# Patient Record
Sex: Female | Born: 1935
Health system: Southern US, Community
[De-identification: ages and names within clinical notes are randomized; demographics above are authoritative.]

## PROBLEM LIST (undated history)

## (undated) DIAGNOSIS — Z9889 Other specified postprocedural states: Secondary | ICD-10-CM

## (undated) DIAGNOSIS — G2581 Restless legs syndrome: Secondary | ICD-10-CM

## (undated) DIAGNOSIS — I10 Essential (primary) hypertension: Secondary | ICD-10-CM

## (undated) DIAGNOSIS — E785 Hyperlipidemia, unspecified: Secondary | ICD-10-CM

## (undated) DIAGNOSIS — N83209 Unspecified ovarian cyst, unspecified side: Secondary | ICD-10-CM

## (undated) DIAGNOSIS — I5030 Unspecified diastolic (congestive) heart failure: Secondary | ICD-10-CM

## (undated) DIAGNOSIS — R0989 Other specified symptoms and signs involving the circulatory and respiratory systems: Secondary | ICD-10-CM

## (undated) DIAGNOSIS — H269 Unspecified cataract: Secondary | ICD-10-CM

## (undated) DIAGNOSIS — I4891 Unspecified atrial fibrillation: Secondary | ICD-10-CM

## (undated) DIAGNOSIS — I340 Nonrheumatic mitral (valve) insufficiency: Secondary | ICD-10-CM

## (undated) DIAGNOSIS — N200 Calculus of kidney: Secondary | ICD-10-CM

## (undated) DIAGNOSIS — K639 Disease of intestine, unspecified: Secondary | ICD-10-CM

## (undated) DIAGNOSIS — R9431 Abnormal electrocardiogram [ECG] [EKG]: Secondary | ICD-10-CM

## (undated) HISTORY — DX: Restless legs syndrome: G25.81

## (undated) HISTORY — PX: CATARACT EXTRACTION: SUR2

## (undated) HISTORY — DX: Hyperlipidemia, unspecified: E78.5

## (undated) HISTORY — DX: Disease of intestine, unspecified: K63.9

## (undated) HISTORY — PX: ABDOMINAL ADHESION SURGERY: SHX90

## (undated) HISTORY — DX: Unspecified cataract: H26.9

## (undated) HISTORY — DX: Essential (primary) hypertension: I10

## (undated) HISTORY — PX: EYE SURGERY: SHX253

## (undated) HISTORY — PX: COLON SURGERY: SHX602

## (undated) HISTORY — DX: Other specified postprocedural states: Z98.890

## (undated) HISTORY — PX: PACEMAKER INSERTION: SHX728

## (undated) HISTORY — DX: Calculus of kidney: N20.0

## (undated) HISTORY — DX: Unspecified ovarian cyst, unspecified side: N83.209

## (undated) HISTORY — DX: Unspecified diastolic (congestive) heart failure: I50.30

## (undated) HISTORY — DX: Unspecified atrial fibrillation: I48.91

## (undated) HISTORY — DX: Abnormal electrocardiogram (ECG) (EKG): R94.31

## (undated) HISTORY — DX: Other specified symptoms and signs involving the circulatory and respiratory systems: R09.89

## (undated) HISTORY — DX: Nonrheumatic mitral (valve) insufficiency: I34.0

## (undated) HISTORY — PX: CYSTECTOMY: SUR359

---

## 1998-06-04 ENCOUNTER — Inpatient Hospital Stay (HOSPITAL_COMMUNITY): Admission: AD | Admit: 1998-06-04 | Discharge: 1998-06-11 | Payer: Self-pay | Admitting: Cardiology

## 1998-07-10 ENCOUNTER — Ambulatory Visit (HOSPITAL_COMMUNITY): Admission: RE | Admit: 1998-07-10 | Discharge: 1998-07-10 | Payer: Self-pay | Admitting: Cardiology

## 1999-09-10 ENCOUNTER — Inpatient Hospital Stay (HOSPITAL_COMMUNITY): Admission: EM | Admit: 1999-09-10 | Discharge: 1999-09-15 | Payer: Self-pay | Admitting: Emergency Medicine

## 1999-09-10 ENCOUNTER — Encounter: Payer: Self-pay | Admitting: Emergency Medicine

## 1999-09-11 ENCOUNTER — Encounter: Payer: Self-pay | Admitting: Family Medicine

## 1999-09-20 ENCOUNTER — Encounter: Admission: RE | Admit: 1999-09-20 | Discharge: 1999-09-20 | Payer: Self-pay | Admitting: Family Medicine

## 2000-11-12 ENCOUNTER — Inpatient Hospital Stay (HOSPITAL_COMMUNITY): Admission: RE | Admit: 2000-11-12 | Discharge: 2000-11-14 | Payer: Self-pay | Admitting: Internal Medicine

## 2000-11-12 ENCOUNTER — Encounter: Payer: Self-pay | Admitting: Internal Medicine

## 2000-11-13 ENCOUNTER — Encounter: Payer: Self-pay | Admitting: Internal Medicine

## 2001-03-05 ENCOUNTER — Encounter: Payer: Self-pay | Admitting: Family Medicine

## 2001-03-05 ENCOUNTER — Ambulatory Visit (HOSPITAL_COMMUNITY): Admission: RE | Admit: 2001-03-05 | Discharge: 2001-03-05 | Payer: Self-pay | Admitting: Family Medicine

## 2002-02-01 ENCOUNTER — Encounter: Payer: Self-pay | Admitting: *Deleted

## 2002-02-01 ENCOUNTER — Emergency Department (HOSPITAL_COMMUNITY): Admission: EM | Admit: 2002-02-01 | Discharge: 2002-02-01 | Payer: Self-pay | Admitting: *Deleted

## 2002-05-24 ENCOUNTER — Ambulatory Visit (HOSPITAL_COMMUNITY): Admission: RE | Admit: 2002-05-24 | Discharge: 2002-05-24 | Payer: Self-pay | Admitting: Gastroenterology

## 2002-05-24 ENCOUNTER — Encounter (INDEPENDENT_AMBULATORY_CARE_PROVIDER_SITE_OTHER): Payer: Self-pay | Admitting: Specialist

## 2002-07-28 HISTORY — PX: JOINT REPLACEMENT: SHX530

## 2002-08-10 ENCOUNTER — Encounter: Payer: Self-pay | Admitting: Orthopaedic Surgery

## 2002-08-16 ENCOUNTER — Inpatient Hospital Stay (HOSPITAL_COMMUNITY): Admission: RE | Admit: 2002-08-16 | Discharge: 2002-08-19 | Payer: Self-pay | Admitting: Orthopaedic Surgery

## 2002-08-16 ENCOUNTER — Encounter: Payer: Self-pay | Admitting: Orthopaedic Surgery

## 2002-08-19 ENCOUNTER — Inpatient Hospital Stay (HOSPITAL_COMMUNITY)
Admission: RE | Admit: 2002-08-19 | Discharge: 2002-08-26 | Payer: Self-pay | Admitting: Physical Medicine & Rehabilitation

## 2004-06-12 ENCOUNTER — Ambulatory Visit: Payer: Self-pay | Admitting: Cardiology

## 2004-07-08 ENCOUNTER — Ambulatory Visit (HOSPITAL_COMMUNITY): Admission: RE | Admit: 2004-07-08 | Discharge: 2004-07-08 | Payer: Self-pay | Admitting: Gastroenterology

## 2004-08-24 ENCOUNTER — Ambulatory Visit: Payer: Self-pay | Admitting: Internal Medicine

## 2004-09-26 ENCOUNTER — Ambulatory Visit: Payer: Self-pay | Admitting: Cardiology

## 2004-10-04 ENCOUNTER — Ambulatory Visit: Payer: Self-pay

## 2004-10-22 ENCOUNTER — Ambulatory Visit: Payer: Self-pay | Admitting: Internal Medicine

## 2005-02-10 ENCOUNTER — Ambulatory Visit: Payer: Self-pay

## 2005-05-08 ENCOUNTER — Ambulatory Visit: Payer: Self-pay | Admitting: Internal Medicine

## 2005-08-11 ENCOUNTER — Ambulatory Visit: Payer: Self-pay | Admitting: Internal Medicine

## 2005-11-10 ENCOUNTER — Ambulatory Visit: Payer: Self-pay | Admitting: Internal Medicine

## 2006-01-30 ENCOUNTER — Ambulatory Visit: Payer: Self-pay | Admitting: Internal Medicine

## 2006-04-01 ENCOUNTER — Ambulatory Visit: Payer: Self-pay | Admitting: Cardiology

## 2006-05-01 ENCOUNTER — Ambulatory Visit: Payer: Self-pay | Admitting: Internal Medicine

## 2006-05-04 ENCOUNTER — Ambulatory Visit: Payer: Self-pay

## 2006-05-29 ENCOUNTER — Ambulatory Visit: Payer: Self-pay | Admitting: Internal Medicine

## 2006-06-26 ENCOUNTER — Ambulatory Visit: Payer: Self-pay | Admitting: Internal Medicine

## 2006-07-20 ENCOUNTER — Ambulatory Visit: Payer: Self-pay | Admitting: Internal Medicine

## 2006-08-21 ENCOUNTER — Ambulatory Visit: Payer: Self-pay | Admitting: Internal Medicine

## 2006-09-18 ENCOUNTER — Ambulatory Visit: Payer: Self-pay | Admitting: Internal Medicine

## 2006-10-16 ENCOUNTER — Ambulatory Visit: Payer: Self-pay | Admitting: Internal Medicine

## 2006-11-13 ENCOUNTER — Ambulatory Visit: Payer: Self-pay | Admitting: Internal Medicine

## 2006-12-11 ENCOUNTER — Ambulatory Visit: Payer: Self-pay | Admitting: Internal Medicine

## 2007-01-08 ENCOUNTER — Ambulatory Visit: Payer: Self-pay | Admitting: Internal Medicine

## 2007-02-10 ENCOUNTER — Ambulatory Visit: Payer: Self-pay | Admitting: Internal Medicine

## 2007-03-05 ENCOUNTER — Ambulatory Visit: Payer: Self-pay | Admitting: Internal Medicine

## 2007-04-02 ENCOUNTER — Ambulatory Visit: Payer: Self-pay | Admitting: Internal Medicine

## 2007-04-30 ENCOUNTER — Ambulatory Visit: Payer: Self-pay | Admitting: Internal Medicine

## 2007-05-28 ENCOUNTER — Ambulatory Visit: Payer: Self-pay | Admitting: Internal Medicine

## 2007-06-25 ENCOUNTER — Ambulatory Visit: Payer: Self-pay | Admitting: Internal Medicine

## 2007-07-23 ENCOUNTER — Ambulatory Visit: Payer: Self-pay | Admitting: Internal Medicine

## 2007-08-04 ENCOUNTER — Ambulatory Visit: Payer: Self-pay | Admitting: Cardiology

## 2007-11-19 ENCOUNTER — Ambulatory Visit: Payer: Self-pay | Admitting: Internal Medicine

## 2008-02-18 ENCOUNTER — Ambulatory Visit: Payer: Self-pay | Admitting: Internal Medicine

## 2008-04-14 ENCOUNTER — Ambulatory Visit: Payer: Self-pay | Admitting: Internal Medicine

## 2008-07-15 ENCOUNTER — Ambulatory Visit: Payer: Self-pay | Admitting: Internal Medicine

## 2008-07-28 HISTORY — PX: BREAST SURGERY: SHX581

## 2008-08-02 ENCOUNTER — Ambulatory Visit: Payer: Self-pay | Admitting: Cardiology

## 2008-08-17 DIAGNOSIS — I10 Essential (primary) hypertension: Secondary | ICD-10-CM | POA: Insufficient documentation

## 2008-08-17 DIAGNOSIS — I4891 Unspecified atrial fibrillation: Secondary | ICD-10-CM

## 2008-08-17 DIAGNOSIS — I442 Atrioventricular block, complete: Secondary | ICD-10-CM

## 2008-08-17 DIAGNOSIS — Z950 Presence of cardiac pacemaker: Secondary | ICD-10-CM

## 2008-10-16 ENCOUNTER — Ambulatory Visit: Payer: Self-pay | Admitting: Internal Medicine

## 2008-11-14 ENCOUNTER — Encounter: Payer: Self-pay | Admitting: Internal Medicine

## 2009-01-15 ENCOUNTER — Ambulatory Visit: Payer: Self-pay | Admitting: Internal Medicine

## 2009-03-14 ENCOUNTER — Telehealth: Payer: Self-pay | Admitting: Internal Medicine

## 2009-04-03 ENCOUNTER — Ambulatory Visit: Payer: Self-pay | Admitting: Internal Medicine

## 2009-04-25 ENCOUNTER — Telehealth: Payer: Self-pay | Admitting: Cardiology

## 2009-04-27 ENCOUNTER — Encounter: Payer: Self-pay | Admitting: Internal Medicine

## 2009-05-24 ENCOUNTER — Encounter (INDEPENDENT_AMBULATORY_CARE_PROVIDER_SITE_OTHER): Payer: Self-pay | Admitting: General Surgery

## 2009-05-24 ENCOUNTER — Ambulatory Visit (HOSPITAL_COMMUNITY): Admission: RE | Admit: 2009-05-24 | Discharge: 2009-05-24 | Payer: Self-pay | Admitting: General Surgery

## 2009-07-04 ENCOUNTER — Ambulatory Visit: Payer: Self-pay | Admitting: Internal Medicine

## 2009-08-01 ENCOUNTER — Ambulatory Visit: Payer: Self-pay | Admitting: Cardiology

## 2009-08-01 DIAGNOSIS — I080 Rheumatic disorders of both mitral and aortic valves: Secondary | ICD-10-CM | POA: Insufficient documentation

## 2009-10-03 ENCOUNTER — Ambulatory Visit: Payer: Self-pay | Admitting: Internal Medicine

## 2010-01-02 ENCOUNTER — Ambulatory Visit: Payer: Self-pay | Admitting: Internal Medicine

## 2010-04-03 ENCOUNTER — Ambulatory Visit: Payer: Self-pay | Admitting: Internal Medicine

## 2010-05-24 ENCOUNTER — Encounter (INDEPENDENT_AMBULATORY_CARE_PROVIDER_SITE_OTHER): Payer: Self-pay | Admitting: *Deleted

## 2010-07-09 ENCOUNTER — Ambulatory Visit: Payer: Self-pay | Admitting: Internal Medicine

## 2010-08-21 ENCOUNTER — Encounter: Payer: Self-pay | Admitting: Cardiology

## 2010-08-21 ENCOUNTER — Ambulatory Visit: Admission: RE | Admit: 2010-08-21 | Discharge: 2010-08-21 | Payer: Self-pay | Source: Home / Self Care

## 2010-08-21 DIAGNOSIS — R0989 Other specified symptoms and signs involving the circulatory and respiratory systems: Secondary | ICD-10-CM | POA: Insufficient documentation

## 2010-08-27 NOTE — Assessment & Plan Note (Signed)
Summary: [facility]   Visit Type:  Follow-up Primary Provider:  Dr. Vernon Prey  CC:  Atrial Fibrillation and MR.  History of Present Illness: The patient presents for followup of the above. Since I last saw her she has done well. She did have a breast lumpectomy for treatment of cancer. She has required no further therapy for this. She has been followed in the device clinic. She denies any chest pressure, neck or arm discomfort. She has felt no palpitations and has had no presyncope or syncope. She denies any shortness of breath and has had no PND or orthopnea. She complains of generalized fatigue. Of note her sister died this morning after a chronic illness.  Current Medications (verified): 1)  Warfarin Sodium 5 Mg Tabs (Warfarin Sodium) .... Take As Directed 2)  Furosemide 20 Mg Tabs (Furosemide) .... Once Daily 3)  Cozaar 25 Mg Tabs (Losartan Potassium) .... Take One Tablet Once Daily 4)  Vitamin D3 1000 Unit Tabs (Cholecalciferol) .... 2 By Mouth Daily  Allergies (verified): No Known Drug Allergies  Past History:  Past Medical History: Atrial Fibrillation Hypertension Heart failure-diastolic Carotid bruit Ablation-AV Node (S/P) Hyperlipidemia Colonic adhesions Ovarian cyst QT prolongation with sotalol Nephrolithiasis Moderate mitral regurgitation  Past Surgical History: Status post pacemaker-St. Jude Resection of ovarian cyst Lysis of abdominal adhesions Colonic obstruction secondary to diverticulitis with resection Cataract surgery bilaterally  Review of Systems       As stated in the HPI and negative for all other systems.   Vital Signs:  Patient profile:   77 year old female Height:      66 inches Weight:      143 pounds Pulse rate:   70 / minute Resp:     16 per minute BP sitting:   126 / 70  (right arm)  Vitals Entered By: Marrion Coy, CNA (August 01, 2009 10:26 AM)  Physical Exam  General:  Well developed, well nourished, in no acute  distress. Head:  normocephalic and atraumatic Eyes:  PERRLA/EOM intact; conjunctiva and lids normal. Mouth:  Teeth, gums and palate normal. Oral mucosa normal. Neck:  Neck supple, no JVD. No masses, thyromegaly or abnormal cervical nodes. Chest Wall:  no deformities or breast masses noted Lungs:  Clear bilaterally to auscultation and percussion. Abdomen:  Bowel sounds positive; abdomen soft and non-tender without masses, organomegaly, or hernias noted. No hepatosplenomegaly. Msk:  Back normal, normal gait. Muscle strength and tone normal. Extremities:  No clubbing or cyanosis. Neurologic:  Alert and oriented x 3. Skin:  Intact without lesions or rashes. Cervical Nodes:  no significant adenopathy Axillary Nodes:  no significant adenopathy Inguinal Nodes:  no significant adenopathy Psych:  Normal affect.   Detailed Cardiovascular Exam  Neck    Carotids: Carotids full and equal bilaterally without bruits.      Neck Veins: Normal, no JVD.    Heart    Inspection: no deformities or lifts noted.      Palpation: normal PMI with no thrills palpable.      Auscultation: S1 and S2 within normal limits, no S3, no clicks, no rubs, 3/6 murmur heard at the apex and radiating up the aortic outflow tract as well as slightly to the axilla, no diastolic murmurs  Vascular    Abdominal Aorta: no palpable masses, pulsations, or audible bruits.      Femoral Pulses: normal femoral pulses bilaterally.      Pedal Pulses: normal pedal pulses bilaterally.      Radial Pulses:  normal radial pulses bilaterally.      Peripheral Circulation: no clubbing, cyanosis, or edema noted with normal capillary refill.     EKG  Procedure date:  08/01/2009  Findings:      atrial fibrillation, ventricular pacing 100% capture with normal magnet function  PPM Specifications Following MD:  Sherryl Manges, MD     PPM Vendor:  [employer] Jude     PPM Model Number:  [id number]     PPM Serial Number:  [id number] PPM DOI:  11/12/2000     PPM  Implanting MD:  Sherryl Manges, MD  Lead 1    Location: [facility]     DOI: 11/12/2000     Model #: [id number]     Serial #: [id number]     Status: active   Indications:  AFIB WITH ABLATION   PPM Follow Up Pacer Dependent:  Yes      Episodes Coumadin:  Yes  Parameters Mode:  VVIR     Lower Rate Limit:  70     Upper Rate Limit:  120  Impression & Recommendations:  Problem # 1:  MITRAL REGURGITATION (ICD-396.3) The patient had moderate mitral regurgitation on an echo a few years ago. We had a discussion about this. Given her advanced age she would not consent to surgery to repair this most likely. She is not having any symptoms. Therefore, we will manage this expectantly with no further imaging at this time.  Problem # 2:  ATRIAL FIBRILLATION (ICD-427.31) She tolerates Coumadin and has a pacemaker for management of her rate. I will make no change to her regimen. She has no contraindications to the Coumadin. Orders: EKG w/ Interpretation (93000)  Problem # 3:  HYPERTENSION, BENIGN (ICD-401.1) Her blood pressure is controlled and she will continue the meds as listed.  Problem # 4:  PACEMAKER (ICD-V45.Marland Kitchen01) She he is up-to-date on followup.  Patient Instructions: 1)  Your physician recommends that you schedule a follow-up appointment in: 1 year with Dr Antoine Poche in [place] 2)  Your physician recommends that you continue on your current medications as directed. Please refer to the Current Medication list given to you today.

## 2010-08-27 NOTE — Cardiovascular Report (Signed)
Summary: TTM   TTM   Imported By: Roderic Ovens 08/17/2009 14:57:30  _____________________________________________________________________  External Attachment:    Type:   Image     Comment:   External Document

## 2010-08-27 NOTE — Cardiovascular Report (Signed)
Summary: TTM   TTM   Imported By: Roderic Ovens 02/04/2010 14:51:35  _____________________________________________________________________  External Attachment:    Type:   Image     Comment:   External Document

## 2010-08-27 NOTE — Cardiovascular Report (Signed)
Summary: TTM  TTM   Imported By: Roderic Ovens 10/17/2009 16:17:09  _____________________________________________________________________  External Attachment:    Type:   Image     Comment:   External Document

## 2010-08-27 NOTE — Miscellaneous (Signed)
Summary: dx correction   Clinical Lists Changes  Problems: Changed problem from PACEMAKER (ICD-V45..01) to PACEMAKER, PERMANENT (ICD-V45.01)  changed the incorrect dx code to correct dx code Donna Keene  May 24, 2010 12:51 PM 

## 2010-08-27 NOTE — Cardiovascular Report (Signed)
Summary: TTM   TTM   Imported By: Roderic Ovens 04/17/2010 11:51:53  _____________________________________________________________________  External Attachment:    Type:   Image     Comment:   External Document

## 2010-08-29 ENCOUNTER — Telehealth (INDEPENDENT_AMBULATORY_CARE_PROVIDER_SITE_OTHER): Payer: Self-pay | Admitting: *Deleted

## 2010-08-29 NOTE — Assessment & Plan Note (Signed)
Summary: pc2/sl   Visit Type:  PPM-St. Jude Primary Provider:  Dr. Vernon Prey  CC:  no complaints.  History of Present Illness: [name] is  seen in followup for atrial fibrillation with an uncontrolled ventricular response for which she underwent AV junction ablation and pacemaker implantation.   she continues to do very well cooking daily keeping busy with her housework with limited symptoms.  Problems Prior to Update: 1)  Mitral Regurgitation  (ICD-396.3) 2)  Hypertension, Benign  (ICD-401.1) 3)  Pacemaker, Permanent  (ICD-V45.01) 4)  Atrial Fibrillation  (ICD-427.31) 5)  Av Block, Complete  (ICD-426.0)  Current Medications (verified): 1)  Warfarin Sodium 5 Mg Tabs (Warfarin Sodium) .... Take As Directed 2)  Furosemide 20 Mg Tabs (Furosemide) .... Once Daily 3)  Cozaar 25 Mg Tabs (Losartan Potassium) .... Take One Tablet Once Daily 4)  Vitamin D3 1000 Unit Tabs (Cholecalciferol) .... 2 By Mouth Daily  Allergies (verified): No Known Drug Allergies  Past History:  Past Medical History: Last updated: 08/01/2009 Atrial Fibrillation Hypertension Heart failure-diastolic Carotid bruit Ablation-AV Node (S/P) Hyperlipidemia Colonic adhesions Ovarian cyst QT prolongation with sotalol Nephrolithiasis Moderate mitral regurgitation  Past Surgical History: Last updated: 08/01/2009 Status post pacemaker-St. Jude Resection of ovarian cyst Lysis of abdominal adhesions Colonic obstruction secondary to diverticulitis with resection Cataract surgery bilaterally  Family History: Last updated: 08/17/2008 not available  Social History: Last updated: 08/17/2008 Retired  Tobacco Use - No.   Risk Factors: Smoking Status: never (08/17/2008)  Vital Signs:  Patient profile:   77 year old female Height:      66 inches Weight:      142.50 pounds BMI:     23.08 Pulse rate:   69 / minute BP sitting:   137 / 74  (left arm) Cuff size:   regular  Vitals Entered By: Caralee Ates CMA (July 09, 2010 9:09 AM)  Physical Exam  General:  The patient was alert and oriented in no acute distress. HEENT Normal.  Neck veins were flat, carotids were somewhat delayed Lungs were clear.  Heart sounds were regular with 2/6 systoloic mumrr Abdomen was soft with active bowel sounds. There is no clubbing cyanosis or edema. Skin Warm and dry    PPM Specifications Following MD:  Sherryl Manges, MD     PPM Vendor:  St Jude     PPM Model Number:  [id number]     PPM Serial Number:  [id number] PPM DOI:  11/12/2000     PPM Implanting MD:  Sherryl Manges, MD  Lead 1    Location: [facility]     DOI: 11/12/2000     Model #: [id number]     Serial #: [id number]     Status: active   Indications:  AFIB WITH ABLATION   PPM Follow Up Remote Check?  No Battery Voltage:  2.73 V     Pacer Dependent:  Yes     Right Ventricle  Impedance: 437 ohms, Threshold: 0.625 V at 0.4 msec Auto Capture ER/Polarity: 12.29  Episodes Coumadin:  Yes  Parameters Mode:  VVIR     Lower Rate Limit:  70     Upper Rate Limit:  120 Next Cardiology Appt Due:  06/28/2011 Tech Comments:  No parameter changes.  Device function normal.  TTM's with [facility].  ROV 1 year with Dr. Graciela Husbands. Altha Harm, LPN  July 09, 2010 9:41 AM    Impression & Recommendations:  Problem # 1:  MITRAL REGURGITATION (ICD-396.3) we will paln to check  an echo in 6 months at [facility]  to r/o aoritc stenosis   she is without symptoms at this time  Problem # 2:  PACEMAKER, PERMANENT (ICD-V45.01) Device parameters and data were reviewed and no changes were made;  cell impedance >2  will check on estimated longeveity  Problem # 3:  AV BLOCK, COMPLETE (ICD-426.0) stable Her updated medication list for this problem includes:    Warfarin Sodium 5 Mg Tabs (Warfarin sodium) .Marland Kitchen... Take as directed  Problem # 4:  ATRIAL FIBRILLATION (ICD-427.31) on warfain Her updated medication list for this problem includes:    Warfarin Sodium 5 Mg Tabs (Warfarin sodium)  .Marland Kitchen... Take as directed  Appended Document: pc2/sl    Clinical Lists Changes  Observations: Added new observation of PI CARDIO: Your physician recommends that you continue on your current medications as directed. Please refer to the Current Medication list given to you today. Your physician wants you to follow-up in:  6 months You will receive a reminder letter in the mail two months in advance. If you don't receive a letter, please call our office to schedule the follow-up appointment. (07/09/2010 9:48)       Patient Instructions: 1)  Your physician recommends that you continue on your current medications as directed. Please refer to the Current Medication list given to you today. 2)  Your physician wants you to follow-up in:  6 months You will receive a reminder letter in the mail two months in advance. If you don't receive a letter, please call our office to schedule the follow-up appointment.

## 2010-08-29 NOTE — Cardiovascular Report (Signed)
Summary: Office Visit   Office Visit   Imported By: Roderic Ovens 07/10/2010 16:17:29  _____________________________________________________________________  External Attachment:    Type:   Image     Comment:   External Document

## 2010-08-29 NOTE — Assessment & Plan Note (Signed)
Summary: [facility]   Visit Type:  Follow-up Primary Provider:  Dr. Vernon Prey  CC:  Atrial fibrillation.  History of Present Illness: The patient presents for followup. Since I last saw her she has had no new cardiovascular complaints. She continues to do minimal household chores on a few stairs and sleeping. With this she denies any new symptoms that she has some chronic dyspnea and she has no PND or orthopnea. She doesn't notice any palpitations and has had no presyncope or syncope. She denies any chest pressure, neck or arm discomfort. She has had no weight gain or edema. She tolerates anticoagulation.  Current Medications (verified): 1)  Warfarin Sodium 5 Mg Tabs (Warfarin Sodium) .... Take As Directed 2)  Furosemide 20 Mg Tabs (Furosemide) .... Once Daily 3)  Cozaar 25 Mg Tabs (Losartan Potassium) .... Take One Tablet Once Daily 4)  Vitamin D3 1000 Unit Tabs (Cholecalciferol) .... 2 By Mouth Daily 5)  Vitamin C 500 Mg Tabs (Ascorbic Acid) .Marland Kitchen.. 1 By Mouth Daily 6)  B Complex  Tabs (B Complex Vitamins) .Marland Kitchen.. 1 By Mouth Daily  Allergies (verified): No Known Drug Allergies  Past History:  Past Medical History: Reviewed history from 08/01/2009 and no changes required. Atrial Fibrillation Hypertension Heart failure-diastolic Carotid bruit Ablation-AV Node (S/P) Hyperlipidemia Colonic adhesions Ovarian cyst QT prolongation with sotalol Nephrolithiasis Moderate mitral regurgitation  Past Surgical History: Reviewed history from 08/01/2009 and no changes required. Status post pacemaker-St. Jude Resection of ovarian cyst Lysis of abdominal adhesions Colonic obstruction secondary to diverticulitis with resection Cataract surgery bilaterally  Review of Systems       As stated in the HPI and negative for all other systems.   Vital Signs:  Patient profile:   77 year old female Height:      66 inches Weight:      148 pounds BMI:     23.97 Pulse rate:   69 /  minute Resp:     16 per minute BP sitting:   126 / 70  (right arm)  Vitals Entered By: Marrion Coy, CNA (August 21, 2010 9:49 AM)  Physical Exam  General:  Well developed, well nourished, in no acute distress. Head:  normocephalic and atraumatic Eyes:  PERRLA/EOM intact; conjunctiva and lids normal. Mouth:  Teeth, gums and palate normal. Oral mucosa normal. Neck:  Neck supple, no JVD. No masses, thyromegaly or abnormal cervical nodes. Chest Wall:  no deformities, well-healed pacemaker pocket Lungs:  Clear bilaterally to auscultation and percussion. Abdomen:  Bowel sounds positive; abdomen soft and non-tender without masses, organomegaly, or hernias noted. No hepatosplenomegaly. Msk:  Back normal, normal gait. Muscle strength and tone normal. Extremities:  No clubbing or cyanosis. Neurologic:  Alert and oriented x 3. Skin:  Intact without lesions or rashes. Cervical Nodes:  no significant adenopathy Axillary Nodes:  no significant adenopathy Inguinal Nodes:  no significant adenopathy Psych:  Normal affect.   Detailed Cardiovascular Exam  Neck    Carotids: neck bilateral carotid bruits versus transmitted systolic murmur    Neck Veins: Normal, no JVD.    Heart    Inspection: no deformities or lifts noted.      Palpation: normal PMI with no thrills palpable.      Auscultation: S1 and S2 within normal, no S3, no S4, axillary or holosystolic murmur radiating from the apex, also systolic murmur radiating up the aortic outflow tract and mid peaking, no diastolic murmurs.  Vascular    Abdominal Aorta: no palpable masses, pulsations, or  audible bruits.      Femoral Pulses: normal femoral pulses bilaterally.      Pedal Pulses: normal pedal pulses bilaterally.      Radial Pulses: normal radial pulses bilaterally.      Peripheral Circulation: no clubbing, cyanosis, or edema noted with normal capillary refill.     EKG  Procedure date:  08/21/2010  Findings:      Atrial  fibrillation with ventricular pacing  PPM Specifications Following MD:  Sherryl Manges, MD     PPM Vendor:  St Jude     PPM Model Number:  [id number]     PPM Serial Number:  [id number] PPM DOI:  11/12/2000     PPM Implanting MD:  Sherryl Manges, MD  Lead 1    Location: [facility]     DOI: 11/12/2000     Model #: [id number]     Serial #: [id number]     Status: active   Indications:  AFIB WITH ABLATION   PPM Follow Up Pacer Dependent:  Yes      Episodes Coumadin:  Yes  Parameters Mode:  VVIR     Lower Rate Limit:  70     Upper Rate Limit:  120  Impression & Recommendations:  Problem # 1:  MITRAL REGURGITATION (ICD-396.3) As apposed to the previous conversation, she now says that she would want valve surgery if she had severe MR and was otherwise a candidate.  Therefore,  I will schedule an echo.  I cannot find the last echo which was some years ago. Orders: Echocardiogram (Echo)  Problem # 2:  CAROTID BRUIT (ICD-785.9) I will check carotid dopplers. Orders: Carotid Duplex (Carotid Duplex)  Problem # 3:  ATRIAL FIBRILLATION (ICD-427.31) She tolerate Coumadi and he is paced with AV ablation. Orders: EKG w/ Interpretation (93000)  Problem # 4:  HYPERTENSION, BENIGN (ICD-401.1) Her blood pressure is controlled. Orders: Echocardiogram (Echo)  Patient Instructions: 1)  Your physician recommends that you schedule a follow-up appointment in: 6 months with Dr Antoine Poche in [place] 2)  Your physician recommends that you continue on your current medications as directed. Please refer to the Current Medication list given to you today. 3)  Your physician has requested that you have a carotid duplex. This test is an ultrasound of the carotid arteries in your neck. It looks at blood flow through these arteries that supply the brain with blood. Allow one hour for this exam. There are no restrictions or special instructions. 4)  Your physician has requested that you have an echocardiogram.  Echocardiography is a  painless test that uses sound waves to create images of your heart. It provides your doctor with information about the size and shape of your heart and how well your heart's chambers and valves are working.  This procedure takes approximately one hour. There are no restrictions for this procedure.

## 2010-08-30 ENCOUNTER — Ambulatory Visit (HOSPITAL_COMMUNITY): Payer: Medicare Other | Attending: Cardiology

## 2010-08-30 ENCOUNTER — Encounter: Payer: Self-pay | Admitting: Cardiology

## 2010-08-30 ENCOUNTER — Encounter (INDEPENDENT_AMBULATORY_CARE_PROVIDER_SITE_OTHER): Payer: Medicare Other

## 2010-08-30 DIAGNOSIS — I079 Rheumatic tricuspid valve disease, unspecified: Secondary | ICD-10-CM | POA: Insufficient documentation

## 2010-08-30 DIAGNOSIS — I509 Heart failure, unspecified: Secondary | ICD-10-CM | POA: Insufficient documentation

## 2010-08-30 DIAGNOSIS — I4891 Unspecified atrial fibrillation: Secondary | ICD-10-CM | POA: Insufficient documentation

## 2010-08-30 DIAGNOSIS — E785 Hyperlipidemia, unspecified: Secondary | ICD-10-CM | POA: Insufficient documentation

## 2010-08-30 DIAGNOSIS — I6529 Occlusion and stenosis of unspecified carotid artery: Secondary | ICD-10-CM

## 2010-08-30 DIAGNOSIS — I059 Rheumatic mitral valve disease, unspecified: Secondary | ICD-10-CM | POA: Insufficient documentation

## 2010-08-30 DIAGNOSIS — I10 Essential (primary) hypertension: Secondary | ICD-10-CM | POA: Insufficient documentation

## 2010-08-30 DIAGNOSIS — R0989 Other specified symptoms and signs involving the circulatory and respiratory systems: Secondary | ICD-10-CM

## 2010-08-30 DIAGNOSIS — I379 Nonrheumatic pulmonary valve disorder, unspecified: Secondary | ICD-10-CM | POA: Insufficient documentation

## 2010-09-04 NOTE — Progress Notes (Signed)
Summary: Echo appt  Phone Note Outgoing Call Call back at [facility] Phone [phone]   Call placed by: Stanton Kidney, EMT-P,  August 29, 2010 2:43 PM Action Taken: Phone Call Completed Summary of Call: S/W patient, aware to arrive 30 min prior to appt. Stanton Kidney, EMT-P  August 29, 2010 2:43 PM

## 2010-09-05 ENCOUNTER — Other Ambulatory Visit (HOSPITAL_COMMUNITY): Payer: Self-pay

## 2010-09-05 ENCOUNTER — Encounter (HOSPITAL_COMMUNITY): Payer: Self-pay

## 2010-10-09 ENCOUNTER — Encounter: Payer: Self-pay | Admitting: Internal Medicine

## 2010-10-09 DIAGNOSIS — I4891 Unspecified atrial fibrillation: Secondary | ICD-10-CM

## 2010-10-12 DIAGNOSIS — G2581 Restless legs syndrome: Secondary | ICD-10-CM | POA: Insufficient documentation

## 2010-10-14 ENCOUNTER — Other Ambulatory Visit: Payer: Self-pay | Admitting: Orthopaedic Surgery

## 2010-10-14 DIAGNOSIS — M545 Low back pain: Secondary | ICD-10-CM

## 2010-10-14 DIAGNOSIS — M25551 Pain in right hip: Secondary | ICD-10-CM

## 2010-10-18 ENCOUNTER — Ambulatory Visit
Admission: RE | Admit: 2010-10-18 | Discharge: 2010-10-18 | Disposition: A | Discharge status: Home / Self Care | Payer: Medicare Other | Source: Ambulatory Visit | Attending: Orthopaedic Surgery | Admitting: Orthopaedic Surgery

## 2010-10-18 DIAGNOSIS — M545 Low back pain, unspecified: Secondary | ICD-10-CM

## 2010-10-18 DIAGNOSIS — M25551 Pain in right hip: Secondary | ICD-10-CM

## 2010-10-28 ENCOUNTER — Ambulatory Visit: Payer: Medicare Other | Attending: Orthopaedic Surgery | Admitting: Physical Therapy

## 2010-10-28 DIAGNOSIS — M545 Low back pain, unspecified: Secondary | ICD-10-CM | POA: Insufficient documentation

## 2010-10-28 DIAGNOSIS — M2569 Stiffness of other specified joint, not elsewhere classified: Secondary | ICD-10-CM | POA: Insufficient documentation

## 2010-10-28 DIAGNOSIS — IMO0001 Reserved for inherently not codable concepts without codable children: Secondary | ICD-10-CM | POA: Insufficient documentation

## 2010-10-31 ENCOUNTER — Ambulatory Visit: Payer: Medicare Other | Admitting: Physical Therapy

## 2010-10-31 LAB — PROTIME-INR
INR: 1.24 (ref 0.00–1.49)
Prothrombin Time: 15.5 seconds — ABNORMAL HIGH (ref 11.6–15.2)

## 2010-10-31 LAB — DIFFERENTIAL
Basophils Absolute: 0 10*3/uL (ref 0.0–0.1)
Lymphocytes Relative: 43 % (ref 12–46)
Monocytes Absolute: 0.8 10*3/uL (ref 0.1–1.0)
Neutro Abs: 3.3 10*3/uL (ref 1.7–7.7)
Neutrophils Relative %: 45 % (ref 43–77)

## 2010-10-31 LAB — APTT: aPTT: 31 seconds (ref 24–37)

## 2010-10-31 LAB — BASIC METABOLIC PANEL
Calcium: 9.8 mg/dL (ref 8.4–10.5)
GFR calc Af Amer: >60 mL/min (ref >60)
GFR calc non Af Amer: >60 mL/min (ref >60)
Glucose, Bld: 102 mg/dL — ABNORMAL HIGH (ref 70–99)
Sodium: 136 mEq/L (ref 135–145)

## 2010-10-31 LAB — CBC
Hemoglobin: 11.4 g/dL — ABNORMAL LOW (ref 12.0–15.0)
RDW: 14.5 % (ref 11.5–15.5)

## 2010-11-04 ENCOUNTER — Ambulatory Visit: Payer: Medicare Other | Admitting: Physical Therapy

## 2010-11-07 ENCOUNTER — Ambulatory Visit: Payer: Medicare Other | Admitting: Physical Therapy

## 2010-11-11 ENCOUNTER — Ambulatory Visit: Payer: Medicare Other | Admitting: Physical Therapy

## 2010-11-14 ENCOUNTER — Ambulatory Visit: Payer: Medicare Other | Admitting: Physical Therapy

## 2010-11-18 ENCOUNTER — Ambulatory Visit: Payer: Medicare Other | Admitting: Physical Therapy

## 2010-11-21 ENCOUNTER — Ambulatory Visit: Payer: Medicare Other | Admitting: Physical Therapy

## 2010-11-25 ENCOUNTER — Ambulatory Visit: Payer: Medicare Other | Admitting: Physical Therapy

## 2010-11-28 ENCOUNTER — Ambulatory Visit: Payer: Medicare Other | Attending: Orthopaedic Surgery | Admitting: Physical Therapy

## 2010-11-28 DIAGNOSIS — IMO0001 Reserved for inherently not codable concepts without codable children: Secondary | ICD-10-CM | POA: Insufficient documentation

## 2010-11-28 DIAGNOSIS — M545 Low back pain, unspecified: Secondary | ICD-10-CM | POA: Insufficient documentation

## 2010-11-28 DIAGNOSIS — M2569 Stiffness of other specified joint, not elsewhere classified: Secondary | ICD-10-CM | POA: Insufficient documentation

## 2010-12-02 ENCOUNTER — Ambulatory Visit: Payer: Medicare Other | Admitting: Physical Therapy

## 2010-12-05 ENCOUNTER — Ambulatory Visit: Payer: Medicare Other | Admitting: Physical Therapy

## 2010-12-09 ENCOUNTER — Ambulatory Visit: Payer: Medicare Other | Admitting: Physical Therapy

## 2010-12-10 NOTE — Assessment & Plan Note (Signed)
[facility]                            CARDIOLOGY OFFICE NOTE   SHACOYA, BURKHAMMER                      MRN:          [id number]  DATE:08/02/2008                            DOB:          [birth year]    PRIMARY CARE PHYSICIAN:  Ernestina Penna, MD   REASON FOR PRESENTATION:  Evaluate the patient with atrial fibrillation.   HISTORY OF PRESENT ILLNESS:  The patient is an 77 years old.  She  returns for yearly followup.  She did see Dr. Graciela Husbands in September for  pacemaker followup.  She did have some reprogramming of her device to  improve exercise performance.  Apparently, she has felt relatively well.  She is actually not complaining of shortness of breath that she was  having.  She is able to do some grocery shopping though she gets  fatigued.  She does not do much more than this.  She thinks she has good  quality of life though for her age.  She denies any resting shortness of  breath and does not have any PND or orthopnea.  She is not having any  chest discomfort, neck, or arm discomfort.  She is not having any  palpitations, presyncope, or syncope.   PAST MEDICAL HISTORY:  1. Permanent atrial fibrillation with AV nodal ablation.  2. Permanent pacemaker placement.  3. Dyslipidemia.  4. Hypertension.  5. Resection of ovarian cyst.  6. Colon surgery x2 for resection of adhesions.  7. Nephrolithiasis.  8. Osteoporosis.  9. Right total hip replacement.   REVIEW OF SYSTEMS:  As stated in the HPI and otherwise negative for  other systems.   PHYSICAL EXAMINATION:  GENERAL:  The patient is pleasant and in no  distress.  VITAL SIGNS:  Blood pressure 124/74, heart rate 70 and regular, weight  154 pounds, and body mass index 24.  HEENT:  Eyes unremarkable.  Pupils are equal, round, and reactive to  light.  Fundi not visualized.  Oral mucosa unremarkable.  NECK:  No jugular venous distention at 45 degrees, carotid upstroke  brisk and symmetric, no  bruits, no thyromegaly.  LYMPHATICS:  No adenopathy.  LUNGS:  Clear to auscultation bilaterally.  CHEST:  Well-healed pacemaker pocket.  HEART:  PMI not displaced or sustained, S1 and S2 within normal limits.  No S3.  A 2/6 axillary holosystolic murmur radiating slightly at the  left sternal border, no diastolic murmurs.  ABDOMEN:  Flat, positive bowel sounds, normal in frequency and pitch.  No bruits, no rebound, no guarding.  No midline pulsatile mass.  No  organomegaly.  SKIN:  No rashes.  No nodules.  EXTREMITIES:  Pulses 2+.  No edema.   EKG, atrial fibrillation with ventricular pacing 100% capture.   1. Atrial fibrillation.  The patient is tolerating the Coumadin.  She      tolerates the rhythm.  She has normal pacemaker function. I think      she may have had some improvement in her symptoms with whatever      change was put in place by Dr. Graciela Husbands.  At this time, no  further      cardiovascular testing is suggested.  She will continue on the meds      as listed.  2. Murmur.  The patient has a heart murmur which may represent her      mild tricuspid regurgitation on echo.  No further evaluation is      warranted, today she is asymptomatic and given her advance stage.  3. Dyspnea.  This seems to be improved as above.  She will continue on      the meds as listed.  4. Followup.  She was see Dr. Graciela Husbands in the fall and I will see her      again in January of next year when she is 26.     Rollene Rotunda, MD, [facility]  Electronically Signed    JH/MedQ  DD: 08/02/2008  DT: 08/03/2008  Job #: [id number]   cc:   Ernestina Penna, M.D.

## 2010-12-10 NOTE — Assessment & Plan Note (Signed)
[facility]                         ELECTROPHYSIOLOGY OFFICE NOTE   NAME:HOTLERMarvette, [name]                      MRN:          [id number]  DATE:02/10/2007                            DOB:          January 21, 1917    [name] comes in today.  She is status post AV ablation in the  setting of atrial fibrillation.  She has had a cough for the last couple  of weeks.  She has had no fever.  She has not taken her diuretic,  however, in the last 72 hours, and it turns out as we get to the end of  the day.   She has had a little bit of peripheral edema.   MEDICATIONS:  1. Furosemide 20, which she has not been taking as noted.  2. Cozaar 25.  3. Coumadin.   EXAMINATION:  She was afebrile at 98.4, her blood pressure was 122/74,  pulse was 72.  LUNGS:  Had bibasilar crackles.  NECK VEINS:  Flat.  HEART SOUNDS:  Regular.  EXTREMITIES:  Edema 1+.   Interrogation of her St. Jude Identity pulse generator demonstrates that  she had no intrinsic ventricular rhythm, the impedance was 462, the  AutoCapture was on, battery voltage was 2.75 with a cell impedance of  1.2 kilo ohms.   Her device was not reprogrammed.   IMPRESSION:  1. Atrial fibrillation with an uncontrolled ventricular response.  2. Complete heart block status post atrioventricular ablation.  3. Status post pacer for number 2.  4. Shortness of breath, cough; question bronchitis, question heart      failure.  5. I advised the patient to go back on her diuretic.  I have also      given her a prescription for Augmentin.  Because of potential      interactions with Coumadin, I have asked that she decrease her      Coumadin dose to 2.5 mg for the week that she is taking the      Augmentin.   Will plan to see her again in a year.  She is to follow up with Dr.  Christell Constant in the interim if she has any further problems.     Duke Salvia, MD, [facility]  Electronically Signed    SCK/MedQ  DD: 02/10/2007   DT: 02/11/2007  Job #: [id number]

## 2010-12-10 NOTE — Assessment & Plan Note (Signed)
[facility]                         ELECTROPHYSIOLOGY OFFICE NOTE   NAME:HOTLERYasemin, Rabon                      MRN:          [id number]  DATE:04/14/2008                            DOB:          02-Nov-1916    HISTORY OF PRESENT ILLNESS:  Ms. Huyser is seen in followup for atrial  fibrillation with uncontrolled ventricular response.  She is status post  AV junctional ablation.  She has normal left ventricular function.  Her  complaints are moderate shortness of breath with exertion.   MEDICATIONS:  Include Coumadin, Cozaar, and Furosemide.   PHYSICAL EXAMINATION:  VITAL SIGNS:  Her blood pressure is 130/80, her  pulse is 76.  LUNGS:  Clear.  HEART:  Heart sounds are regular.  EXTREMITIES:  Without peripheral edema.   Interrogation of her St. Jude pulse generator demonstrates no intrinsic  ventricular rhythm, the impendence was 460, and threshold was 0.375 at  0.4.  Battery voltage was 2.74.   IMPRESSION:  1. Atrial fibrillation status post atrioventricular junction ablation.  2. Status post pacer for the above.  3. Diastolic heart failure.   Ms. Hoggard is stable.  We will plan to see her again in 6 months.  Her  device was reprogrammed to see if we could improve exercise performance.     Duke Salvia, MD, [facility]  Electronically Signed    SCK/MedQ  DD: 04/14/2008  DT: 04/15/2008  Job #: [id number]   cc:   Ernestina Penna, M.D.

## 2010-12-10 NOTE — Assessment & Plan Note (Signed)
[facility]                            CARDIOLOGY OFFICE NOTE   NAME:[name], [name]                      MRN:          [id number]  DATE:08/04/2007                            DOB:          [birth year]    PRIMARY:  Dr. Vernon Prey.   REASON FOR PRESENTATION:  Evaluate the patient with shortness of breath  and atrial fibrillation.   HISTORY OF PRESENT ILLNESS:  The patient is now 77 years old.  She has  done quite well since I last saw her.  She says she is feeling a little  bit better since she started a walking regimen.  She walks through her  large house doing laps to get exercise.  She says she is breathing  better.  She is not having any PND or orthopnea.  She is not noticing  any palpitations, pre-syncope, or syncope.  She has had no chest pain.   PAST MEDICAL HISTORY:  Permanent atrial fibrillation with AV node  ablation, permanent pacemaker placement.  Dyslipidemia.  Hypertension.  Resection of an ovarian cyst.  Colon surgery x2 for destruction of  adhesions.   ALLERGIES:  ACCUPRIL caused cough.  SOTALOL caused QT prolongation.  The  patient has been intolerant of low-dose beta blockers and Cardizem.   MEDICATIONS:  1. Coumadin per [facility].  2. Cozaar 25 mg daily.  3. Furosemide 20 mg daily.  4. Vitamin E.  5. Vitamin C.  6. Calcium.   REVIEW OF SYSTEMS:  As stated in the HPI and otherwise negative for  other systems.   PHYSICAL EXAMINATION:  The patient is in no distress.  Blood pressure 130/72, heart rate is 70 and regular.  HEENT:  Eyelids unremarkable.  Pupils are equal, round, and reactive to  light.  Fundi are not visualized.  NECK:  No jugular venous distension at 45 degrees, carotid upstroke  brisk and symmetric, left greater than right carotid bruit.  No  thyromegaly.  LYMPHATICS:  No adenopathy.  LUNGS:  Clear to auscultation bilaterally.  BACK:  No costovertebral angle tenderness.  CHEST:   Unremarkable.  HEART:  PMI not displaced or sustained, S1 and S2 within normal limits,  no S3, no S4, 2/6 axillary holosystolic murmur radiating out the aortic  outflow tract.  No diastolic murmurs.  ABDOMEN:  Flat, positive bowel sounds, normal in frequency and pitch, no  bruits, rebound, guarding.  No midline pulsatile masses, organomegaly.  SKIN:  No rashes, no nodules.  EXTREMITIES:  With 2+ pulses, no edema.   EKG shows atrial fibrillation with 100% ventricular paced beats.   ASSESSMENT AND PLAN:  1. Atrial fibrillation, the patient is doing well with respect to      this.  She has had atrioventricular ablation and a pacemaker that      is functioning.  No further cardiovascular testing is suggested.      She will follow up in 6 months with Dr. Graciela Husbands for pacemaker      followup and does transtelephonic monitoring.  She will remain on      her Coumadin.  2.  Carotid bruit.  This is probable systolic transmitted murmur.  She      had a negative Doppler in 2007.  No further testing is warranted.  3. Followup.  I will see the patient again in 1 year or sooner if      needed.     Rollene Rotunda, MD, [facility]  Electronically Signed    JH/MedQ  DD: 08/04/2007  DT: 08/04/2007  Job #: [id number]   cc:   Ernestina Penna, M.D.

## 2010-12-12 ENCOUNTER — Ambulatory Visit: Payer: Medicare Other | Admitting: *Deleted

## 2010-12-13 NOTE — Procedures (Signed)
[place]. [facility]  Patient:    [name], [name]                      MRN: [id number] Proc. Date: 11/12/00 Adm. Date:  [id number] Attending:  Nathen May                           Procedure Report  PREOPERATIVE DIAGNOSIS:  Atrial fibrillation with a poorly-controlled ventricular response.  POSTOPERATIVE DIAGNOSIS:  Atrial fibrillation with a poorly-controlled ventricular response.  PROCEDURE:  Atrioventricular junction ablation following ventricular pacemaker implantation.  DESCRIPTION OF PROCEDURE:  Following placement of the permanent pacemaker, the patient was then prepared for cardiac catheterization via the femoral site. After standard prep and drape, cardiac catheterization was performed with local anesthesia and conscious sedation.  Noninvasive blood pressure monitoring and oxygen saturation monitoring were performed continuously throughout the procedure.  At the end of the procedure, the catheters were removed, hemostasis was obtained, and the patient was transferred to the intensive care unit in stable condition.  CATHETERS:  A 7 French 4 mm deflectable-tip catheter was inserted via the right femoral vein using a Riley Kill (SR0) sheath to mapping sites at the AV junction.  RESULTS:  Surface electrocardiogram.  Rhythm:  Atrial fibrillation with an RR interval of approximately 850 msec.  Final rhythm:  There was no intrinsic ventricular rhythm with persistence of atrial fibrillation.  The HV interval was 48 msec.  A total of 2 minutes, 59 seconds of RF was applied in nine applications to sites where His potentials were identified.  Initial applications were performed where the AV ratio was as high as 2:3.  No heart block could be accomplished at these AV intervals.  Thereafter, the catheter was moved distally and at the site of final ablation, the AV ratio was approximately 1:5.  At this site, a complete heart block ensued.   Thereafter, it was noted there was no intrinsic rhythm.  Fluoroscopy time:  Five minutes of fluoro was used at 15 frames per second.  IMPRESSION: 1. Successful atrioventricular junction ablation following prior permanent    pacemaker implantation. 2. NO INTRINSIC RHYTHM.  The patient will be observed for 48 hours in hospital, attached to transcutaneous pads for at least 24 hours in the event of ventricular lead dislodgement.  The patients pacemaker was programmed at 90 beats per minute. DD:  11/12/00 TD:  11/13/00 Job: [id number]

## 2010-12-13 NOTE — Op Note (Signed)
   NAME:  [name], [name]                         ACCOUNT NO.:  [id number]   MEDICAL RECORD NO.:  [id number]                   PATIENT TYPE:  AMB   LOCATION:  ENDO                                 FACILITY:  [facility]   PHYSICIAN:  James L. Malon Kindle., M.D.          DATE OF BIRTH:  [birth year]   DATE OF PROCEDURE:  05/24/2002  DATE OF DISCHARGE:                                 OPERATIVE REPORT   PROCEDURE:  Colonoscopy and polypectomy.   MEDICATIONS:  Fentanyl 40 mcg, Versed 4 mg IV.   INDICATIONS:  Strong family history of colonic neoplasia.   ENDOSCOPE:  Olympus pediatric colonoscope.   DESCRIPTION OF PROCEDURE:  The procedure had been explained to the patient  and consent obtained.  With the patient in the left lateral decubitus  position, the scope was inserted and advanced under direct visualization.  The patient had diffuse melanosis coli.  We were able to advance around to  the cecum.  At the tip of the cecum was a 4 mm polyp that was cauterized.  The scope was withdrawn back to the ascending colon.  Another 4 mm polyp was  seen and was cauterized, both with the hot biopsy forceps.  These were  placed in separate jars.  The transverse and descending colon were free of  polyps.  In the sigmoid at approximately 30 cm from the anal verge, a 0.75  cm polyp was encountered, was removed with the snare and sucked through the  scope.  No other polyps were seen.  Again diffuse melanosis coli throughout  the entire colon.  The scope was withdrawn.  The patient tolerated the  procedure well.   ASSESSMENT:  Multiple colon polyps removed.    PLAN:  Routine postpolypectomy instructions.  Will resume Coumadin in five  days.  Will repeat procedure in two years.                                               James L. Malon Kindle., M.D.    Waldron Session  D:  05/24/2002  T:  05/24/2002  Job:  [id number]   cc:   Sheppard Plumber. Earlene Plater, M.D.  [address]  [place]  [place] [address]  Fax: [phone]   Ernestina Penna, M.D.  [address] [place]  [place] [address]  Fax: [phone]

## 2010-12-13 NOTE — Assessment & Plan Note (Signed)
[facility]                              CARDIOLOGY OFFICE NOTE   MAJESTIC, MOLONY                      [name]:          [id number]  DATE:04/01/2006                            DOB:          10-Mar-1917    REFERRING PHYSICIAN:  Ernestina Penna, M.D.   REASON FOR PRESENTATION:  Patient with shortness of breath.  Evaluate  patient with atrial fibrillation.   HISTORY OF PRESENT ILLNESS:  Patient is a pleasant 77 year old with  permanent atrial fibrillation, AV node ablation and permanent pacemaker  placement.  She presents for routine followup.  She says she has done well  from a cardiovascular standpoint.  She denies any new symptoms though she  has some chronic dyspnea with exertion.  She denies any chest pressure, neck  discomfort or arm discomfort.  She has had no palpitations, presyncope or  syncope.  She denies any PND or orthopnea.  She had her pacemaker checked in  July by Dr. Graciela Husbands.   PAST MEDICAL HISTORY:  1. Permanent atrial fibrillation with AV node ablation, permanent      pacemaker placement.  2. Hyperlipidemia.  3. Hypertension.  4. Resection of an ovarian cyst.  5. Colon surgery x2 for obstruction for adhesions.   ALLERGIES:  1. ACCUPRIL CAUSED COUGH.  2. SOTALOL CAUSED Q-T PROLONGATION.  3. THE PATIENT HAS BEEN INTOLERANT OF LOW-DOSE BETA-BLOCKERS AND CARDIZEM.   MEDICATIONS:  1. Coumadin per __________ [facility].  2. Cozaar 25 mg a day.  3. Furosemide 20 mg a day.   REVIEW OF SYSTEMS:  As stated in the HPI, otherwise negative for other  systems.   PHYSICAL EXAMINATION:  GENERAL:  Patient is in no distress.  VITAL SIGNS:  Blood pressure 134/78, heart rate 70 and regular.  HEENT:  Eyes unremarkable.  Pupils equal, round, react to light and fundi  not visualized.Marland Kitchen  NECK:  No jugular venous distention.  Left greater than right carotid bruit.  No thyromegaly.  LYMPHATICS:  No lymphadenopathy.  LUNGS:  Clear to  auscultation bilaterally.  BACK:  No costovertebral angle tenderness.  CHEST:  Unremarkable.  HEART:  PMI not displaced or sustained.  S1 and S2 within normal limits.  No  S3, no S4.  A 2/6 axillary holosystolic murmur radiating out the aortic  outflow tract.  No diastolic murmurs.  ABDOMEN:  Flat, positive bowel sounds.  Normal frequency and pitch.  No  bruits, rebound, guarding or midline pulsatile mass.  No organomegaly.  SKIN:  No rash.  UPPER EXTREMITIES:  2+ pulses.  No edema.   EKG:  Atrial fibrillation with ventricular pacing 100% capture.   ASSESSMENT/PLAN:  1. Atrial fibrillation.  The patient is maintaining Coumadin.  She has AV      node ablation and 100% capture of the pacemaker; rate of 70.  It is      functioning normally.  She will continue with her current regimen.  2. Carotid bruit.  This may be a transient murmur as she had some aortic      calcification but I do not really appreciate the murmur  traveling up      that direction.  I am going to get carotid Dopplers to make sure she      does not have stenosis as currently now the diagnosis is carotid bruit.  3. Mitral regurgitation.  The patient has very mild mitral regurgitation      on echo.  We will follow this clinically.  4. Followup.  I will see her in January 2009.  This will stagger the      visits so that she will see Dr. Graciela Husbands in July of every year and I will      see her in January of every year.                               [name] Rotunda, MD, [facility]    JH/MedQ  DD:  04/01/2006  DT:  04/01/2006  Job #:  [id number]   cc:   Ernestina Penna, M.D.

## 2010-12-13 NOTE — Discharge Summary (Signed)
[place]. [facility]  Patient:    [name], [name]                      MRN: [id number] Adm. Date:  [id number] Disc. Date: [id number] Attending:  Nathen May Dictator:   Shayne Alken, C.R.N.P. CC:         Madolyn Frieze. Jens Som, M.D. [facility]  Nathen May, M.D., [facility]  Rollene Rotunda, M.D. [facility]  Delsa Grana, R.N.   Discharge Summary  DISCHARGE DIAGNOSES: 1. Atrial fibrillation. 2. Bradycardia.  HISTORY OF PRESENT ILLNESS:  This is an 77 year old who was seen in EP consultation for atrial fibrillation that had been refractory to antiarrhythmic drug therapy.  She continues to feel weak and lightheaded and had a Holter monitor placed demonstrating poorly controlled rapid rate, but also any absence of drug therapy.  Some degree of relative bradycardia with the heart rates in the 40s and 50s.  She was intolerant to sodolol with QT prolongation which probably precludes the use of Tikosyn.  She also failed to tolerate cordorone.  She does not have ischemic heart disease at least since 1998.  It was felt that class I CAD is not likely going to be effective in the wake of a Firm trial.  It was discussed with the patient the potential benefits and downsides of rate or rhythm control.  The patient was admitted for an AV nodal ablation and placement of a permanent pacemaker.  HOSPITAL COURSE:  The patient had an Integrity SR St. Jude pacemaker placed on April 18, without difficulty and an AV ablation performed successfully after pacemaker was placed.  The patient tolerated the procedure well and had no postoperative complaints.  She was eventually discharged to home on her previous medication.  DISCHARGE MEDICATIONS: 1. Cozaar 25 mg daily. 2. Coumadin 5 mg daily except Sundays and Saturdays 2.5 mg daily.  ACTIVITY:  She was not to do any heavy lifting or strenuous activity with her left arm for four to six weeks.  Low fat, low cholesterol, no added  salt diet. She was to keep her wound clean and dry, no shower for one week.  Not to raise her arm above her head for one week and gradually raise as depicted on discharge summary.  She was to follow up with the physician assistant at [facility] for wound check on May 1, at 9 a.m. and Dr. Graciela Husbands in two to three months.  The office will call her with an appointment.  She is also to have a PT INR checked at Dr. Buel Ream office on Tuesday, April 23.DD:  12/30/00 TD:  12/30/00 Job: [id number]

## 2010-12-13 NOTE — Discharge Summary (Signed)
NAMEJAHNIYA, [name]                         ACCOUNT NO.:  [id number]   MEDICAL RECORD NO.:  [id number]                   PATIENT TYPE:  INP   LOCATION:  5013                                 FACILITY:  [facility]   PHYSICIAN:  Claude Manges. Cleophas Dunker, M.D.            DATE OF BIRTH:  April 15, 1917   DATE OF ADMISSION:  08/16/2002  DATE OF DISCHARGE:  08/19/2002                                 DISCHARGE SUMMARY   ADMISSION DIAGNOSES:  1. End-stage osteoarthritis, right hip.  2. Hypertension.  3. Atrial fibrillation, on chronic Coumadin.  4. Cardiac pacer.  5. Asthma.  6. Chronic constipation.  7. History of diverticulosis.  8. History of rheumatoid arthritis.   DISCHARGE DIAGNOSES:  1. Right total hip arthroplasty.  2. Postoperative blood loss anemia.  3. Hypertension.  4. Chronic atrial fibrillation with Coumadin therapy.  5. Cardiac pacemaker.  6. History of asthma.  7. Chronic constipation.  8. History of diverticulosis.  9. History of rheumatoid arthritis in hands.   HISTORY OF PRESENT ILLNESS:  The patient is an 77 year old white female who  presents with a three- to four-year history of worsening right hip pain.  The patient describes the pain as an intermittent dull aching over the  greater trochanter region with radiation into the lateral thigh.  It worsens  with any walking.  It improves with rest.  The patient is currently using a  cane to assist ambulation over the last year.  The patient does describe a  catching sensation.   ALLERGIES:  No known drug allergies.   CURRENT MEDICATIONS:  1. Cozaar 25 mg p.o. q.a.m.  2. Coumadin 5 mg on Monday, Wednesdays, Fridays, Sundays; 2.5 mg on     Thursday, Saturdays and Tuesdays.  3. MiraLax 17 g daily.  4. Nature's Tea daily.   SURGICAL PROCEDURE:  On August 16, 2002, the patient was taken to the  operating room by Dr. Claude Manges. [place], assisted by Dr. Lenard Galloway.  [facility] and [facility]. Duffy, P.A.C.  Under general  anesthesia, the patient  underwent a right total hip replacement with a Depuy AML small-stature 15-mm  femoral component, a 32-mm femoral head with a +5 neck length, a 54-mm outer  diameter Pinnacle acetabular 100 series cup with a Marathon acetabular liner  of +4 10 degree liner.  The patient tolerated the procedure well, there were  no complications and the patient was transferred to the recovery room and  then to the orthopedic floor in good condition.   CONSULTATIONS:  The following routine consults were requested:  Physical  therapy, occupational therapy, and rehab.   HOSPITAL COURSE:  On August 16, 2002, the patient was admitted to [facility] to the care of Dr. Norlene Campbell.  The patient was taken to  the OR where a right total hip arthroplasty was performed without any  complications.  The patient was transferred to the recovery room  and then to  the orthopedic floor in good condition, where the patient was started on  Coumadin for routine DVT prophylaxis and due to chronic atrial fibrillation.   The patient then incurred a total of three days' postoperative care on the  orthopedic floor, in which the patient did develop some postoperative blood  loss anemia where her hemoglobin dropped to 8.3.  She was typed and crossed  and transfused two units of packed red blood cells with an improvement of  her hemoglobin to 10.9.  The patient had no side-effects with the  transfusion.   The patient worked well with physical therapy on a daily basis.  Her vital  signs remained stable with only some low-grade temperatures which were self-  resolving.  The patient's wound remained benign for any signs of infection.  Her legs remained neuromotor and vascularly intact.  It was felt that the  patient would benefit significantly from a stay on the rehab unit prior to  being discharged to home.  A rehab consult was requested and the patient was  accepted in transfer to the unit on  postop day #3 in good condition.   LABORATORY DATA:  CBC on January 23rd:  WBC 10.0, hemoglobin 10.9,  hematocrit 32.3, platelets 193,000.  Coagulation studies on January 23rd:  PT was 20.3 with an INR of 1.9.  Routine chemistries on January 21st:  Sodium of 134, potassium of 4.5, glucose 211, BUN 10, creatinine 1.0.  Routine urinalysis on admission was normal.  The patient received a total of  two units of packed red blood cells during her hospitalization.   MEDICATIONS UPON DISCHARGE FROM ORTHOPEDIC FLOOR:  1. Cozaar 25 mg p.o. daily.  2. MiraLax 17 g p.o. daily.  3. Colace 100 mg p.o. b.i.d.  4. Senokot one tablet p.o. b.i.d.  5. Laxative or enema or choice p.r.n.  6. Vicodin one or two tablets every four to six hours p.r.n.  7. Tylenol 650 mg p.o. q.4h. p.r.n.  8. Robaxin 500 mg p.o. q.6h. p.r.n.  9. Restoril 15 mg p.o. q.h.s. p.r.n.  10.      Coumadin 5 mg p.o. daily.   DISCHARGE INSTRUCTIONS ONTO REHABILITATION FLOOR:  1. Medications:  The patient is to resume medications as dispensed on     orthopedic floor.  2. Activity:  The patient may be weightbearing 50% on her right lower     extremity with the use of a walker and close supervision.  3. Diet:  No restrictions.  4. Wound care:  The patient should keep wound clean and dry, have it checked     daily for any signs of infection.  Staples may be removed on postop day     #14.    FOLLOWUP:  The patient should have a followup appointment with Dr. Cleophas Dunker  in his office two weeks from discharge from rehab floor.   CONDITION ON DISCHARGE:  The patient's condition upon discharge to rehab  unit is good.     Jamelle Rushing, P.A.                      Claude Manges. Cleophas Dunker, M.D.    RWK/MEDQ  D:  09/21/2002  T:  09/21/2002  Job:  [id number]

## 2010-12-13 NOTE — Op Note (Signed)
[name], [name]               ACCOUNT NO.:  [id number]   MEDICAL RECORD NO.:  [id number]          PATIENT TYPE:  AMB   LOCATION:  ENDO                         FACILITY:  [facility]   PHYSICIAN:  James L. Malon Kindle., M.D.DATE OF BIRTH:  02-02-1917   DATE OF PROCEDURE:  DATE OF DISCHARGE:                                 OPERATIVE REPORT   Audio too short to transcribe (less than 5 seconds)      JLE/MEDQ  D:  07/08/2004  T:  07/08/2004  Job:  [id number]

## 2010-12-13 NOTE — Discharge Summary (Signed)
[name], [name]                         ACCOUNT NO.:  [id number]   MEDICAL RECORD NO.:  [id number]                   PATIENT TYPE:  IPS   LOCATION:  4144                                 FACILITY:  [facility]   PHYSICIAN:  Ranelle Oyster, M.D.             DATE OF BIRTH:  March 11, 1917   DATE OF ADMISSION:  08/19/2002  DATE OF DISCHARGE:  08/26/2002                                 DISCHARGE SUMMARY   DISCHARGE DIAGNOSES:  1. Right total hip replacement, 08/16/02, secondary to osteoarthritis,     anemia.  2. Paroxysmal atrial fibrillation with pacemaker and chronic Coumadin.  3. Hypertension.  4. Chronic constipation.   HISTORY OF PRESENT ILLNESS:  An 77 year old female with history of chronic  atrial fibrillation and pacemaker on chronic Coumadin, who was admitted 1/20  with end-stage osteoarthritis of the right hip and no relief with  conservative care.  Underwent a right total hip replacement 1/20 per Dr.  Cleophas Dunker, advised 50% weight bearing.  Chronic Coumadin was resumed.  Foley  catheter tube removed 08/17/02.  Anemia 8.3 and transfused two units of  packed red blood cells on 1/22.  Bouts of constipation resolved with  laxative assistance.  Total suspend mobility, moderate assist for  angulation.  Latest INR of 1.9. Hemoglobin 10.9.  Admitted for comprehensive  Rehab program.   PAST MEDICAL HISTORY:  See discharge diagnoses.   PAST SURGICAL HISTORY:  1. Colon surgery times 2.  2. Bilateral cataract surgery.   ALLERGIES:  AMIODARONE.   PRIMARY CARE PHYSICIAN:  Rudi Heap, MD, of [place], [place].   SOCIAL HISTORY:  Denies alcohol or tobacco.  Lives alone in [place].  Independent prior to admission.  Used a cane.  One level home.  Two steps to  entry.  Daughter in area, works five hours a day but plans to take time off  to provide assistance.   MEDICATIONS PRIOR TO ADMISSION:  1. Cozaar 25 mg daily.  2. Coumadin 5 mg Monday, Wednesday, Friday, Sunday; 2.5 mg  Tuesday,     Thursday, Saturday.  3. MiraLax daily.   HOSPITAL COURSE:  Patient did well on Rehabilitation Services with therapies  initiated on a twice a day basis.  The following issues were followed during  patient's rehab course:  1. Pertaining to Ms. Hennessee's right total hip replacement of 08/16/02:     Surgical site healing nicely.  No signs of infection.  She was ambulating     with a walker.  Partial weight bearing with hip precautions.  She will     follow up with Dr. Cleophas Dunker.  2. She remained on chronic Coumadin for her history of atrial fibrillation     with cardiac rate controlled.  Latest INR of 2.9.  She would follow up     with Cardiology Services.  A Home Health Nurse was to be provided for     necessary blood draws.  3. Postoperative anemia remains stable.  Latest hemoglobin of 12.     Hematocrit 35.5.  4. Blood pressure is controlled on Cozaar.  No orthostatic changes noted.  5. She denied any bladder disturbances.  6. She had a history of constipation which was regulated with laxative     assistance.  7. Overall, for her functional mobility, she was independent with activities     of daily living.  Supervision to modified independence for her     ambulation.  Home Health Physical/Occupational therapy and a nurse have     been arranged.   LATEST LABS:  As noted above.  INR 2.9.  Hemoglobin 12.  Hematocrit 35.5.  Sodium 136.  Potassium 4.0.  BUN at 13. Creatinine 0.8.   DISCHARGE MEDICATIONS:  1. Coumadin with latest dose of 3 mg daily  2. Cozaar 5 mg daily.  3. Colace 100 mg twice daily.  4. MiraLax 17 gm at bedtime.  5. Vicodin as needed for pain.   ACTIVITY:  Partial weight bearing with hip precautions.   DIET:  Regular.   WOUND CARE:  Cleanse incision daily with soap and water.   SPECIAL INSTRUCTIONS:  Home Health Nurse for check of INR on Monday, 08/29/02.  Results to [employer].  She should follow up with Dr. Cleophas Dunker of  Orthopedic Services.   Call for appointment.      Mariam Dollar, P.A.                     Ranelle Oyster, M.D.    DA/MEDQ  D:  08/25/2002  T:  08/25/2002  Job:  [id number]   cc:   Ranelle Oyster, M.D.  [address] [place]  [place] [address]  Fax: [phone]   Ernestina Penna, M.D.  [address] [place]  [place] [address]  Fax: [phone]   Rollene Rotunda, M.D. [facility]  [address]  [place]  [place] [address]  Fax: [phone]

## 2010-12-13 NOTE — Discharge Summary (Signed)
[place]. [facility]  Patient:    [name], [name]                      MRN: [id number] Adm. Date:  [id number] Disc. Date: [id number] Attending:  McDiarmid, Leighton Roach. Dictator:   Lyndee Leo. [facility]                           Discharge Summary  HISTORY OF PRESENT ILLNESS:  Patient was admitted on September 10, 1999 for chest pain and rule out MI.  Patient was also seen by Dr. Antoine Poche for atrial fibrillation and has been on amiodarone for that over the past few weeks and actually was scheduled to see Dr. Antoine Poche for planned cardioversion two days after her admission date.  Patients amiodarone was held on admission, _________ long half life and TSH was checked.  Her INR also was elevated.  Her Coumadin was held. Cardiology saw her and went ahead and planned for cardioversion for her, held her Digoxin, amiodarone.  Her chest pain and pressure was much improved after her amiodarone was held.  Her cardiac enzymes were negative.  TSH was 5.221, B-MET as within normal limits.  Patient was successfully cardioverted back to normal sinus rhythm, was started on Nystatin for cholesterol.  Cholesterol was 273, triglycerides 144, HDL 47, LDL 197, SGOT 83, SGPT 18.  Patient was started on Nystatin.  Will monitor her LFTs on an outpatient basis.  Also physical therapy was consulted to work with patient for strengthening while in house.  Patient was placed back on her Coumadin and will continue that for a few weeks after her cardioversion for prophylaxis.  Patient will follow up with her primary M.D. for continued monitoring of her history of atrial fibrillation and to make sure she  does not reconvert.  DISCHARGE DIAGNOSIS:  Atrial fibrillation with successful cardioversion.  DISCHARGE MEDICATIONS: 1. Coumadin 3 mg Tuesday and Saturday and 2.5 mg Monday, Wednesday, Thursday,    Friday, Sunday. 2. Patient was given a prescription for nitroglycerin sublingual. 3. Cozaar 50  mg half a tablet p.o. q.d. 4. Patient will also be started on Nystatin on outpatient basis.  DISCHARGE PLAN:  FOLLOW-UP:  Patient will follow up with primary M.D., Dr. Christell Constant, at [facility], as well as Dr. Antoine Poche from cardiology. DD:  10/16/99 TD:  10/16/99 Job: [id number]

## 2010-12-13 NOTE — H&P (Signed)
NAME:  [name], [name]                         ACCOUNT NO.:  [id number]   MEDICAL RECORD NO.:  [id number]                   PATIENT TYPE:  INP   LOCATION:  NA                                   FACILITY:  [facility]   PHYSICIAN:  Claude Manges. Cleophas Dunker, M.D.            DATE OF BIRTH:  11/03/1916   DATE OF ADMISSION:  DATE OF DISCHARGE:                                HISTORY & PHYSICAL   CHIEF COMPLAINT:  Progressively worsening right worsening right hip pain for  the last three to four years.   HISTORY OF PRESENT ILLNESS:  This 77 year old white female patient presented  to Dr. Cleophas Dunker with a three to four history of gradual onset but  progressively worsening right hip pain. She has had no known injury or prior  surgery to her hip. The pain at this time is described as an intermittent,  dull aching sensation located over the greater trochanter and buttock area  with some radiation distally into her lateral thigh. The patient does  increase with walking on it or occasionally if she lies on that right side.  Pain does decrease if she sits and rests for a while. She has complained of  some problems with the hip catching at times so that she even has to walk  around a little bit before she can actually sit down. There is no  paresthesias, back pain, locking, grinding, giving away, or night pain  associated with the hip. She is not currently taking any medications for  pain. She has been using the cane for the last year because of the hip pain.   ALLERGIES:  No known drug allergies. She does report she is allergic to  feathers and chocolate.   CURRENT MEDICATIONS:  1. Cozaar 25 mg one tablet p.o. daily.  2. Coumadin 5 mg p.o. every Monday, Wednesday, Friday, and Sunday and 2.5 mg     p.o. every Tuesday, Thursday, and Saturday. Her last dose will be     08/12/02.  3. MiraLax 17 g in 8 ounces of water p.o. daily.  4. Nature's Tea taken p.o. daily for constipation.   PAST MEDICAL HISTORY:   She was diagnosed with hypertension about four years  ago. She does have a history of atrial fibrillation for five years and a  pacemaker placed in 4/02. She is on chronic Coumadin therapy for her atrial  fibrillation. She said she had asthma probably about 30 years ago but has  been in remission since that time. She does have problems with asthma with  any respiratory infections. She has chronic constipation due to several  bowel surgeries and possibly a back injury in the past. She does have a  history of diverticulosis and some colon resection because of that. She was  diagnosed with rheumatoid arthritis in her hands at age 50 but received gold  shots and says she has been remission since that time. Her medical  doctor is  Dr. Rudi Heap, and her cardiologist is Dr. Antoine Poche.   She denies any history of diabetes mellitus, thyroid disease, hiatal hernia,  reflux, peptic ulcer disease, or any other chronic medical condition other  than noted previously.   PAST SURGICAL HISTORY:  1. Removal ovarian cyst and appendectomy by Dr. Bobby Rumpf in [place] in     1957.  2. Excision of colon polyp, noncancerous, by Dr. Kendrick Ranch in 1992.  3. Colon surgery for removal of adhesions by Dr. Myrle Sheng in 1996.  4. Cardiac pacemaker by Dr. Graciela Husbands in 2002.  5. Excision bilateral cataracts about five years ago.   Denies any complications from the above mentioned procedures.   SOCIAL HISTORY:  She denies any history of cigarette smoking, alcohol use,  or drug use. She is divorced and has one daughter. She lives by herself in a  one story house with two steps into the main entrance. She retired at age 73  from the [employer].   Her medical doctor is Dr. Rudi Heap in [place], and his phone number is  [phone].   FAMILY HISTORY:  Her mother died at the age of 85 with a fractured pelvis, a  DVT, a stroke, diabetes, and osteoporosis. Her father died at the age of 49  with heart failure and  diabetes. She has two brothers alive, one age 52 and  one age 55, with the older one with a pacemaker. She has one sister alive at  age 93 with colon cancer. Her younger brother did have history of a heart  attack. She had two brothers who passed away, both of lung cancer, one at  age 27 and one at age 22. Her daughter is age 76 with reflux and sciatica  but otherwise alive and healthy.   REVIEW OF SYMPTOMS:  She does have a remote history of nasal polyps being  removed about 50 years ago. She has some nocturia and urinary leakage every  day, and she says she goes about twice a night. She was diagnosed with  rheumatoid arthritis about 30 years ago. She has had very little progression  of the disease since her treatment with gold shots. She does wear glasses.  She does not have a living will, and her power of attorney is her daughter,  Ardeen Fillers. All other systems are negative and noncontributory.   PHYSICAL EXAMINATION:  GENERAL:  Well-developed, well-nourished, thin, white  female in no acute distress. Walks with a slight limp and the use of a cane.  Mood and affect are appropriate. Talks easily with the examiner. Accompanied  by her daughter. Height 5 feet 6 inches, weight 155 pounds. BMI is 24.  VITAL SIGNS:  Temperature 97.3 degrees Fahrenheit, pulse 80, respirations  20, and blood pressure 142/84.  HEENT:  Normocephalic, atraumatic without frontal or maxillary sinus  tenderness to palpation. Conjunctivae is pink. Sclerae anicteric. PERRLA.  EOMs intact. No visible external ear deformities. Hearing grossly intact.  Tympanic membranes pearly gray bilaterally with good light reflex. Nose and  nasal septal midline. Nasal mucosa pink and moist without exudates or polyps  noted. Buccal mucosa pink and moist. Good dentition. Oropharynx without  erythema or exudates. Tongue and uvula midline. Tongue without fasciculations and uvula rises equally with phonation.  NECK:  No visible masses or  lesions noted. Trachea midline. No palpable  lymphadenopathy. No thyromegaly. Carotids +2 bilaterally without bruits.  Full range of motion and nontender to palpation along the cervical spine.  CARDIOVASCULAR:  Heart regular, rate, and rhythm. S1 and S2 present without  rubs, clicks, or murmurs noted.  RESPIRATORY:  Respirations even and unlabored. Breath sounds clear to  auscultation bilaterally without rales or wheezes noted.  ABDOMEN:  Rounded abdominal contour. Bowel sounds present x4 quadrants. Soft  and nontender to palpation without hepatosplenomegaly or CVA tenderness.  Femoral pulses +2 bilaterally. Nontender to palpation along the entire leg  and the vertebral column.  BREASTS/GENITOURINARY/RECTAL/PELVIC:  These exams deferred at this time.  MUSCULOSKELETAL:  No obvious deformities bilateral upper extremities. There  is full range of motion of these extremities without pain. Radial pulses +2  bilaterally. She has full range of motion of her hips, ankles, and toes  bilaterally. DP and PT pulses are +2. Negative Homans and no calf pain with  palpation bilaterally. No lower extremity edema. She is able to flex her  left hip to about 110 degrees with full extension and easy internal and  external rotation that is full. No pain with range of motion of the hip. No  pain with palpation about the left groin or greater trochanter. No real  tenderness to palpation over the right groin or greater trochanter. She can  flex the right hip to about 100 degrees but does cause some pain. She can  fully extend but has to kind of wait for a minute before it will relax out  into full extension. She has almost no internal or external rotation of the  right hip, and this causes the most pain when this attempted. Restricted  abduction because of the pain at this time on the right.  NEUROLOGICAL:  Alert and oriented x3. Cranial nerves II-XII are grossly  intact. Strength 5/5 bilateral upper and lower  extremities. Rapid  alternating movements intact. Deep tendon reflexes 2+ bilateral upper and  lower extremities. Sensation intact to light touch.   RADIOLOGIC FINDINGS:  X-rays taken of her pelvis in 9/03 showed considerable  osteoarthritis in the right hip with a fair amount of osteopenia. There is  no joint space left and a fair amount of sclerosis of the femoral head.   IMPRESSION:  1. End-stage osteoarthritis, right hip.  2. Hypertension.  3. Chronic atrial fibrillation on chronic Coumadin.  4. Cardiac pacemaker.  5. History of asthma.  6. Chronic constipation.  7. History of diverticulosis.  8. History of rheumatoid arthritis in her hands.   PLAN:  Ms. Madrazo will be admitted to [facility] hospital on 08/16/02 where  she undergo a right total hip arthroplasty by Dr. Claude Manges. [place]. She  undergo all the routine preoperative laboratory tests and studies prior to this procedure. If she has any medical issues, we will consult one of the  hospitalist or Dr. Antoine Poche to follow her while she is at the hospital.     Legrand Pitts. Duffy, P.A.                      Claude Manges. Cleophas Dunker, M.D.    KED/MEDQ  D:  08/02/2002  T:  08/02/2002  Job:  [id number]

## 2010-12-13 NOTE — Procedures (Signed)
[place]. [facility]  Patient:    [name], [name]                      MRN: [id number] Proc. Date: 11/12/00 Adm. Date:  [id number] Attending:  Nathen May CC:         Rollene Rotunda, M.D. [facility]  Electrophysiology Laboratory  [place], Attention [facility], [place]   Procedure Report  PREOPERATIVE DIAGNOSIS:  Atrial fibrillation with an uncontrolled ventricular response.  POSTOPERATIVE DIAGNOSIS:  Atrial fibrillation with an uncontrolled ventricular response.  PROCEDURES:  Pacemaker implantation, contrast venography with anticipated atrioventricular junction ablation.  DESCRIPTION OF PROCEDURE:  Following the obtaining of informed consent, the patient was brought to the electrophysiology laboratory and placed on the fluoroscopic table in the supine position.  After routine prep and drape of the left upper chest, intravenous contrast was injected via the left antecubital vein to identify the course and the patency of the extrathoracic left subclavian vein.  This having been accomplished, lidocaine was infiltrated into the prepectoral subclavicular region.  An incision was made and carried down to the layer of the prepectoral fascia using electrocautery. A pocket was formed using electrocautery.  Hemostasis was obtained.  Thereafter, attention was turned to gaining access to the extrathoracic left subclavian vein, which was accomplished without difficulty without the aspiration of air or puncture of the artery.  A guidewire was placed and retained, and a 0 silk suture was placed in a figure-of-eight fashion and allowed to hang loosely.  Thereafter, a 7 French tear-away introducer sheath was placed, through which was passed a Medtronic 5076, 58 cm length lead, serial number [id number]. Under fluoroscopic guidance it was manipulated to the floor of the right ventricle just proximal to the apex, where the bipolar R-wave was  19.5 millivolts, with a pacing impedance of 0.9 volts at 0.5 milliseconds.  The current of threshold was 1.2 MA.  The pacing impedance was 795 Ohms, and there was no diaphragmatic pacing at 10 volts.  With these acceptable parameters recorded, the lad was secured to the prepectoral fascia and attached to a Pacesetter Integrity SR pulse generator, model 5142, serial number [id number].  Intermittent ventricular pacing was identified.  The pocket was copiously irrigated with antibiotic-containing saline solution.  The leads of the pulse generator were then secured to the pocket, secured to the prepectoral fascia, and the wound was closed in three layers in the normal fashion.  The wound was washed, dried, and a benzoin and Steri-Strip dressing was then applied.  Needle counts, sponge counts, and instrument counts were correct at the end of the procedure according to the staff. DD:  11/12/00 TD:  11/13/00 Job: [id number]

## 2010-12-13 NOTE — Op Note (Signed)
[name], [name]               ACCOUNT NO.:  [id number]   MEDICAL RECORD NO.:  [id number]          PATIENT TYPE:  AMB   LOCATION:  ENDO                         FACILITY:  [facility]   PHYSICIAN:  James L. Malon Kindle., M.D.DATE OF BIRTH:  [birth year]   DATE OF PROCEDURE:  07/08/2004  DATE OF DISCHARGE:                                 OPERATIVE REPORT   PROCEDURE:  Colonoscopy.   MEDICATIONS:  Fentanyl 37.5 mcg, Versed 4 mg IV.   INDICATIONS:  Patient with a previous history of adenomatous colon polyps  and a family history of colonic neoplasia.   DESCRIPTION OF PROCEDURE:  The procedure had been explained to the patient,  and consent obtained.  In the left lateral decubitus position, the Olympus  scope was inserted and advanced.  The pediatric adjustable scope was used.  The patient had a somewhat tortuous colon.  We reached the cecum, ileocecal  valve, and appendiceal orifice seen.  The scope was withdrawn to the cecum.  The ascending colon, transverse colon, splenic flexure, descending and  sigmoid colon seen well.  The patient had diffuse melanosis coli but no  colon polyps were seen throughout the entire colon.  There is no significant  diverticular disease.  The scope was withdrawn.  The patient tolerated the  procedure well and was monitored throughout.   ASSESSMENT:  History of colon polyps with negative colonoscopy at this time.  V12.72.   PLAN:  Will recommend yearly Hemoccults and will not recommend a five-year  repeat, since at that time, she will be 77 years old but will repeat only  for specific indications, such as heme-positive stool, anemia, etc.      JLE/MEDQ  D:  07/08/2004  T:  07/08/2004  Job:  [id number]   cc:   Ernestina Penna, M.D.  [address] [place]  [place] [address]  Fax: [phone]

## 2010-12-13 NOTE — Op Note (Signed)
NAME:  [name], [name]                         ACCOUNT NO.:  [id number]   MEDICAL RECORD NO.:  [id number]                   PATIENT TYPE:  INP   LOCATION:  2550                                 FACILITY:  [facility]   PHYSICIAN:  Claude Manges. Cleophas Dunker, M.D.            DATE OF BIRTH:  11/24/1916   DATE OF PROCEDURE:  08/16/2002  DATE OF DISCHARGE:                                 OPERATIVE REPORT   PREOPERATIVE DIAGNOSIS:  End-stage osteoarthritis, right hip.   POSTOPERATIVE DIAGNOSIS:  End-stage osteoarthritis, right hip.   PROCEDURE:  Right total hip replacement.   SURGEON:  Claude Manges. Cleophas Dunker, M.D.   ASSISTANTS:  Lenard Galloway. Chaney Malling, M.D., and Legrand Pitts. Duffy, P.A.   ANESTHESIA:  General orotracheal.   COMPLICATIONS:  None.   COMPONENTS:  DePuy AML small-stature 15 mm femoral component, i.e., MMA.  A  32 mm femoral head with a +5 neck.  A 54 mm outer diameter Pinnacle  acetabular 100 Series cup with the Marathon acetabular liner, +4 10 degree  liner.   DESCRIPTION OF PROCEDURE:  With the patient comfortable on the operating  table and under general orotracheal anesthesia, the patient was placed in  the lateral decubitus position after inserting the Foley catheter.  The  patient was secured to the operating table with the Innomed hip system.  The  right hip was then prepped with Betadine scrub and then Duraprep from the  iliac crest to below the knee.  Sterile draping was performed.   A routine southern incision was utilized and by sharp dissection carried  down to subcutaneous tissue.  Small bleeders were Bovie coagulated.  Adipose  was incised with the Bovie, self-retaining retractors were inserted.  The  iliotibial band was then incised along the length of the skin incision.   With the  hip internally rotated, the short external rotators were  identified, tendinous structures were tagged with 0 Tycron suture.  These  were then incised with the Bovie from the posterior  attachment to the  greater trochanter.  The capsule was identified and incised along the  femoral neck and head.  There was a clear yellow joint effusion and abundant  beefy-red synovial tissue.  A synovectomy was performed.   The head was then dislocated posteriorly.  Using the AML guide, the calcar  was incised.  The femoral head and neck were then removed from the hip.  The  femoral head was devoid of articular cartilage and misshapen.   Retractors were then inserted.  A starter hole was then made in the  piriformis fossa.  Reaming was performed to 14.5 mm to accept a 15 mm  prosthesis, which had been templated preoperatively.  Sequential rasping was  then performed to 15 mm medial modified aspect.  A calcar reamer was  inserted to obtain the appropriate angle in the calcar.   Retractor was then placed about the acetabulum and further synovectomy was  performed.  The labrum was incised.  Reaming was performed to 53 mm to  accept a 54 mm prosthesis.  We had an excellent ream with nice bleeding.  We  trialled a 52 and a 54 outer diameter acetabular component and felt that we  had excellent rim fit.  With the 54 it would not completely seat.   The Pinnacle 100 Series acetabular component was then impacted flush in a  neutral position.  It was seated completely and was perfectly tight.   The apex hole eliminator was then inserted, followed by the trial  polyethylene component +4 with a 10 degree posterior lip.  The 15 medial  modified aspect rasp was then inserted, and we trialled a +5 femoral neck  and the 32 mm hip ball.  The entire construct was reduced with excellent  range of motion, and there was absolutely no instability and we felt leg  lengths were perfectly equal, as they were preoperatively.   The trial components were removed.  The Marathon +4 10 degree lip liner was  then impacted.  The wound was irrigated throughout the procedure with  antibiotic and saline solution.   The femoral component was then impacted  flush on the calcar in about 15 degrees of anteversion, and the final  femoral head was then impacted.  The entire construct was reduced, again  with excellent range of motion, no instability, and maintenance of normal  leg lengths.   The wound was again irrigated with saline solution and antibiotic solution.  The capsule was closed anatomically with a 0 Tycron.  The short external  rotators were reapproximated anatomically with the same material.  The  iliotibial band was closed with a running 0 Vicryl, the subcu closed in  several layers with 0 and 2-0 Vicryl and skin clips.  A sterile bulky  dressing was applied.  The patient was placed in the supine position and a  knee immobilizer applied.  She was then awoken and returned to the  postanesthesia in satisfactory condition.                                               Claude Manges. Cleophas Dunker, M.D.    PWW/MEDQ  D:  08/16/2002  T:  08/16/2002  Job:  [id number]

## 2010-12-16 ENCOUNTER — Ambulatory Visit: Payer: Medicare Other | Admitting: Physical Therapy

## 2010-12-19 ENCOUNTER — Ambulatory Visit: Payer: Medicare Other | Admitting: *Deleted

## 2010-12-26 ENCOUNTER — Ambulatory Visit: Payer: Medicare Other | Admitting: *Deleted

## 2010-12-30 ENCOUNTER — Ambulatory Visit: Payer: Medicare Other | Attending: Orthopaedic Surgery | Admitting: Physical Therapy

## 2010-12-30 DIAGNOSIS — M545 Low back pain, unspecified: Secondary | ICD-10-CM | POA: Insufficient documentation

## 2010-12-30 DIAGNOSIS — M2569 Stiffness of other specified joint, not elsewhere classified: Secondary | ICD-10-CM | POA: Insufficient documentation

## 2010-12-30 DIAGNOSIS — IMO0001 Reserved for inherently not codable concepts without codable children: Secondary | ICD-10-CM | POA: Insufficient documentation

## 2011-01-02 ENCOUNTER — Ambulatory Visit: Payer: Medicare Other | Admitting: *Deleted

## 2011-01-06 ENCOUNTER — Ambulatory Visit: Payer: Medicare Other

## 2011-01-08 ENCOUNTER — Encounter: Payer: Self-pay | Admitting: Internal Medicine

## 2011-01-08 DIAGNOSIS — I495 Sick sinus syndrome: Secondary | ICD-10-CM

## 2011-01-09 ENCOUNTER — Ambulatory Visit: Payer: Medicare Other | Admitting: Physical Therapy

## 2011-01-13 ENCOUNTER — Ambulatory Visit: Payer: Medicare Other | Admitting: Physical Therapy

## 2011-01-16 ENCOUNTER — Ambulatory Visit: Payer: Medicare Other | Admitting: *Deleted

## 2011-01-20 ENCOUNTER — Ambulatory Visit: Payer: Medicare Other | Admitting: Physical Therapy

## 2011-03-04 ENCOUNTER — Encounter: Payer: Self-pay | Admitting: Cardiology

## 2011-03-25 ENCOUNTER — Encounter: Payer: Self-pay | Admitting: *Deleted

## 2011-04-07 ENCOUNTER — Encounter: Payer: Self-pay | Admitting: Internal Medicine

## 2011-04-09 ENCOUNTER — Encounter: Payer: Self-pay | Admitting: Internal Medicine

## 2011-04-09 DIAGNOSIS — I495 Sick sinus syndrome: Secondary | ICD-10-CM

## 2011-05-05 ENCOUNTER — Telehealth: Payer: Self-pay | Admitting: Internal Medicine

## 2011-05-05 NOTE — Telephone Encounter (Signed)
Pt daughter calling regarding pt device checks. Pt received letter in mail stating that device had not been checked in the recommended period of time. Pt said she has been checking device through company every 3 months and wanted to make sure we were receiving transmissions. Please return pt daughter call to discuss further.

## 2011-05-05 NOTE — Telephone Encounter (Signed)
Spoke with daughter and waiting to schedule pt in January once schedule comes out.

## 2011-07-07 ENCOUNTER — Other Ambulatory Visit: Payer: Self-pay | Admitting: Family Medicine

## 2011-07-07 DIAGNOSIS — R194 Change in bowel habit: Secondary | ICD-10-CM

## 2011-07-11 ENCOUNTER — Ambulatory Visit (INDEPENDENT_AMBULATORY_CARE_PROVIDER_SITE_OTHER): Payer: Medicare Other | Admitting: Internal Medicine

## 2011-07-11 ENCOUNTER — Ambulatory Visit
Admission: RE | Admit: 2011-07-11 | Discharge: 2011-07-11 | Disposition: A | Discharge status: Home / Self Care | Payer: Medicare Other | Source: Ambulatory Visit | Attending: Family Medicine | Admitting: Family Medicine

## 2011-07-11 ENCOUNTER — Encounter: Payer: Self-pay | Admitting: Internal Medicine

## 2011-07-11 DIAGNOSIS — I442 Atrioventricular block, complete: Secondary | ICD-10-CM

## 2011-07-11 DIAGNOSIS — Z950 Presence of cardiac pacemaker: Secondary | ICD-10-CM

## 2011-07-11 DIAGNOSIS — R194 Change in bowel habit: Secondary | ICD-10-CM

## 2011-07-11 DIAGNOSIS — I4891 Unspecified atrial fibrillation: Secondary | ICD-10-CM

## 2011-07-11 LAB — PACEMAKER DEVICE OBSERVATION
BATTERY VOLTAGE: 2.69 V
BMOD-0002RV: 9
BRDY-0004RV: 120 {beats}/min

## 2011-07-11 MED ORDER — IOHEXOL 300 MG/ML  SOLN
100.0000 mL | Freq: Once | INTRAMUSCULAR | Status: AC | PRN
  Administered 2011-07-11: 100 mL via INTRAVENOUS

## 2011-07-11 NOTE — Patient Instructions (Signed)
Your physician recommends that you continue on your current medications as directed. Please refer to the Current Medication list given to you today.  Your physician wants you to follow-up in: 1 year. You will receive a reminder letter in the mail two months in advance. If you don't receive a letter, please call our office to schedule the follow-up appointment.  

## 2011-07-11 NOTE — Progress Notes (Signed)
  HPI  [name] is a 77 y.o. female Seen in followup for atrial fibrillation with an uncontrolled ventricular response for which she underwent AV junction ablation and pacemaker implantation.   The patient denies chest pain, shortness of breath, nocturnal dyspnea, orthopnea or peripheral edema.  There have been no palpitations, lightheadedness or syncope.   she is struggling with constipation    Past Medical History  Diagnosis Date  . Atrial fibrillation   . Hypertension   . Heart failure, diastolic   . Carotid bruit   . S/P AV nodal ablation   . Hyperlipidemia   . Ovarian cyst   . Colon disorder     Adhesions  . QT prolongation     With solatol  . Nephrolithiasis   . Mitral regurgitation     Moderate    Past Surgical History  Procedure Date  . Pacemaker insertion     St. Jude  . Cystectomy     Ovarian  . Abdominal adhesion surgery   . Cataract extraction     Bilaterally  . Colon surgery     Colonic obstruction secondary to diverticulitis with resection    Current Outpatient Prescriptions  Medication Sig Dispense Refill  . b complex vitamins tablet Take 1 tablet by mouth daily.        . Cholecalciferol (VITAMIN D3) 1000 UNITS tablet Take 2,000 Units by mouth daily.        . furosemide (LASIX) 20 MG tablet Take 20 mg by mouth daily.        Marland Kitchen losartan (COZAAR) 50 MG tablet Take 25 mg by mouth daily.       . vitamin C (ASCORBIC ACID) 500 MG tablet Take 500 mg by mouth daily.        Marland Kitchen warfarin (COUMADIN) 5 MG tablet Take 5 mg by mouth daily.          Allergies  Allergen Reactions  . Actonel     unknown  . Atorvastatin     unknown  . Boniva (Ibandronate Sodium)     unknown  . Chocolate     unknown  . Lovastatin     unknown  . Pravastatin Sodium     unknown  . Toprol Xl (Metoprolol Succinate)     unknown  . Visken (Pindolol)     unknown  . Welchol (Colesevelam Hcl)     unknown    Review of Systems negative except from HPI and PMH  Physical  Exam Well developed and well nourished in no acute distress HENT normal E scleral and icterus clear Neck Supple JVP flat; carotids brisk and full Clear to ausculation Regular rate and rhythm, 3/6  murmurs radiating to apex  Soft with active bowel sounds No clubbing cyanosis none Edema Alert and oriented, grossly normal motor and sensory function Skin Warm and Dry  ECG Atrial fibrillation with complete heart block and ventricular pacing  Assessment and  Plan

## 2011-07-11 NOTE — Assessment & Plan Note (Signed)
The patient's device was interrogated.  The information was reviewed. No changes were made in the programming.    

## 2011-07-11 NOTE — Assessment & Plan Note (Signed)
Rate controlled of the AV junction ablation. On warfarin

## 2011-07-11 NOTE — Assessment & Plan Note (Signed)
Stable post device implantation 

## 2011-08-01 DIAGNOSIS — R198 Other specified symptoms and signs involving the digestive system and abdomen: Secondary | ICD-10-CM | POA: Diagnosis not present

## 2011-08-06 ENCOUNTER — Ambulatory Visit (INDEPENDENT_AMBULATORY_CARE_PROVIDER_SITE_OTHER): Payer: Medicare Other | Admitting: Cardiology

## 2011-08-06 ENCOUNTER — Encounter: Payer: Self-pay | Admitting: Cardiology

## 2011-08-06 DIAGNOSIS — I080 Rheumatic disorders of both mitral and aortic valves: Secondary | ICD-10-CM | POA: Diagnosis not present

## 2011-08-06 DIAGNOSIS — R0989 Other specified symptoms and signs involving the circulatory and respiratory systems: Secondary | ICD-10-CM

## 2011-08-06 DIAGNOSIS — I4891 Unspecified atrial fibrillation: Secondary | ICD-10-CM | POA: Diagnosis not present

## 2011-08-06 DIAGNOSIS — I10 Essential (primary) hypertension: Secondary | ICD-10-CM

## 2011-08-06 NOTE — Assessment & Plan Note (Signed)
She tolerates anticoagulation. No change in therapy is indicated.

## 2011-08-06 NOTE — Progress Notes (Signed)
HPI The patient presents for one-year followup. Since I last saw her she has had no new cardiovascular complaints. She denies any chest pressure, neck or arm discomfort. She's had no new shortness of breath, PND or orthopnea. She notices no palpitations, presyncope or syncope.  Allergies  Allergen Reactions  . Actonel     unknown  . Atorvastatin     unknown  . Boniva (Ibandronate Sodium)     unknown  . Chocolate     unknown  . Lovastatin     unknown  . Pravastatin Sodium     unknown  . Toprol Xl (Metoprolol Succinate)     unknown  . Visken (Pindolol)     unknown  . Welchol (Colesevelam Hcl)     unknown    Current Outpatient Prescriptions  Medication Sig Dispense Refill  . b complex vitamins tablet Take 1 tablet by mouth daily.        . Cholecalciferol (VITAMIN D3) 1000 UNITS tablet Take 2,000 Units by mouth daily.        . furosemide (LASIX) 20 MG tablet Take 20 mg by mouth daily.        Marland Kitchen losartan (COZAAR) 50 MG tablet Take 1/2 tab daily      . vitamin C (ASCORBIC ACID) 500 MG tablet Take 500 mg by mouth daily.        . vitamin E 400 UNIT capsule Take 400 Units by mouth daily.      Marland Kitchen warfarin (COUMADIN) 5 MG tablet Take 5 mg by mouth daily.          Past Medical History  Diagnosis Date  . Atrial fibrillation   . Hypertension   . Heart failure, diastolic   . Carotid bruit   . S/P AV nodal ablation   . Hyperlipidemia   . Ovarian cyst   . Colon disorder     Adhesions  . QT prolongation     With solatol  . Nephrolithiasis   . Mitral regurgitation     Moderate    Past Surgical History  Procedure Date  . Pacemaker insertion     St. Jude  . Cystectomy     Ovarian  . Abdominal adhesion surgery   . Cataract extraction     Bilaterally  . Colon surgery     Colonic obstruction secondary to diverticulitis with resection    ROS:  Constipated.  Otherwise as stated in the HPI and negative for all other systems.  PHYSICAL EXAM BP 139/80  Pulse 72  Ht 5\' 6"   (1.676 m)  Wt 144 lb (65.318 kg)  BMI 23.24 kg/m2 GENERAL:  Well appearing HEENT:  Pupils equal round and reactive, fundi not visualized, oral mucosa unremarkable NECK:  No jugular venous distention, waveform within normal limits, carotid upstroke brisk and symmetric, no bruits, no thyromegaly LYMPHATICS:  No cervical, inguinal adenopathy LUNGS:  Clear to auscultation bilaterally BACK:  No CVA tenderness CHEST:  Well healed pacer pocket. HEART:  PMI not displaced or sustained,S1 and S2 within normal limits, no S3, clicks, no rubs, apical holosystolic murmur to the axilla ABD:  Flat, positive bowel sounds normal in frequency in pitch, no bruits, no rebound, no guarding, no midline pulsatile mass, no hepatomegaly, no splenomegaly EXT:  2 plus pulses throughout, no edema, no cyanosis no clubbing SKIN:  No rashes no nodules NEURO:  Cranial nerves II through XII grossly intact, motor grossly intact throughout PSYCH:  Cognitively intact, oriented to person place and time  ASSESSMENT AND PLAN

## 2011-08-06 NOTE — Assessment & Plan Note (Signed)
We discussed this again at this appointment. She would not want surgical treatment of this or referral for any extraordinary percutaneous procedures. She's not having any symptoms. No further imaging is indicated.

## 2011-08-06 NOTE — Patient Instructions (Signed)
Continue current medications as listed.  Follow up in 1 year with Dr Hochrein.  You will receive a letter in the mail 2 months before you are due.  Please call us when you receive this letter to schedule your follow up appointment.  

## 2011-08-06 NOTE — Assessment & Plan Note (Signed)
The blood pressure is at target. No change in medications is indicated. We will continue with therapeutic lifestyle changes (TLC).  

## 2011-08-06 NOTE — Assessment & Plan Note (Signed)
I reviewed carotid Dopplers that she had done last February and they were normal. No change in therapy is indicated.

## 2011-08-14 ENCOUNTER — Encounter: Payer: Self-pay | Admitting: Cardiology

## 2011-08-14 DIAGNOSIS — I4891 Unspecified atrial fibrillation: Secondary | ICD-10-CM | POA: Diagnosis not present

## 2011-08-14 DIAGNOSIS — E785 Hyperlipidemia, unspecified: Secondary | ICD-10-CM | POA: Diagnosis not present

## 2011-08-14 DIAGNOSIS — D0590 Unspecified type of carcinoma in situ of unspecified breast: Secondary | ICD-10-CM | POA: Diagnosis not present

## 2011-08-14 DIAGNOSIS — E559 Vitamin D deficiency, unspecified: Secondary | ICD-10-CM | POA: Diagnosis not present

## 2011-08-14 DIAGNOSIS — I10 Essential (primary) hypertension: Secondary | ICD-10-CM | POA: Diagnosis not present

## 2011-08-21 DIAGNOSIS — I4891 Unspecified atrial fibrillation: Secondary | ICD-10-CM | POA: Diagnosis not present

## 2011-08-27 DIAGNOSIS — R933 Abnormal findings on diagnostic imaging of other parts of digestive tract: Secondary | ICD-10-CM | POA: Diagnosis not present

## 2011-08-27 DIAGNOSIS — K6389 Other specified diseases of intestine: Secondary | ICD-10-CM | POA: Diagnosis not present

## 2011-08-27 DIAGNOSIS — R198 Other specified symptoms and signs involving the digestive system and abdomen: Secondary | ICD-10-CM | POA: Diagnosis not present

## 2011-08-28 DIAGNOSIS — H524 Presbyopia: Secondary | ICD-10-CM | POA: Diagnosis not present

## 2011-08-28 DIAGNOSIS — H5210 Myopia, unspecified eye: Secondary | ICD-10-CM | POA: Diagnosis not present

## 2011-08-28 DIAGNOSIS — H52229 Regular astigmatism, unspecified eye: Secondary | ICD-10-CM | POA: Diagnosis not present

## 2011-09-01 DIAGNOSIS — I4891 Unspecified atrial fibrillation: Secondary | ICD-10-CM | POA: Diagnosis not present

## 2011-09-16 DIAGNOSIS — R509 Fever, unspecified: Secondary | ICD-10-CM | POA: Diagnosis not present

## 2011-09-16 DIAGNOSIS — J029 Acute pharyngitis, unspecified: Secondary | ICD-10-CM | POA: Diagnosis not present

## 2011-09-23 DIAGNOSIS — L821 Other seborrheic keratosis: Secondary | ICD-10-CM | POA: Diagnosis not present

## 2011-09-23 DIAGNOSIS — L570 Actinic keratosis: Secondary | ICD-10-CM | POA: Diagnosis not present

## 2011-09-23 DIAGNOSIS — D1801 Hemangioma of skin and subcutaneous tissue: Secondary | ICD-10-CM | POA: Diagnosis not present

## 2011-09-29 DIAGNOSIS — I4891 Unspecified atrial fibrillation: Secondary | ICD-10-CM | POA: Diagnosis not present

## 2011-10-10 ENCOUNTER — Encounter: Payer: Self-pay | Admitting: Internal Medicine

## 2011-10-10 DIAGNOSIS — I495 Sick sinus syndrome: Secondary | ICD-10-CM

## 2011-10-16 DIAGNOSIS — I4891 Unspecified atrial fibrillation: Secondary | ICD-10-CM | POA: Diagnosis not present

## 2011-11-06 DIAGNOSIS — I4891 Unspecified atrial fibrillation: Secondary | ICD-10-CM | POA: Diagnosis not present

## 2011-11-07 DIAGNOSIS — Z1212 Encounter for screening for malignant neoplasm of rectum: Secondary | ICD-10-CM | POA: Diagnosis not present

## 2011-11-13 DIAGNOSIS — I4891 Unspecified atrial fibrillation: Secondary | ICD-10-CM | POA: Diagnosis not present

## 2011-11-24 DIAGNOSIS — I10 Essential (primary) hypertension: Secondary | ICD-10-CM | POA: Diagnosis not present

## 2011-11-24 DIAGNOSIS — D649 Anemia, unspecified: Secondary | ICD-10-CM | POA: Diagnosis not present

## 2011-11-24 DIAGNOSIS — I4891 Unspecified atrial fibrillation: Secondary | ICD-10-CM | POA: Diagnosis not present

## 2011-12-01 DIAGNOSIS — Z1212 Encounter for screening for malignant neoplasm of rectum: Secondary | ICD-10-CM | POA: Diagnosis not present

## 2011-12-12 ENCOUNTER — Other Ambulatory Visit: Payer: Self-pay | Admitting: Gastroenterology

## 2011-12-12 DIAGNOSIS — K921 Melena: Secondary | ICD-10-CM

## 2011-12-12 DIAGNOSIS — Z86010 Personal history of colonic polyps: Secondary | ICD-10-CM | POA: Diagnosis not present

## 2011-12-12 DIAGNOSIS — Z1211 Encounter for screening for malignant neoplasm of colon: Secondary | ICD-10-CM | POA: Diagnosis not present

## 2011-12-25 DIAGNOSIS — D649 Anemia, unspecified: Secondary | ICD-10-CM | POA: Diagnosis not present

## 2011-12-25 DIAGNOSIS — I4891 Unspecified atrial fibrillation: Secondary | ICD-10-CM | POA: Diagnosis not present

## 2011-12-30 DIAGNOSIS — N952 Postmenopausal atrophic vaginitis: Secondary | ICD-10-CM | POA: Diagnosis not present

## 2011-12-30 DIAGNOSIS — N3941 Urge incontinence: Secondary | ICD-10-CM | POA: Diagnosis not present

## 2012-01-01 ENCOUNTER — Ambulatory Visit
Admission: RE | Admit: 2012-01-01 | Discharge: 2012-01-01 | Disposition: A | Discharge status: Home / Self Care | Payer: Medicare Other | Source: Ambulatory Visit | Attending: Gastroenterology | Admitting: Gastroenterology

## 2012-01-01 DIAGNOSIS — K921 Melena: Secondary | ICD-10-CM | POA: Diagnosis not present

## 2012-01-01 DIAGNOSIS — K5730 Diverticulosis of large intestine without perforation or abscess without bleeding: Secondary | ICD-10-CM | POA: Diagnosis not present

## 2012-01-09 DIAGNOSIS — I495 Sick sinus syndrome: Secondary | ICD-10-CM

## 2012-01-26 DIAGNOSIS — R7989 Other specified abnormal findings of blood chemistry: Secondary | ICD-10-CM | POA: Diagnosis not present

## 2012-01-26 DIAGNOSIS — I4891 Unspecified atrial fibrillation: Secondary | ICD-10-CM | POA: Diagnosis not present

## 2012-02-16 DIAGNOSIS — N63 Unspecified lump in unspecified breast: Secondary | ICD-10-CM | POA: Diagnosis not present

## 2012-02-16 DIAGNOSIS — Z09 Encounter for follow-up examination after completed treatment for conditions other than malignant neoplasm: Secondary | ICD-10-CM | POA: Diagnosis not present

## 2012-03-01 DIAGNOSIS — D649 Anemia, unspecified: Secondary | ICD-10-CM | POA: Diagnosis not present

## 2012-03-01 DIAGNOSIS — I4891 Unspecified atrial fibrillation: Secondary | ICD-10-CM | POA: Diagnosis not present

## 2012-03-30 DIAGNOSIS — Z85828 Personal history of other malignant neoplasm of skin: Secondary | ICD-10-CM | POA: Diagnosis not present

## 2012-03-30 DIAGNOSIS — L821 Other seborrheic keratosis: Secondary | ICD-10-CM | POA: Diagnosis not present

## 2012-03-31 ENCOUNTER — Encounter: Payer: Self-pay | Admitting: Cardiology

## 2012-03-31 DIAGNOSIS — D649 Anemia, unspecified: Secondary | ICD-10-CM | POA: Diagnosis not present

## 2012-03-31 DIAGNOSIS — L84 Corns and callosities: Secondary | ICD-10-CM | POA: Diagnosis not present

## 2012-03-31 DIAGNOSIS — I4891 Unspecified atrial fibrillation: Secondary | ICD-10-CM | POA: Diagnosis not present

## 2012-03-31 DIAGNOSIS — I10 Essential (primary) hypertension: Secondary | ICD-10-CM | POA: Diagnosis not present

## 2012-03-31 DIAGNOSIS — E785 Hyperlipidemia, unspecified: Secondary | ICD-10-CM | POA: Diagnosis not present

## 2012-03-31 DIAGNOSIS — E559 Vitamin D deficiency, unspecified: Secondary | ICD-10-CM | POA: Diagnosis not present

## 2012-04-09 ENCOUNTER — Encounter: Payer: Self-pay | Admitting: Internal Medicine

## 2012-04-09 DIAGNOSIS — I495 Sick sinus syndrome: Secondary | ICD-10-CM | POA: Diagnosis not present

## 2012-04-16 ENCOUNTER — Telehealth: Payer: Self-pay

## 2012-04-16 NOTE — Telephone Encounter (Signed)
Patient aware of lab results.

## 2012-04-16 NOTE — Telephone Encounter (Signed)
Message copied by Yolonda Kida on Fri Apr 16, 2012 12:47 PM ------      Message from: Rollene Rotunda      Created: Sun Apr 11, 2012  9:32 PM       Labs OK.  Call Ms. Lorensen with the results and send results to [facility]

## 2012-04-19 DIAGNOSIS — M79609 Pain in unspecified limb: Secondary | ICD-10-CM | POA: Diagnosis not present

## 2012-04-19 DIAGNOSIS — L84 Corns and callosities: Secondary | ICD-10-CM | POA: Diagnosis not present

## 2012-05-06 DIAGNOSIS — Z23 Encounter for immunization: Secondary | ICD-10-CM | POA: Diagnosis not present

## 2012-05-06 DIAGNOSIS — I4891 Unspecified atrial fibrillation: Secondary | ICD-10-CM | POA: Diagnosis not present

## 2012-05-07 DIAGNOSIS — I495 Sick sinus syndrome: Secondary | ICD-10-CM | POA: Diagnosis not present

## 2012-05-31 DIAGNOSIS — I4891 Unspecified atrial fibrillation: Secondary | ICD-10-CM | POA: Diagnosis not present

## 2012-06-04 DIAGNOSIS — I495 Sick sinus syndrome: Secondary | ICD-10-CM

## 2012-06-14 DIAGNOSIS — I4891 Unspecified atrial fibrillation: Secondary | ICD-10-CM | POA: Diagnosis not present

## 2012-07-07 DIAGNOSIS — I10 Essential (primary) hypertension: Secondary | ICD-10-CM | POA: Diagnosis not present

## 2012-07-07 DIAGNOSIS — E785 Hyperlipidemia, unspecified: Secondary | ICD-10-CM | POA: Diagnosis not present

## 2012-07-07 DIAGNOSIS — R5383 Other fatigue: Secondary | ICD-10-CM | POA: Diagnosis not present

## 2012-07-07 DIAGNOSIS — I4891 Unspecified atrial fibrillation: Secondary | ICD-10-CM | POA: Diagnosis not present

## 2012-07-07 DIAGNOSIS — D649 Anemia, unspecified: Secondary | ICD-10-CM | POA: Diagnosis not present

## 2012-07-07 DIAGNOSIS — R5381 Other malaise: Secondary | ICD-10-CM | POA: Diagnosis not present

## 2012-07-07 DIAGNOSIS — E559 Vitamin D deficiency, unspecified: Secondary | ICD-10-CM | POA: Diagnosis not present

## 2012-07-09 DIAGNOSIS — I495 Sick sinus syndrome: Secondary | ICD-10-CM | POA: Diagnosis not present

## 2012-07-15 ENCOUNTER — Encounter: Payer: Self-pay | Admitting: *Deleted

## 2012-07-15 ENCOUNTER — Other Ambulatory Visit: Payer: Self-pay

## 2012-07-15 ENCOUNTER — Ambulatory Visit (INDEPENDENT_AMBULATORY_CARE_PROVIDER_SITE_OTHER): Payer: Medicare Other | Admitting: Cardiology

## 2012-07-15 ENCOUNTER — Ambulatory Visit (INDEPENDENT_AMBULATORY_CARE_PROVIDER_SITE_OTHER): Payer: Medicare Other | Admitting: *Deleted

## 2012-07-15 ENCOUNTER — Encounter: Payer: Self-pay | Admitting: Cardiology

## 2012-07-15 VITALS — BP 132/78 | HR 70

## 2012-07-15 DIAGNOSIS — Z45018 Encounter for adjustment and management of other part of cardiac pacemaker: Secondary | ICD-10-CM | POA: Diagnosis not present

## 2012-07-15 DIAGNOSIS — I442 Atrioventricular block, complete: Secondary | ICD-10-CM

## 2012-07-15 DIAGNOSIS — I4891 Unspecified atrial fibrillation: Secondary | ICD-10-CM | POA: Diagnosis not present

## 2012-07-15 DIAGNOSIS — Z950 Presence of cardiac pacemaker: Secondary | ICD-10-CM | POA: Diagnosis not present

## 2012-07-15 DIAGNOSIS — Z45010 Encounter for checking and testing of cardiac pacemaker pulse generator [battery]: Secondary | ICD-10-CM

## 2012-07-15 LAB — PACEMAKER DEVICE OBSERVATION
BMOD-0002RV: 9
DEVICE MODEL PM: 562100

## 2012-07-15 LAB — BASIC METABOLIC PANEL
BUN: 21 mg/dL (ref 6–23)
CO2: 33 mEq/L — ABNORMAL HIGH (ref 19–32)
Calcium: 9.6 mg/dL (ref 8.4–10.5)
Creatinine, Ser: 0.9 mg/dL (ref 0.4–1.2)
GFR: 60.55 mL/min (ref >60.00)
Glucose, Bld: 96 mg/dL (ref 70–99)

## 2012-07-15 LAB — CBC WITH DIFFERENTIAL/PLATELET
Basophils Absolute: 0 10*3/uL (ref 0.0–0.1)
Eosinophils Absolute: 0.1 10*3/uL (ref 0.0–0.7)
Lymphocytes Relative: 33.6 % (ref 12.0–46.0)
MCHC: 33.6 g/dL (ref 30.0–36.0)
Monocytes Relative: 13 % — ABNORMAL HIGH (ref 3.0–12.0)
Platelets: 218 10*3/uL (ref 150.0–400.0)
RDW: 14 % (ref 11.5–14.6)

## 2012-07-15 LAB — PROTIME-INR: Prothrombin Time: 23.6 s — ABNORMAL HIGH (ref 10.2–12.4)

## 2012-07-15 NOTE — Patient Instructions (Signed)
Generator change on 07/22/12

## 2012-07-15 NOTE — Progress Notes (Signed)
ELECTROPHYSIOLOGY OFFICE NOTE  Patient ID: [name] MRN: [id number], DOB/AGE: Oct 06, 1916   Date of Visit: 07/15/2012  Primary Physician: Rudi Heap, MD Primary Cardiologist: Graciela Husbands, MD Reason for Visit: EP/device follow-up  History of Present Illness  [name] is a pleasant 78 year old woman with HTN, dyslipidemia, diastolic HF, MR and symptomatic AFib s/p PPM implantation and AV node ablation. She is pacemaker dependent. She was seen in the device clinic today for PPM battery check and found to be at [facility]. She is accompanied by her daughter. She has no complaints. She denies CP, SOB, palpitations, dizziness, near syncope or syncope. She denies LE swelling, orthopnea or PND.   Past Medical History Past Medical History  Diagnosis Date  . Atrial fibrillation   . Hypertension   . Heart failure, diastolic   . Carotid bruit   . S/P AV nodal ablation   . Hyperlipidemia   . Ovarian cyst   . Colon disorder     Adhesions  . QT prolongation     With solatol  . Nephrolithiasis   . Mitral regurgitation     Moderate    Past Surgical History Past Surgical History  Procedure Date  . Pacemaker insertion     St. Jude  . Cystectomy     Ovarian  . Abdominal adhesion surgery   . Cataract extraction     Bilaterally  . Colon surgery     Colonic obstruction secondary to diverticulitis with resection     Allergies/Intolerances Allergies  Allergen Reactions  . Atorvastatin     unknown  . Boniva (Ibandronate Sodium)     unknown  . Chocolate     unknown  . Lovastatin     unknown  . Pravastatin Sodium     unknown  . Risedronate Sodium     unknown  . Toprol Xl (Metoprolol Succinate)     unknown  . Visken (Pindolol)     unknown  . Welchol (Colesevelam Hcl)     unknown    Current Home Medications Current Outpatient Prescriptions  Medication Sig Dispense Refill  . b complex vitamins tablet Take 1 tablet by mouth daily.        . Cholecalciferol (VITAMIN D3) 1000  UNITS tablet Take 2,000 Units by mouth daily.        . furosemide (LASIX) 20 MG tablet Take 20 mg by mouth daily.        Marland Kitchen losartan (COZAAR) 50 MG tablet Take 1/2 tab daily      . vitamin C (ASCORBIC ACID) 500 MG tablet Take 500 mg by mouth daily.        . vitamin E 400 UNIT capsule Take 400 Units by mouth daily.      Marland Kitchen warfarin (COUMADIN) 5 MG tablet Take 5 mg by mouth daily.          Social History Social History  . Marital Status: Divorced   Occupational History  . Retired    Social History Main Topics  . Smoking status: Never Smoker   . Smokeless tobacco: Never Used  . Alcohol Use: Not on file  . Drug Use: Not on file   Review of Systems General: No chills, fever, night sweats or weight changes Cardiovascular: No chest pain, dyspnea on exertion, edema, orthopnea, palpitations, paroxysmal nocturnal dyspnea Dermatological: No rash, lesions or masses Respiratory: No cough, dyspnea Urologic: No hematuria, dysuria Abdominal: No nausea, vomiting, diarrhea, bright red blood per rectum, melena, or hematemesis Neurologic: No visual changes,  weakness, changes in mental status All other systems reviewed and are otherwise negative except as noted above.  Physical Exam Blood pressure 132/78, pulse 70.  General: Well developed, well appearing 78 year old female in no acute distress. HEENT: Normocephalic, atraumatic. EOMs intact. Sclera nonicteric. Oropharynx clear.  Neck: Supple without bruits. No JVD. Lungs: Respirations regular and unlabored, CTA bilaterally. No wheezes, rales or rhonchi. Heart: Regular. S1, S2 present. III/VI systolic murmur. No diastolic murmur, rub, S3 or S4. Abdomen: Soft, non-distended.  Extremities: No clubbing, cyanosis or edema. DP/PT/Radials 2+ and equal bilaterally. Psych: Normal affect. Neuro: Alert and oriented X 3. Moves all extremities spontaneously.   Diagnostics Device interrogation performed in the device clinic today - battery voltage 2.52,  magnet rate 86 bpm; full interrogation of leads was done performed due to low battery voltage  Assessment and Plan 1. PPM battery at [facility] 2. Complete heart block s/p AV node ablation 3. Atrial fibrillation 4. Coagulopathy secondary to chronic Coumadin therapy 5. Mitral regurgitation [name]'s PPM battery is at [facility]. She is pacemaker dependent. She will need PPM generator change. In addition, her RV lead will need to be fully interrogated to document it is functioning normally. If her RV lead measurement/function is satisfactory, she will just need PPM generator change. If not, she will need PPM generator change and lead revision. This was all explained to [name] and her daughter in detail today. Risks, benefits and alternatives to PPM generator change were discussed in detail today. These risks include, but are not limited to, bleeding, infection and lead dislodgement. [name] and her daughter expressed verbal understanding and agree to proceed. This was scheduled with Dr. Graciela Husbands next week, 07/22/2012. She will hold her Coumadin 2 days prior to her procedure. She will have CBC, BMET, PT/INR today with repeat INR prior to her procedure next week.  The above plan of care was discussed and formulated with Dr. Johney Frame today. Signed, Jerol Rufener, Nehemiah Settle, PA-C 07/15/2012, 1:10 PM

## 2012-07-15 NOTE — Progress Notes (Signed)
PPM battery check only. 

## 2012-07-19 ENCOUNTER — Encounter (HOSPITAL_COMMUNITY): Payer: Self-pay | Admitting: Respiratory Therapy

## 2012-07-22 ENCOUNTER — Ambulatory Visit (HOSPITAL_COMMUNITY): Payer: Medicare Other

## 2012-07-22 ENCOUNTER — Encounter (HOSPITAL_COMMUNITY)
Admission: RE | Disposition: A | Discharge status: Home / Self Care | Payer: Self-pay | Source: Ambulatory Visit | Attending: Internal Medicine

## 2012-07-22 ENCOUNTER — Ambulatory Visit (HOSPITAL_COMMUNITY)
Admission: RE | Admit: 2012-07-22 | Discharge: 2012-07-22 | Disposition: A | Discharge status: Home / Self Care | Payer: Medicare Other | Source: Ambulatory Visit | Attending: Internal Medicine | Admitting: Internal Medicine

## 2012-07-22 DIAGNOSIS — Z45018 Encounter for adjustment and management of other part of cardiac pacemaker: Secondary | ICD-10-CM | POA: Insufficient documentation

## 2012-07-22 DIAGNOSIS — R0989 Other specified symptoms and signs involving the circulatory and respiratory systems: Secondary | ICD-10-CM | POA: Insufficient documentation

## 2012-07-22 DIAGNOSIS — E785 Hyperlipidemia, unspecified: Secondary | ICD-10-CM | POA: Diagnosis not present

## 2012-07-22 DIAGNOSIS — I5030 Unspecified diastolic (congestive) heart failure: Secondary | ICD-10-CM | POA: Diagnosis not present

## 2012-07-22 DIAGNOSIS — T82190A Other mechanical complication of cardiac electrode, initial encounter: Secondary | ICD-10-CM | POA: Diagnosis not present

## 2012-07-22 DIAGNOSIS — I10 Essential (primary) hypertension: Secondary | ICD-10-CM | POA: Diagnosis not present

## 2012-07-22 DIAGNOSIS — I4891 Unspecified atrial fibrillation: Secondary | ICD-10-CM | POA: Insufficient documentation

## 2012-07-22 DIAGNOSIS — I442 Atrioventricular block, complete: Secondary | ICD-10-CM

## 2012-07-22 DIAGNOSIS — I059 Rheumatic mitral valve disease, unspecified: Secondary | ICD-10-CM | POA: Diagnosis not present

## 2012-07-22 HISTORY — PX: PERMANENT PACEMAKER GENERATOR CHANGE: SHX6022

## 2012-07-22 LAB — PROTIME-INR: Prothrombin Time: 25 seconds — ABNORMAL HIGH (ref 11.6–15.2)

## 2012-07-22 LAB — SURGICAL PCR SCREEN: Staphylococcus aureus: NEGATIVE

## 2012-07-22 SURGERY — PERMANENT PACEMAKER GENERATOR CHANGE
Anesthesia: LOCAL

## 2012-07-22 MED ORDER — LIDOCAINE HCL (PF) 1 % IJ SOLN
INTRAMUSCULAR | Status: AC
  Filled 2012-07-22: qty 60

## 2012-07-22 MED ORDER — MUPIROCIN 2 % EX OINT
TOPICAL_OINTMENT | Freq: Two times a day (BID) | CUTANEOUS | Status: DC
  Administered 2012-07-22: 1 via NASAL
  Filled 2012-07-22: qty 22

## 2012-07-22 MED ORDER — ONDANSETRON HCL 4 MG/2ML IJ SOLN: 4.0000 mg | Freq: Four times a day (QID) | INTRAMUSCULAR | Status: DC | PRN

## 2012-07-22 MED ORDER — SODIUM CHLORIDE 0.9 % IR SOLN
80.0000 mg | Status: DC
  Filled 2012-07-22 (×2): qty 2

## 2012-07-22 MED ORDER — SODIUM CHLORIDE 0.9 % IV SOLN
INTRAVENOUS | Status: DC
Start: 2012-07-22 — End: 2012-07-22

## 2012-07-22 MED ORDER — ACETAMINOPHEN 325 MG PO TABS
325.0000 mg | ORAL_TABLET | ORAL | Status: DC | PRN
  Filled 2012-07-22: qty 2

## 2012-07-22 MED ORDER — SODIUM CHLORIDE 0.45 % IV SOLN
INTRAVENOUS | Status: DC
  Administered 2012-07-22: 11:00:00 via INTRAVENOUS

## 2012-07-22 MED ORDER — CEFAZOLIN SODIUM-DEXTROSE 2-3 GM-% IV SOLR
2.0000 g | INTRAVENOUS | Status: DC
  Filled 2012-07-22: qty 50

## 2012-07-22 MED ORDER — CHLORHEXIDINE GLUCONATE 4 % EX LIQD
60.0000 mL | Freq: Once | CUTANEOUS | Status: AC
  Administered 2012-07-22: 4 via TOPICAL

## 2012-07-22 MED ORDER — MIDAZOLAM HCL 5 MG/5ML IJ SOLN
INTRAMUSCULAR | Status: AC
  Filled 2012-07-22: qty 5

## 2012-07-22 NOTE — H&P (View-Only) (Signed)
 ELECTROPHYSIOLOGY OFFICE NOTE  Patient ID: [name] MRN: [id number], DOB/AGE: 12/19/1916   Date of Visit: 07/15/2012  Primary Physician: Donald Moore, MD Primary Cardiologist: Klein, MD Reason for Visit: EP/device follow-up  History of Present Illness  [name] is a pleasant 78 year old woman with HTN, dyslipidemia, diastolic HF, MR and symptomatic AFib s/p PPM implantation and AV node ablation. She is pacemaker dependent. She was seen in the device clinic today for PPM battery check and found to be at [facility]. She is accompanied by her daughter. She has no complaints. She denies CP, SOB, palpitations, dizziness, near syncope or syncope. She denies LE swelling, orthopnea or PND.   Past Medical History Past Medical History  Diagnosis Date  . Atrial fibrillation   . Hypertension   . Heart failure, diastolic   . Carotid bruit   . S/P AV nodal ablation   . Hyperlipidemia   . Ovarian cyst   . Colon disorder     Adhesions  . QT prolongation     With solatol  . Nephrolithiasis   . Mitral regurgitation     Moderate    Past Surgical History Past Surgical History  Procedure Date  . Pacemaker insertion     St. Jude  . Cystectomy     Ovarian  . Abdominal adhesion surgery   . Cataract extraction     Bilaterally  . Colon surgery     Colonic obstruction secondary to diverticulitis with resection     Allergies/Intolerances Allergies  Allergen Reactions  . Atorvastatin     unknown  . Boniva (Ibandronate Sodium)     unknown  . Chocolate     unknown  . Lovastatin     unknown  . Pravastatin Sodium     unknown  . Risedronate Sodium     unknown  . Toprol Xl (Metoprolol Succinate)     unknown  . Visken (Pindolol)     unknown  . Welchol (Colesevelam Hcl)     unknown    Current Home Medications Current Outpatient Prescriptions  Medication Sig Dispense Refill  . b complex vitamins tablet Take 1 tablet by mouth daily.        . Cholecalciferol (VITAMIN D3) 1000  UNITS tablet Take 2,000 Units by mouth daily.        . furosemide (LASIX) 20 MG tablet Take 20 mg by mouth daily.        . losartan (COZAAR) 50 MG tablet Take 1/2 tab daily      . vitamin C (ASCORBIC ACID) 500 MG tablet Take 500 mg by mouth daily.        . vitamin E 400 UNIT capsule Take 400 Units by mouth daily.      . warfarin (COUMADIN) 5 MG tablet Take 5 mg by mouth daily.          Social History Social History  . Marital Status: Divorced   Occupational History  . Retired    Social History Main Topics  . Smoking status: Never Smoker   . Smokeless tobacco: Never Used  . Alcohol Use: Not on file  . Drug Use: Not on file   Review of Systems General: No chills, fever, night sweats or weight changes Cardiovascular: No chest pain, dyspnea on exertion, edema, orthopnea, palpitations, paroxysmal nocturnal dyspnea Dermatological: No rash, lesions or masses Respiratory: No cough, dyspnea Urologic: No hematuria, dysuria Abdominal: No nausea, vomiting, diarrhea, bright red blood per rectum, melena, or hematemesis Neurologic: No visual changes,   weakness, changes in mental status All other systems reviewed and are otherwise negative except as noted above.  Physical Exam Blood pressure 132/78, pulse 70.  General: Well developed, well appearing 78 year old female in no acute distress. HEENT: Normocephalic, atraumatic. EOMs intact. Sclera nonicteric. Oropharynx clear.  Neck: Supple without bruits. No JVD. Lungs: Respirations regular and unlabored, CTA bilaterally. No wheezes, rales or rhonchi. Heart: Regular. S1, S2 present. III/VI systolic murmur. No diastolic murmur, rub, S3 or S4. Abdomen: Soft, non-distended.  Extremities: No clubbing, cyanosis or edema. DP/PT/Radials 2+ and equal bilaterally. Psych: Normal affect. Neuro: Alert and oriented X 3. Moves all extremities spontaneously.   Diagnostics Device interrogation performed in the device clinic today - battery voltage 2.52,  magnet rate 86 bpm; full interrogation of leads was done performed due to low battery voltage  Assessment and Plan 1. PPM battery at [facility] 2. Complete heart block s/p AV node ablation 3. Atrial fibrillation 4. Coagulopathy secondary to chronic Coumadin therapy 5. Mitral regurgitation [name]'s PPM battery is at [facility]. She is pacemaker dependent. She will need PPM generator change. In addition, her RV lead will need to be fully interrogated to document it is functioning normally. If her RV lead measurement/function is satisfactory, she will just need PPM generator change. If not, she will need PPM generator change and lead revision. This was all explained to [name] and her daughter in detail today. Risks, benefits and alternatives to PPM generator change were discussed in detail today. These risks include, but are not limited to, bleeding, infection and lead dislodgement. [name] and her daughter expressed verbal understanding and agree to proceed. This was scheduled with Dr. Klein next week, 07/22/2012. She will hold her Coumadin 2 days prior to her procedure. She will have CBC, BMET, PT/INR today with repeat INR prior to her procedure next week.  The above plan of care was discussed and formulated with Dr. Allred today. Signed, Kayley Zeiders, PA-C 07/15/2012, 1:10 PM   

## 2012-07-22 NOTE — CV Procedure (Signed)
Preoperative diagnosis complete heart block; sp pacer now at [facility] Postoperative diagnosis same//violation of lead insulation  Procedure: Generator replacement  /lead repair  Following informed consent the patient was brought to the electrophysiology laboratory in place of the fluoroscopic table in the supine position after routine prep and drape lidocaine was infiltrated in the region of the previous incision and carried down to later the device pocket using sharp dissection and electrocautery. The pocket was opened the device was freed up and was explanted.  Interrogation of the previously implanted ventricular lead [employer]   demonstrated an R wave of N/A millivolts., and impedance of 471ohms, and a pacing threshold of 0.7 volts at 0.4 msec.     The lead was  Inspected and discoloration of the lead was noted  Medical adhesive was deployed along the length of the exposed lead The lead was then attached to a [employer] ACC[employer] pulse generator, serial number [id number].    The pocket was irrigated with antibiotic containing saline solution hemostasis was assured and the lead  and the device were placed in the pocket. The wound is then closed in 3 layers in normal fashion.  The patient tolerated the procedure without apparent complication.  Sherryl Manges

## 2012-07-22 NOTE — Interval H&P Note (Signed)
History and Physical Interval Note:  07/22/2012 12:10 PM  [name]  has presented today for surgery, with the diagnosis of End of life  The various methods of treatment have been discussed with the patient and family. After consideration of risks, benefits and other options for treatment, the patient has consented to  Procedure(s) (LRB) with comments: PERMANENT PACEMAKER GENERATOR CHANGE (N/A) as a surgical intervention .  The patient's history has been reviewed, patient examined, no change in status, stable for surgery.  I have reviewed the patient's chart and labs.  Questions were answered to the patient's satisfaction.     Sherryl Manges

## 2012-07-23 ENCOUNTER — Ambulatory Visit (INDEPENDENT_AMBULATORY_CARE_PROVIDER_SITE_OTHER): Payer: Medicare Other | Admitting: *Deleted

## 2012-07-23 DIAGNOSIS — I495 Sick sinus syndrome: Secondary | ICD-10-CM

## 2012-07-23 NOTE — Progress Notes (Signed)
The patient was seen today for a wound check of her PPM change out done 07/22/12 with bleeding throught the steri-strips.  Dressing removed for 3cm spot of blood, no active bleeding or hematoma  noted.  Per Dr. Johney Frame, pressure dressing applied and to stay on until 07/26/12.  Patient is to call the office if any further bleeding noted or if hematoma develops.  Patient is in agreement.

## 2012-08-04 ENCOUNTER — Ambulatory Visit: Payer: Medicare Other

## 2012-08-05 ENCOUNTER — Encounter: Payer: Self-pay | Admitting: Internal Medicine

## 2012-08-05 ENCOUNTER — Ambulatory Visit (INDEPENDENT_AMBULATORY_CARE_PROVIDER_SITE_OTHER): Payer: Medicare Other | Admitting: *Deleted

## 2012-08-05 DIAGNOSIS — I442 Atrioventricular block, complete: Secondary | ICD-10-CM

## 2012-08-05 LAB — PACEMAKER DEVICE OBSERVATION
BATTERY VOLTAGE: 3.008 V
RV LEAD IMPEDENCE PM: 475 Ohm
VENTRICULAR PACING PM: 100

## 2012-08-05 NOTE — Progress Notes (Signed)
Wound check-PPM 

## 2012-08-06 ENCOUNTER — Encounter: Payer: Medicare Other | Admitting: Internal Medicine

## 2012-08-16 DIAGNOSIS — I4891 Unspecified atrial fibrillation: Secondary | ICD-10-CM | POA: Diagnosis not present

## 2012-08-23 ENCOUNTER — Encounter: Payer: Medicare Other | Admitting: Internal Medicine

## 2012-09-01 ENCOUNTER — Encounter: Payer: Self-pay | Admitting: Cardiology

## 2012-09-01 ENCOUNTER — Ambulatory Visit (INDEPENDENT_AMBULATORY_CARE_PROVIDER_SITE_OTHER): Payer: Medicare Other | Admitting: Cardiology

## 2012-09-01 ENCOUNTER — Encounter: Payer: Self-pay | Admitting: Internal Medicine

## 2012-09-01 VITALS — BP 150/90 | HR 70 | Ht 66.0 in | Wt 144.0 lb

## 2012-09-01 DIAGNOSIS — I080 Rheumatic disorders of both mitral and aortic valves: Secondary | ICD-10-CM | POA: Diagnosis not present

## 2012-09-01 DIAGNOSIS — R0989 Other specified symptoms and signs involving the circulatory and respiratory systems: Secondary | ICD-10-CM

## 2012-09-01 DIAGNOSIS — I442 Atrioventricular block, complete: Secondary | ICD-10-CM

## 2012-09-01 DIAGNOSIS — I4891 Unspecified atrial fibrillation: Secondary | ICD-10-CM

## 2012-09-01 DIAGNOSIS — I10 Essential (primary) hypertension: Secondary | ICD-10-CM | POA: Diagnosis not present

## 2012-09-01 NOTE — Progress Notes (Signed)
HPI The patient presents for one-year followup. Since I last saw her she has had no new cardiovascular complaints. She denies any chest pressure, neck or arm discomfort. She's had no new shortness of breath, PND or orthopnea. She notices no palpitations, presyncope or syncope.  She still does her cooking and light housekeeping. She gets tired with this but she says this is a gradual progression. She did have her pacemaker generator replaced recently. She did well with this.  Allergies  Allergen Reactions  . Atorvastatin     unknown  . Boniva (Ibandronate Sodium)     unknown  . Chocolate     unknown  . Lovastatin     unknown  . Pravastatin Sodium     unknown  . Risedronate Sodium     unknown  . Toprol Xl (Metoprolol Succinate)     unknown  . Visken (Pindolol)     unknown  . Welchol (Colesevelam Hcl)     unknown    Current Outpatient Prescriptions  Medication Sig Dispense Refill  . b complex vitamins tablet Take 1 tablet by mouth daily.       . Cholecalciferol (VITAMIN D3) 1000 UNITS tablet Take 2,000 Units by mouth daily.        . furosemide (LASIX) 20 MG tablet Take 20 mg by mouth daily.        Marland Kitchen losartan (COZAAR) 50 MG tablet Take 25 mg by mouth daily.       . vitamin C (ASCORBIC ACID) 500 MG tablet Take 500 mg by mouth daily.        . vitamin E 400 UNIT capsule Take 400 Units by mouth daily.      Marland Kitchen warfarin (COUMADIN) 5 MG tablet Take 2.5-5 mg by mouth daily. Take 2.5mg  on mon, wed, fri. Take 5mg  the rest of the week        Past Medical History  Diagnosis Date  . Atrial fibrillation   . Hypertension   . Heart failure, diastolic   . Carotid bruit   . S/P AV nodal ablation   . Hyperlipidemia   . Ovarian cyst   . Colon disorder     Adhesions  . QT prolongation     With solatol  . Nephrolithiasis   . Mitral regurgitation     Moderate    Past Surgical History  Procedure Date  . Pacemaker insertion     St. Jude  . Cystectomy     Ovarian  . Abdominal  adhesion surgery   . Cataract extraction     Bilaterally  . Colon surgery     Colonic obstruction secondary to diverticulitis with resection    ROS:  As stated in the HPI and negative for all other systems.  PHYSICAL EXAM There were no vitals taken for this visit. GENERAL:  Well appearing HEENT:  Pupils equal round and reactive, fundi not visualized, oral mucosa unremarkable NECK:  No jugular venous distention, waveform within normal limits, carotid upstroke brisk and symmetric, no bruits, transmitted systolic murmur on the right, no thyromegaly LYMPHATICS:  No cervical, inguinal adenopathy LUNGS:  Clear to auscultation bilaterally BACK:  No CVA tenderness CHEST:  Well healed pacer pocket. HEART:  PMI not displaced or sustained,S1 and S2 within normal limits, no S3, clicks, no rubs, apical holosystolic murmur to the axilla, apical systolic murmur mid peaking and radiating out the aortic outflow tract ABD:  Flat, positive bowel sounds normal in frequency in pitch, no bruits, no rebound, no guarding,  no midline pulsatile mass, no hepatomegaly, no splenomegaly EXT:  2 plus pulses throughout, no edema, no cyanosis no clubbing SKIN:  No rashes no nodules NEURO:  Cranial nerves II through XII grossly intact, motor grossly intact throughout PSYCH:  Cognitively intact, oriented to person place and time  ASSESSMENT AND PLAN  MITRAL REGURGITATION -  We discussed this again at this appointment. She indicates that she would want to have this repaired at least percutaneously in the future if ever needed. I doubt that he has progressed that much but given this I will do an echocardiogram this year.  AORTIC STENOSIS - This will be evaluated with the echocardiogram as above. I don't think she's having any overt symptoms. She would not be an open surgical candidate in the future but wouldn't be a TAVR candidate.  HYPERTENSION, BENIGN -  The blood pressure is at target. No change in medications is  indicated. We will continue with therapeutic lifestyle changes (TLC).   ATRIAL FIBRILLATION -  She tolerates anticoagulation. No change in therapy is indicated.  She does well with the warfarin and therefore I will not consider medication changes.

## 2012-09-01 NOTE — Patient Instructions (Addendum)
The current medical regimen is effective;  continue present plan and medications.  Your physician has requested that you have an echocardiogram. Echocardiography is a painless test that uses sound waves to create images of your heart. It provides your doctor with information about the size and shape of your heart and how well your heart's chambers and valves are working. This procedure takes approximately one hour. There are no restrictions for this procedure.  Please call the office to schedule once you decide which office you would like this test to be scheduled.  Follow up in 1 year with Dr Antoine Poche.  You will receive a letter in the mail 2 months before you are due.  Please call us when you receive this letter to schedule your follow up appointment.

## 2012-09-06 DIAGNOSIS — Z853 Personal history of malignant neoplasm of breast: Secondary | ICD-10-CM | POA: Diagnosis not present

## 2012-09-17 ENCOUNTER — Ambulatory Visit (INDEPENDENT_AMBULATORY_CARE_PROVIDER_SITE_OTHER): Payer: Medicare Other | Admitting: *Deleted

## 2012-09-17 ENCOUNTER — Ambulatory Visit (HOSPITAL_COMMUNITY): Payer: Medicare Other | Attending: Internal Medicine

## 2012-09-17 DIAGNOSIS — I4891 Unspecified atrial fibrillation: Secondary | ICD-10-CM | POA: Diagnosis not present

## 2012-09-17 DIAGNOSIS — I379 Nonrheumatic pulmonary valve disorder, unspecified: Secondary | ICD-10-CM | POA: Diagnosis not present

## 2012-09-17 DIAGNOSIS — I059 Rheumatic mitral valve disease, unspecified: Secondary | ICD-10-CM | POA: Insufficient documentation

## 2012-09-17 DIAGNOSIS — I509 Heart failure, unspecified: Secondary | ICD-10-CM | POA: Diagnosis not present

## 2012-09-17 DIAGNOSIS — I079 Rheumatic tricuspid valve disease, unspecified: Secondary | ICD-10-CM | POA: Diagnosis not present

## 2012-09-17 DIAGNOSIS — I10 Essential (primary) hypertension: Secondary | ICD-10-CM | POA: Insufficient documentation

## 2012-09-17 DIAGNOSIS — I442 Atrioventricular block, complete: Secondary | ICD-10-CM | POA: Diagnosis not present

## 2012-09-17 DIAGNOSIS — I359 Nonrheumatic aortic valve disorder, unspecified: Secondary | ICD-10-CM | POA: Diagnosis not present

## 2012-09-17 DIAGNOSIS — I080 Rheumatic disorders of both mitral and aortic valves: Secondary | ICD-10-CM

## 2012-09-17 NOTE — Progress Notes (Signed)
Echocardiogram performed.  

## 2012-09-20 DIAGNOSIS — I4891 Unspecified atrial fibrillation: Secondary | ICD-10-CM | POA: Diagnosis not present

## 2012-09-23 ENCOUNTER — Other Ambulatory Visit: Payer: Self-pay

## 2012-09-23 LAB — PACEMAKER DEVICE OBSERVATION
BMOD-0002RV: 10
BRDY-0004RV: 120 {beats}/min
BRDY-0005RV: 60 {beats}/min
DEVICE MODEL PM: 7331332
RV LEAD THRESHOLD: 0.625 V

## 2012-09-23 NOTE — Progress Notes (Signed)
Pacemaker interrogation only----pt having increased SOB.

## 2012-10-11 ENCOUNTER — Encounter: Payer: Self-pay | Admitting: Internal Medicine

## 2012-10-25 ENCOUNTER — Ambulatory Visit (INDEPENDENT_AMBULATORY_CARE_PROVIDER_SITE_OTHER): Payer: Medicare Other | Admitting: Pharmacist

## 2012-10-25 DIAGNOSIS — I4891 Unspecified atrial fibrillation: Secondary | ICD-10-CM | POA: Insufficient documentation

## 2012-11-22 ENCOUNTER — Other Ambulatory Visit: Payer: Self-pay | Admitting: Family Medicine

## 2012-11-29 ENCOUNTER — Ambulatory Visit (INDEPENDENT_AMBULATORY_CARE_PROVIDER_SITE_OTHER): Payer: Medicare Other | Admitting: Pharmacist

## 2012-11-29 DIAGNOSIS — I4891 Unspecified atrial fibrillation: Secondary | ICD-10-CM

## 2012-11-29 LAB — POCT INR: INR: 2.4

## 2012-11-29 NOTE — Patient Instructions (Addendum)
Anticoagulation Dose Instructions as of 11/29/2012     [name] Tue Wed Thu Fri Sat   New Dose 5 mg 2.5 mg 5 mg 2.5 mg 5 mg 2.5 mg 5 mg    Description       Continue same warfarin dose.       INR was 2.4 today

## 2012-12-04 ENCOUNTER — Other Ambulatory Visit: Payer: Self-pay | Admitting: Family Medicine

## 2012-12-13 ENCOUNTER — Encounter: Payer: Self-pay | Admitting: Family Medicine

## 2012-12-13 ENCOUNTER — Ambulatory Visit (INDEPENDENT_AMBULATORY_CARE_PROVIDER_SITE_OTHER): Payer: Medicare Other | Admitting: Family Medicine

## 2012-12-13 VITALS — BP 128/75 | HR 69 | Temp 96.9°F | Ht 64.0 in | Wt 145.3 lb

## 2012-12-13 DIAGNOSIS — R5383 Other fatigue: Secondary | ICD-10-CM | POA: Diagnosis not present

## 2012-12-13 DIAGNOSIS — I10 Essential (primary) hypertension: Secondary | ICD-10-CM

## 2012-12-13 DIAGNOSIS — E559 Vitamin D deficiency, unspecified: Secondary | ICD-10-CM | POA: Diagnosis not present

## 2012-12-13 DIAGNOSIS — R5381 Other malaise: Secondary | ICD-10-CM | POA: Diagnosis not present

## 2012-12-13 DIAGNOSIS — E785 Hyperlipidemia, unspecified: Secondary | ICD-10-CM | POA: Diagnosis not present

## 2012-12-13 DIAGNOSIS — R7989 Other specified abnormal findings of blood chemistry: Secondary | ICD-10-CM | POA: Diagnosis not present

## 2012-12-13 LAB — BASIC METABOLIC PANEL WITH GFR
CO2: 29 mEq/L (ref 19–32)
Calcium: 10.2 mg/dL (ref 8.4–10.5)
Glucose, Bld: 102 mg/dL — ABNORMAL HIGH (ref 70–99)
Sodium: 138 mEq/L (ref 135–145)

## 2012-12-13 LAB — THYROID PANEL WITH TSH
Free Thyroxine Index: 1.9 (ref 1.0–3.9)
T4, Total: 5.8 ug/dL (ref 5.0–12.5)
TSH: 3.737 u[IU]/mL (ref 0.350–4.500)

## 2012-12-13 LAB — POCT CBC
HCT, POC: 38.4 % (ref 37.7–47.9)
Hemoglobin: 13.1 g/dL (ref 12.2–16.2)
MCH, POC: 30.2 pg (ref 27–31.2)
MCHC: 34.2 g/dL (ref 31.8–35.4)
MCV: 88.3 fL (ref 80–97)

## 2012-12-13 LAB — HEPATIC FUNCTION PANEL
AST: 26 U/L (ref 0–37)
Alkaline Phosphatase: 57 U/L (ref 39–117)
Bilirubin, Direct: 0.2 mg/dL (ref 0.0–0.3)
Total Bilirubin: 1.1 mg/dL (ref 0.3–1.2)

## 2012-12-13 NOTE — Patient Instructions (Signed)
Always be careful and don't fall Take medications as directed Stay as  active as possible It is okay to take lecithin with Coumadin

## 2012-12-13 NOTE — Progress Notes (Signed)
  Subjective:    Patient ID: [name], female    DOB: 08/24/1916, 79 y.o.   MRN: [id number]  HPI Florabelle returns today for followup of chronic medical problems that are stable. She is followed periodically by Dr. Antoine Poche next  door. Her protimes are followed by this office.   Review of Systems  Constitutional: Positive for fatigue.  HENT: Postnasal drip: slight.   Eyes: Negative.   Respiratory: Negative.   Cardiovascular: Negative.   Endocrine: Negative.   Genitourinary: Negative.   Musculoskeletal: Negative.   Skin: Negative.   Allergic/Immunologic: Negative.   Neurological: Negative.   Psychiatric/Behavioral: Positive for sleep disturbance (due to restless leg syndrome).       Objective:   Physical Exam  Vitals reviewed. Constitutional: She is oriented to person, place, and time. She appears well-developed and well-nourished. No distress.  HENT:  Head: Normocephalic and atraumatic.  Right Ear: External ear normal.  Left Ear: External ear normal.  Nose: Nose normal.  Mouth/Throat: Oropharynx is clear and moist.  Eyes: Conjunctivae are normal. Right eye exhibits no discharge. No scleral icterus.  Neck: Normal range of motion. Neck supple. No thyromegaly present.  Cardiovascular: Normal rate and regular rhythm.  Exam reveals no gallop and no friction rub.   Murmur heard. Grade 2/6 systolic ejection murmur  Pulmonary/Chest: Effort normal and breath sounds normal. No respiratory distress. She has no wheezes. She has no rales. She exhibits no tenderness.  Abdominal: Soft. Bowel sounds are normal. She exhibits no distension and no mass. There is no tenderness. There is no rebound and no guarding.  She has a ventral hernia  Musculoskeletal: Normal range of motion. She exhibits no edema and no tenderness.  Lymphadenopathy:    She has no cervical adenopathy.  Neurological: She is alert and oriented to person, place, and time. She has normal reflexes.  Skin: Skin is warm  and dry. No rash noted. She is not diaphoretic. No erythema. No pallor.  Psychiatric: She has a normal mood and affect. Her behavior is normal. Thought content normal.          Assessment & Plan:  1. Elevated TSH - Thyroid Panel With TSH  2. Hypertension - BASIC METABOLIC PANEL WITH GFR; Standing - BASIC METABOLIC PANEL WITH GFR  3. Hyperlipemia - NMR Lipoprofile with Lipids; Standing - Hepatic function panel; Standing - NMR Lipoprofile with Lipids - Hepatic function panel  4. Vitamin D deficiency - Vitamin D 25 hydroxy; Standing - Vitamin D 25 hydroxy  5. Fatigue - POCT CBC; Standing - POCT CBC   Patient Instructions  Always be careful and don't fall Take medications as directed Stay as  active as possible It is okay to take lecithin with Coumadin

## 2012-12-17 LAB — NMR LIPOPROFILE WITH LIPIDS
Cholesterol, Total: 282 mg/dL — ABNORMAL HIGH (ref <200)
HDL Particle Number: 25.7 umol/L — ABNORMAL LOW (ref >=30.5)
Large HDL-P: 6.1 umol/L (ref >=4.8)
Large VLDL-P: <0.8 nmol/L (ref <=2.7)
Triglycerides: 110 mg/dL (ref <150)

## 2013-01-03 ENCOUNTER — Ambulatory Visit (INDEPENDENT_AMBULATORY_CARE_PROVIDER_SITE_OTHER): Payer: Medicare Other | Admitting: Pharmacist

## 2013-01-03 DIAGNOSIS — I4891 Unspecified atrial fibrillation: Secondary | ICD-10-CM | POA: Diagnosis not present

## 2013-01-03 NOTE — Patient Instructions (Signed)
Anticoagulation Dose Instructions as of 01/03/2013     [name] Tue Wed Thu Fri Sat   New Dose 5 mg 2.5 mg 5 mg 2.5 mg 5 mg 2.5 mg 5 mg    Description       Take extra 1/2 tablet today, then resume same warfarin dose of 1/2 tablet Mondays, Wednesdays and Fridays and 1 tablet all other days.       INR was 1.9 today

## 2013-01-07 ENCOUNTER — Other Ambulatory Visit: Payer: Self-pay | Admitting: Family Medicine

## 2013-01-14 ENCOUNTER — Ambulatory Visit (INDEPENDENT_AMBULATORY_CARE_PROVIDER_SITE_OTHER): Payer: Medicare Other | Admitting: General Practice

## 2013-01-14 VITALS — BP 145/80 | HR 69 | Temp 97.2°F | Ht 64.0 in | Wt 145.0 lb

## 2013-01-14 DIAGNOSIS — N390 Urinary tract infection, site not specified: Secondary | ICD-10-CM

## 2013-01-14 DIAGNOSIS — Z7901 Long term (current) use of anticoagulants: Secondary | ICD-10-CM | POA: Diagnosis not present

## 2013-01-14 DIAGNOSIS — R300 Dysuria: Secondary | ICD-10-CM

## 2013-01-14 DIAGNOSIS — R319 Hematuria, unspecified: Secondary | ICD-10-CM

## 2013-01-14 LAB — POCT UA - MICROSCOPIC ONLY
Epithelial cells, urine per micros: NEGATIVE
Mucus, UA: NEGATIVE
Yeast, UA: NEGATIVE

## 2013-01-14 LAB — POCT URINALYSIS DIPSTICK
Bilirubin, UA: NEGATIVE
Ketones, UA: NEGATIVE
Nitrite, UA: NEGATIVE
Spec Grav, UA: 1.02
pH, UA: 6.5

## 2013-01-14 MED ORDER — SULFAMETHOXAZOLE-TRIMETHOPRIM 800-160 MG PO TABS: 1.0000 | ORAL_TABLET | Freq: Two times a day (BID) | ORAL | Status: DC

## 2013-01-14 MED ORDER — PHENAZOPYRIDINE HCL 100 MG PO TABS: 100.0000 mg | ORAL_TABLET | Freq: Three times a day (TID) | ORAL | Status: DC | PRN

## 2013-01-14 MED ORDER — NITROFURANTOIN MONOHYD MACRO 100 MG PO CAPS: 100.0000 mg | ORAL_CAPSULE | Freq: Two times a day (BID) | ORAL | Status: DC

## 2013-01-14 NOTE — Progress Notes (Signed)
Subjective:    Patient ID: [name], female    DOB: 1916-09-04, 79 y.o.   MRN: [id number]  Urinary Tract Infection  This is a new problem. The current episode started yesterday. The problem occurs every urination. The problem has been gradually worsening. The quality of the pain is described as burning. The pain is at a severity of 6/10. There has been no fever. She is not sexually active. There is no history of pyelonephritis. Associated symptoms include chills, frequency, hematuria and urgency. Pertinent negatives include no flank pain, nausea or vomiting. She has tried nothing for the symptoms. There is no history of catheterization, kidney stones or recurrent UTIs.      Review of Systems  Constitutional: Positive for chills. Negative for fever.  Respiratory: Negative for cough and chest tightness.   Cardiovascular: Negative for chest pain and palpitations.  Gastrointestinal: Negative for nausea, vomiting, abdominal pain and blood in stool.  Genitourinary: Positive for dysuria, urgency, frequency and hematuria. Negative for flank pain and difficulty urinating.  Musculoskeletal: Negative for myalgias and back pain.  Neurological: Positive for weakness. Negative for dizziness, numbness and headaches.       Objective:   Physical Exam  Constitutional: She is oriented to person, place, and time. She appears well-developed and well-nourished.  HENT:  Head: Normocephalic and atraumatic.  Cardiovascular: Normal rate and regular rhythm.   Pulmonary/Chest: Effort normal and breath sounds normal. No respiratory distress. She exhibits no tenderness.  Abdominal: Soft. Bowel sounds are normal. She exhibits no distension. There is no tenderness.  Neurological: She is alert and oriented to person, place, and time.  Skin: Skin is warm and dry.  Psychiatric: She has a normal mood and affect.   Results for orders placed in visit on 01/14/13  POCT UA - MICROSCOPIC ONLY      Result Value  Range   WBC, Ur, HPF, POC 10-15     RBC, urine, microscopic tntc     Bacteria, U Microscopic neg     Mucus, UA neg     Epithelial cells, urine per micros neg     Crystals, Ur, HPF, POC neg     Casts, Ur, LPF, POC neg     Yeast, UA neg    POCT URINALYSIS DIPSTICK      Result Value Range   Color, UA red     Clarity, UA cloudy     Glucose, UA neg     Bilirubin, UA neg     Ketones, UA neg     Spec Grav, UA 1.020     Blood, UA large     pH, UA 6.5     Protein, UA large     Urobilinogen, UA negative     Nitrite, UA neg     Leukocytes, UA moderate (2+)           Assessment & Plan:  1. Dysuria - POCT UA - Microscopic Only - POCT urinalysis dipstick - phenazopyridine (PYRIDIUM) 100 MG tablet; Take 1 tablet (100 mg total) by mouth 3 (three) times daily as needed for pain.  Dispense: 10 tablet; Refill: 0  2. UTI (urinary tract infection) - sulfamethoxazole-trimethoprim (BACTRIM DS,SEPTRA DS) 800-160 MG per tablet; Take 1 tablet by mouth 2 (two) times daily.  Dispense: 10 tablet; Refill: 0  3. Long term (current) use of anticoagulants - POCT INR; Future  4. Hematuria - POCT INR -Increase fluid intake Frequent voiding Proper perineal hygiene RTO prn -RTO on Wednesday of next week  for follow up INR -Patient verbalized understanding -Coralie Keens, FNP-C

## 2013-01-14 NOTE — Patient Instructions (Addendum)
Urinary Tract Infection  Urinary tract infections (UTIs) can develop anywhere along your urinary tract. Your urinary tract is your body's drainage system for removing wastes and extra water. Your urinary tract includes two kidneys, two ureters, a bladder, and a urethra. Your kidneys are a pair of bean-shaped organs. Each kidney is about the size of your fist. They are located below your ribs, one on each side of your spine.  CAUSES  Infections are caused by microbes, which are microscopic organisms, including fungi, viruses, and bacteria. These organisms are so small that they can only be seen through a microscope. Bacteria are the microbes that most commonly cause UTIs.  SYMPTOMS   Symptoms of UTIs may vary by age and gender of the patient and by the location of the infection. Symptoms in young women typically include a frequent and intense urge to urinate and a painful, burning feeling in the bladder or urethra during urination. Older women and men are more likely to be tired, shaky, and weak and have muscle aches and abdominal pain. A fever may mean the infection is in your kidneys. Other symptoms of a kidney infection include pain in your back or sides below the ribs, nausea, and vomiting.  DIAGNOSIS  To diagnose a UTI, your caregiver will ask you about your symptoms. Your caregiver also will ask to provide a urine sample. The urine sample will be tested for bacteria and white blood cells. White blood cells are made by your body to help fight infection.  TREATMENT   Typically, UTIs can be treated with medication. Because most UTIs are caused by a bacterial infection, they usually can be treated with the use of antibiotics. The choice of antibiotic and length of treatment depend on your symptoms and the type of bacteria causing your infection.  HOME CARE INSTRUCTIONS   If you were prescribed antibiotics, take them exactly as your caregiver instructs you. Finish the medication even if you feel better after you  have only taken some of the medication.   Drink enough water and fluids to keep your urine clear or pale yellow.   Avoid caffeine, tea, and carbonated beverages. They tend to irritate your bladder.   Empty your bladder often. Avoid holding urine for long periods of time.   Empty your bladder before and after sexual intercourse.   After a bowel movement, women should cleanse from front to back. Use each tissue only once.  SEEK MEDICAL CARE IF:    You have back pain.   You develop a fever.   Your symptoms do not begin to resolve within 3 days.  SEEK IMMEDIATE MEDICAL CARE IF:    You have severe back pain or lower abdominal pain.   You develop chills.   You have nausea or vomiting.   You have continued burning or discomfort with urination.  MAKE SURE YOU:    Understand these instructions.   Will watch your condition.   Will get help right away if you are not doing well or get worse.  Document Released: 04/23/2005 Document Revised: 01/13/2012 Document Reviewed: 08/22/2011  [employer] Patient Information 2014 [employer]

## 2013-01-17 ENCOUNTER — Telehealth: Payer: Self-pay | Admitting: General Practice

## 2013-01-17 NOTE — Telephone Encounter (Signed)
appt made

## 2013-01-19 ENCOUNTER — Ambulatory Visit (INDEPENDENT_AMBULATORY_CARE_PROVIDER_SITE_OTHER): Payer: Medicare Other | Admitting: Pharmacist

## 2013-01-19 DIAGNOSIS — I4891 Unspecified atrial fibrillation: Secondary | ICD-10-CM

## 2013-01-19 NOTE — Progress Notes (Signed)
See anticoagulation clinic notes. 

## 2013-01-19 NOTE — Patient Instructions (Signed)
Anticoagulation Dose Instructions as of 01/19/2013     [name] Tue Wed Thu Fri Sat   New Dose 5 mg 2.5 mg 5 mg 2.5 mg 5 mg 2.5 mg 5 mg    Description       Continue current warfarin dose of 1/2 tablet Mondays, Wednesdays and Fridays and 1 tablet all other days.      INR was 2.8 today.

## 2013-02-03 ENCOUNTER — Ambulatory Visit (INDEPENDENT_AMBULATORY_CARE_PROVIDER_SITE_OTHER): Payer: Medicare Other | Admitting: Pharmacist

## 2013-02-03 DIAGNOSIS — I4891 Unspecified atrial fibrillation: Secondary | ICD-10-CM | POA: Diagnosis not present

## 2013-02-03 NOTE — Patient Instructions (Signed)
Anticoagulation Dose Instructions as of 02/03/2013     [name] Tue Wed Thu Fri Sat   New Dose 5 mg 2.5 mg 5 mg 2.5 mg 5 mg 2.5 mg 5 mg    Description       Continue current warfarin dose of 1/2 tablet Mondays, Wednesdays and Fridays and 1 tablet all other days.       INR was 2.0 today

## 2013-02-21 ENCOUNTER — Other Ambulatory Visit: Payer: Self-pay

## 2013-02-21 MED ORDER — WARFARIN SODIUM 5 MG PO TABS: ORAL_TABLET | ORAL | Status: DC

## 2013-02-21 MED ORDER — FUROSEMIDE 20 MG PO TABS: 20.0000 mg | ORAL_TABLET | Freq: Every day | ORAL | Status: DC

## 2013-03-07 ENCOUNTER — Ambulatory Visit (INDEPENDENT_AMBULATORY_CARE_PROVIDER_SITE_OTHER): Payer: Medicare Other | Admitting: Pharmacist

## 2013-03-07 DIAGNOSIS — I4891 Unspecified atrial fibrillation: Secondary | ICD-10-CM

## 2013-03-07 DIAGNOSIS — D649 Anemia, unspecified: Secondary | ICD-10-CM | POA: Diagnosis not present

## 2013-03-07 MED ORDER — INTEGRA 62.5-62.5-40-3 MG PO CAPS: ORAL_CAPSULE | ORAL | Status: DC

## 2013-03-07 MED ORDER — WARFARIN SODIUM 5 MG PO TABS: ORAL_TABLET | ORAL | Status: DC

## 2013-03-07 NOTE — Patient Instructions (Addendum)
Anticoagulation Dose Instructions as of 03/07/2013     Glynis Smiles Tue Wed Thu Fri Sat   New Dose 5 mg 2.5 mg 5 mg 2.5 mg 5 mg 2.5 mg 5 mg    Description       Take extra 1/2 tablet today, then continue current warfarin dose of 1/2 tablet Mondays, Wednesdays and Fridays and 1 tablet all other days.       INR wsa 1.9 today

## 2013-03-24 DIAGNOSIS — Z961 Presence of intraocular lens: Secondary | ICD-10-CM | POA: Diagnosis not present

## 2013-03-25 ENCOUNTER — Other Ambulatory Visit: Payer: Self-pay | Admitting: Family Medicine

## 2013-03-30 ENCOUNTER — Telehealth: Payer: Self-pay | Admitting: Internal Medicine

## 2013-03-30 NOTE — Telephone Encounter (Signed)
New problem    Patient went to dentist today root canal .     They prescribe her presidone . Daughter wants to know should she take this or not.

## 2013-03-31 DIAGNOSIS — M9981 Other biomechanical lesions of cervical region: Secondary | ICD-10-CM | POA: Diagnosis not present

## 2013-03-31 DIAGNOSIS — M542 Cervicalgia: Secondary | ICD-10-CM | POA: Diagnosis not present

## 2013-03-31 NOTE — Telephone Encounter (Signed)
LMTCB

## 2013-04-01 DIAGNOSIS — M9981 Other biomechanical lesions of cervical region: Secondary | ICD-10-CM | POA: Diagnosis not present

## 2013-04-01 DIAGNOSIS — M542 Cervicalgia: Secondary | ICD-10-CM | POA: Diagnosis not present

## 2013-04-04 DIAGNOSIS — M9981 Other biomechanical lesions of cervical region: Secondary | ICD-10-CM | POA: Diagnosis not present

## 2013-04-04 DIAGNOSIS — M542 Cervicalgia: Secondary | ICD-10-CM | POA: Diagnosis not present

## 2013-04-06 ENCOUNTER — Ambulatory Visit (INDEPENDENT_AMBULATORY_CARE_PROVIDER_SITE_OTHER): Payer: Self-pay | Admitting: Pharmacist

## 2013-04-06 ENCOUNTER — Encounter: Payer: Self-pay | Admitting: Family Medicine

## 2013-04-06 ENCOUNTER — Ambulatory Visit (INDEPENDENT_AMBULATORY_CARE_PROVIDER_SITE_OTHER): Payer: Medicare Other | Admitting: Family Medicine

## 2013-04-06 VITALS — BP 127/72 | HR 72 | Temp 98.1°F | Ht 64.0 in | Wt 143.0 lb

## 2013-04-06 DIAGNOSIS — R5381 Other malaise: Secondary | ICD-10-CM | POA: Diagnosis not present

## 2013-04-06 DIAGNOSIS — R531 Weakness: Secondary | ICD-10-CM

## 2013-04-06 DIAGNOSIS — R6884 Jaw pain: Secondary | ICD-10-CM

## 2013-04-06 DIAGNOSIS — R300 Dysuria: Secondary | ICD-10-CM | POA: Diagnosis not present

## 2013-04-06 DIAGNOSIS — I4891 Unspecified atrial fibrillation: Secondary | ICD-10-CM | POA: Diagnosis not present

## 2013-04-06 DIAGNOSIS — J029 Acute pharyngitis, unspecified: Secondary | ICD-10-CM

## 2013-04-06 LAB — POCT UA - MICROSCOPIC ONLY
Crystals, Ur, HPF, POC: NEGATIVE
Yeast, UA: NEGATIVE

## 2013-04-06 LAB — POCT URINALYSIS DIPSTICK
Bilirubin, UA: NEGATIVE
Blood, UA: NEGATIVE
Glucose, UA: NEGATIVE
Ketones, UA: NEGATIVE
Leukocytes, UA: NEGATIVE
Nitrite, UA: NEGATIVE
Protein, UA: NEGATIVE
Spec Grav, UA: 1.015
Urobilinogen, UA: NEGATIVE
pH, UA: 5

## 2013-04-06 LAB — POCT CBC
Granulocyte percent: 62.2 %G (ref 37–80)
HCT, POC: 39.3 % (ref 37.7–47.9)
MCH, POC: 28.8 pg (ref 27–31.2)
MCV: 88.5 fL (ref 80–97)
RBC: 4.4 M/uL (ref 4.04–5.48)
RDW, POC: 13.3 %
WBC: 10.9 10*3/uL — AB (ref 4.6–10.2)

## 2013-04-06 NOTE — Progress Notes (Signed)
Subjective:    Patient ID: Roanna Epley, female    DOB: Jul 24, 1917, 79 y.o.   MRN: [id number]  HPI Patient comes to clinic today with her daughter because of weakness. She recently had a repeat now and developed a stiff jaw. She's been taking a tapering dose of prednisone and has developed a sore throat. She feels very weak . As of note the dentist who did a root canal is following her very closely according to the daughter.   Review of Systems  Constitutional: Positive for appetite change ([place], cant eat) and fatigue.  HENT: Positive for sore throat and dental problem (recent dental surgury).   Eyes: Negative.   Respiratory: Negative.   Cardiovascular: Negative.   Gastrointestinal: Negative.   Endocrine: Negative.   Genitourinary: Positive for frequency.  Musculoskeletal: Negative.   Skin: Negative.   Allergic/Immunologic: Negative.   Neurological: Positive for weakness.  Hematological: Negative.   Psychiatric/Behavioral: Negative.        Objective:   Physical Exam  Constitutional: She is oriented to person, place, and time. No distress.  HENT:  Head: Normocephalic and atraumatic.  Right Ear: External ear normal.  Left Ear: External ear normal.  Nose: Nose normal.  Mouth/Throat: Oropharynx is clear and moist.  Strong gag reflex and unable to open up her mouth 22 pain in her left mandible  Eyes: Conjunctivae are normal. No scleral icterus.  Neck: Normal range of motion. Neck supple.  Tender left angle of the jaw  Cardiovascular: Normal rate, regular rhythm and normal heart sounds.  Exam reveals no friction rub.   No murmur heard. No atrial fib today  Pulmonary/Chest: Effort normal and breath sounds normal. No respiratory distress. She has no wheezes. She has no rales.  Abdominal: Soft. She exhibits no distension. There is no tenderness. There is no guarding.  Tympanic  Musculoskeletal: Normal range of motion. She exhibits no edema.  Lymphadenopathy:    She has no  cervical adenopathy.  Neurological: She is alert and oriented to person, place, and time.  Skin: Skin is warm and dry. No rash noted.  Psychiatric: She has a normal mood and affect. Her behavior is normal. Judgment and thought content normal.  All typical for this patient   There was no visualized thrush in the mouth. Direct visualization was difficult. Results for orders placed in visit on 04/06/13  POCT INR      Result Value Range   INR 2.9    POCT URINALYSIS DIPSTICK      Result Value Range   Color, UA yellow     Clarity, UA clear     Glucose, UA neg     Bilirubin, UA neg     Ketones, UA neg     Spec Grav, UA 1.015     Blood, UA neg     pH, UA 5.0     Protein, UA neg     Urobilinogen, UA negative     Nitrite, UA neg     Leukocytes, UA Negative    POCT UA - MICROSCOPIC ONLY      Result Value Range   WBC, Ur, HPF, POC occ     RBC, urine, microscopic 1-3     Bacteria, U Microscopic mod     Mucus, UA trace     Epithelial cells, urine per micros few     Crystals, Ur, HPF, POC neg     Casts, Ur, LPF, POC occ     Yeast, UA neg  POCT CBC      Result Value Range   WBC 10.9 (*) 4.6 - 10.2 K/uL   Lymph, poc 3.5 (*) 0.6 - 3.4   POC LYMPH PERCENT 32.2  10 - 50 %L   POC Granulocyte 6.8  2 - 6.9   Granulocyte percent 62.2  37 - 80 %G   RBC 4.4  4.04 - 5.48 M/uL   Hemoglobin 12.8  12.2 - 16.2 g/dL   HCT, POC 81.1  91.4 - 47.9 %   MCV 88.5  80 - 97 fL   MCH, POC 28.8  27 - 31.2 pg   MCHC 32.6  31.8 - 35.4 g/dL   RDW, POC 78.2     Platelet Count, POC 263.0  142 - 424 K/uL   MPV 6.5  0 - 99.8 fL  POCT RAPID STREP A (OFFICE)      Result Value Range   Rapid Strep A Screen Negative  Negative    CBC was within normal limit Rapid strep test was neg Urinalysis no significant pyuria BMP and LFTs are pending Patient and her daughter were both informed of lab findings     Assessment & Plan:  1. Afib - POCT INR  2. Dysuria - POCT urinalysis dipstick - POCT UA -  Microscopic Only  3. Weakness - BMP8+EGFR - Hepatic function panel - POCT CBC - POCT rapid strep A  4. Jaw pain  5. Sore throat - POCT CBC - POCT rapid strep A -Mycostatin oral suspension swish 1 teaspoon 4 times daily after meals and at bedtime and swallow  Patient Instructions  Continue current treatment her jaw and neck Use warm compresses is doing Take medication as directed   Nyra Capes MD

## 2013-04-06 NOTE — Patient Instructions (Signed)
Continue current treatment her jaw and neck Use warm compresses is doing Take medication as directed

## 2013-04-06 NOTE — Patient Instructions (Signed)
Anticoagulation Dose Instructions as of 04/06/2013     [name] Tue Wed Thu Fri Sat   New Dose 5 mg 2.5 mg 5 mg 2.5 mg 5 mg 2.5 mg 5 mg    Description       Hold for 1 day, then continue current warfarin dose of 1/2 tablet Mondays, Wednesdays and Fridays and 1 tablet all other days.      INR was 2.9 today

## 2013-04-07 DIAGNOSIS — M542 Cervicalgia: Secondary | ICD-10-CM | POA: Diagnosis not present

## 2013-04-07 DIAGNOSIS — M9981 Other biomechanical lesions of cervical region: Secondary | ICD-10-CM | POA: Diagnosis not present

## 2013-04-07 LAB — HEPATIC FUNCTION PANEL
ALT: 32 [IU]/L (ref 0–32)
AST: 25 [IU]/L (ref 0–40)
Albumin: 4.6 g/dL (ref 3.2–4.6)
Alkaline Phosphatase: 60 [IU]/L (ref 39–117)
Bilirubin, Direct: 0.26 mg/dL (ref 0.00–0.40)
Total Bilirubin: 0.9 mg/dL (ref 0.0–1.2)
Total Protein: 7 g/dL (ref 6.0–8.5)

## 2013-04-07 LAB — BMP8+EGFR
BUN: 32 mg/dL (ref 10–36)
Calcium: 9.6 mg/dL (ref 8.6–10.2)
Creatinine, Ser: 0.9 mg/dL (ref 0.57–1.00)
GFR calc non Af Amer: 55 mL/min/{1.73_m2} — ABNORMAL LOW (ref >59)
Glucose: 108 mg/dL — ABNORMAL HIGH (ref 65–99)
Sodium: 132 mmol/L — ABNORMAL LOW (ref 134–144)

## 2013-04-21 DIAGNOSIS — M542 Cervicalgia: Secondary | ICD-10-CM | POA: Diagnosis not present

## 2013-04-21 DIAGNOSIS — M9981 Other biomechanical lesions of cervical region: Secondary | ICD-10-CM | POA: Diagnosis not present

## 2013-04-25 ENCOUNTER — Ambulatory Visit (INDEPENDENT_AMBULATORY_CARE_PROVIDER_SITE_OTHER): Payer: Medicare Other | Admitting: Family Medicine

## 2013-04-25 ENCOUNTER — Encounter: Payer: Self-pay | Admitting: Family Medicine

## 2013-04-25 ENCOUNTER — Ambulatory Visit (INDEPENDENT_AMBULATORY_CARE_PROVIDER_SITE_OTHER): Payer: Medicare Other

## 2013-04-25 VITALS — BP 123/75 | HR 85 | Temp 98.5°F | Wt 144.8 lb

## 2013-04-25 DIAGNOSIS — I4891 Unspecified atrial fibrillation: Secondary | ICD-10-CM | POA: Diagnosis not present

## 2013-04-25 DIAGNOSIS — G2581 Restless legs syndrome: Secondary | ICD-10-CM

## 2013-04-25 DIAGNOSIS — I080 Rheumatic disorders of both mitral and aortic valves: Secondary | ICD-10-CM

## 2013-04-25 DIAGNOSIS — I10 Essential (primary) hypertension: Secondary | ICD-10-CM

## 2013-04-25 DIAGNOSIS — R0989 Other specified symptoms and signs involving the circulatory and respiratory systems: Secondary | ICD-10-CM

## 2013-04-25 DIAGNOSIS — R062 Wheezing: Secondary | ICD-10-CM

## 2013-04-25 DIAGNOSIS — R05 Cough: Secondary | ICD-10-CM

## 2013-04-25 DIAGNOSIS — R059 Cough, unspecified: Secondary | ICD-10-CM | POA: Diagnosis not present

## 2013-04-25 DIAGNOSIS — I442 Atrioventricular block, complete: Secondary | ICD-10-CM

## 2013-04-25 DIAGNOSIS — Z950 Presence of cardiac pacemaker: Secondary | ICD-10-CM

## 2013-04-25 MED ORDER — BUDESONIDE-FORMOTEROL FUMARATE 160-4.5 MCG/ACT IN AERO: 2.0000 | INHALATION_SPRAY | Freq: Two times a day (BID) | RESPIRATORY_TRACT | Status: DC

## 2013-04-25 NOTE — Progress Notes (Signed)
Patient ID: [name], female   DOB: 1917-02-10, 79 y.o.   MRN: [id number] SUBJECTIVE: CC: Chief Complaint  Patient presents with  . Acute Visit    states allergic to chocolate and then became wheezy and winded . states she stopped her laisx 1 month ago.    HPI: Has a h/o asthma in the past.several months since the last asthma attack. Allergic to feathers and chocolate. Ate a cake recently and didn't know it had chocolate in it. Started with asthma. Still persists. Mildly doesn't have any inhalers for quite some time because the asthma had quit.  stopped the lasix because it caused frequency,and she was buying too much depends and she was wetting herself. Doing fine for 1 month now.  Past Medical History  Diagnosis Date  . Atrial fibrillation   . Hypertension   . Heart failure, diastolic   . Carotid bruit   . S/P AV nodal ablation   . Hyperlipidemia   . Ovarian cyst   . Colon disorder     Adhesions  . QT prolongation     With solatol  . Nephrolithiasis   . Mitral regurgitation     Moderate   Past Surgical History  Procedure Laterality Date  . Pacemaker insertion      St. Jude  . Cystectomy      Ovarian  . Abdominal adhesion surgery    . Cataract extraction      Bilaterally  . Colon surgery      Colonic obstruction secondary to diverticulitis with resection   History   Social History  . Marital Status: Divorced    Spouse Name: N/A    Number of Children: N/A  . Years of Education: N/A   Occupational History  . Retired    Social History Main Topics  . Smoking status: Never Smoker   . Smokeless tobacco: Never Used  . Alcohol Use: No  . Drug Use: No  . Sexual Activity: Not on file   Other Topics Concern  . Not on file   Social History Narrative  . No narrative on file   Family History  Problem Relation Age of Onset  . Diabetes Mother   . Diabetes Father    Current Outpatient Prescriptions on File Prior to Visit  Medication Sig Dispense Refill   . b complex vitamins tablet Take 1 tablet by mouth daily.       . Cholecalciferol (VITAMIN D3) 1000 UNITS tablet Take 2,000 Units by mouth daily.        . Fe Fum-FePoly-Vit C-Vit B3 (INTEGRA) 62.5-62.5-40-3 MG CAPS TAKE ONE CAPSULE BY MOUTH ONE TIME DAILY  30 each  3  . fish oil-omega-3 fatty acids 1000 MG capsule Take 2 g by mouth daily.      . furosemide (LASIX) 20 MG tablet Take 1 tablet (20 mg total) by mouth daily.  30 tablet  2  . losartan (COZAAR) 50 MG tablet Take 25 mg by mouth daily.       . polyethylene glycol (MIRALAX / GLYCOLAX) packet Take 17 g by mouth 2 (two) times daily.      . vitamin C (ASCORBIC ACID) 500 MG tablet Take 500 mg by mouth daily.        . vitamin E 400 UNIT capsule Take 400 Units by mouth daily.      Marland Kitchen warfarin (COUMADIN) 5 MG tablet Take 1/2 or 1 tablet daily as directed by coumadin / anticoagulation clinic  30 tablet  3  No current facility-administered medications on file prior to visit.   Allergies  Allergen Reactions  . Atorvastatin     unknown  . Boniva [Ibandronate Sodium]     unknown  . Chocolate     unknown  . Lovastatin     unknown  . Pravastatin Sodium     unknown  . Risedronate Sodium     unknown  . Toprol Xl [Metoprolol Succinate]     unknown  . Visken [Pindolol]     unknown  . Welchol [Colesevelam Hcl]     unknown   Immunization History  Administered Date(s) Administered  . Influenza Whole 04/27/2010  . Pneumococcal Conjugate 07/28/1998  . Td 07/28/2000   Prior to Admission medications   Medication Sig Start Date End Date Taking? Authorizing Provider  b complex vitamins tablet Take 1 tablet by mouth daily.     Historical Provider, MD  Cholecalciferol (VITAMIN D3) 1000 UNITS tablet Take 2,000 Units by mouth daily.      Historical Provider, MD  Fe Fum-FePoly-Vit C-Vit B3 (INTEGRA) 62.5-62.5-40-3 MG CAPS TAKE ONE CAPSULE BY MOUTH ONE TIME DAILY 03/07/13   Ernestina Penna, MD  fish oil-omega-3 fatty acids 1000 MG capsule Take 2 g  by mouth daily.    Historical Provider, MD  furosemide (LASIX) 20 MG tablet Take 1 tablet (20 mg total) by mouth daily. 02/21/13   Ernestina Penna, MD  losartan (COZAAR) 50 MG tablet Take 25 mg by mouth daily.     Historical Provider, MD  polyethylene glycol (MIRALAX / GLYCOLAX) packet Take 17 g by mouth 2 (two) times daily.    Historical Provider, MD  vitamin C (ASCORBIC ACID) 500 MG tablet Take 500 mg by mouth daily.      Historical Provider, MD  vitamin E 400 UNIT capsule Take 400 Units by mouth daily.    Historical Provider, MD  warfarin (COUMADIN) 5 MG tablet Take 1/2 or 1 tablet daily as directed by coumadin / anticoagulation clinic 03/07/13   Ernestina Penna, MD     ROS: As above in the HPI. All other systems are stable or negative.  OBJECTIVE: APPEARANCE:  Patient in no acute distress.The patient appeared well nourished and normally developed. Acyanotic. Waist: VITAL SIGNS:BP 123/75  Pulse 85  Temp(Src) 98.5 F (36.9 C) (Oral)  Wt 144 lb 12.8 oz (65.681 kg)  BMI 24.84 kg/m2  SpO2 94% WF elderly. Looks good for her age.  SKIN: warm and  Dry without overt rashes, tattoos and scars  HEAD and Neck: without JVD, Head and scalp: normal Eyes:No scleral icterus. Fundi normal, eye movements normal. Ears: Auricle normal, canal normal, Tympanic membranes normal, insufflation normal. Nose: normal Throat: normal Neck & thyroid: normal  CHEST & LUNGS: Chest wall: normal Lungs: Coarse breath sounds. A"plopping " sound heard on end inspiration in the left chest. ? Pacer transmitted sounds from the movement of the pacemaker in the chest wall. Prolonged expiratory phase with a few end xpiratory wheeze.  CVS: Reveals the PMI to be normally located. Regular rhythm, First and Second Heart sounds are normal,  absence of murmurs, rubs or gallops.  ABDOMEN:  Appearance: normal Benign, no organomegaly, no masses, no Abdominal Aortic enlargement. No Guarding , no rebound. No Bruits. Bowel  sounds: normal  RECTAL: N/A GU: N/A  EXTREMETIES: nonedematous.  NEUROLOGIC: oriented to time,place and person; nonfocal.  ASSESSMENT: Atrial fibrillation  AV BLOCK, COMPLETE  HYPERTENSION, BENIGN  CAROTID BRUIT  MITRAL REGURGITATION  PACEMAKER, PERMANENT  Restless  leg syndrome  Cough - Plan: DG Chest 2 View  Wheeze - Plan: DG Chest 2 View   PLAN: . Orders Placed This Encounter  Procedures  . DG Chest 2 View    Standing Status: Future     Number of Occurrences: 1     Standing Expiration Date: 06/25/2014    Order Specific Question:  Reason for Exam (SYMPTOM  OR DIAGNOSIS REQUIRED)    Answer:  wheezing and congestoion    Order Specific Question:  Preferred imaging location?    Answer:  Internal    [facility] reading (PRIMARY) by  Dr. Modesto Charon: no acute findings. Chronic changes.                              Meds ordered this encounter  Medications  . budesonide-formoterol (SYMBICORT) 160-4.5 MCG/ACT inhaler    Sig: Inhale 2 puffs into the lungs 2 (two) times daily.    Dispense:  2 Inhaler    Refill:  0   demonstrated use of the inhaler. Samples given  Return in about 2 weeks (around 05/09/2013) for with Dr Christell Constant, [facility] medical problems.  Stanislawa Gaffin P. Modesto Charon, M.D.

## 2013-04-26 NOTE — Telephone Encounter (Signed)
OV scheduled for 10/6 at 9:00am

## 2013-04-27 ENCOUNTER — Telehealth: Payer: Self-pay | Admitting: Internal Medicine

## 2013-04-27 DIAGNOSIS — M9981 Other biomechanical lesions of cervical region: Secondary | ICD-10-CM | POA: Diagnosis not present

## 2013-04-27 DIAGNOSIS — M542 Cervicalgia: Secondary | ICD-10-CM | POA: Diagnosis not present

## 2013-04-27 NOTE — Telephone Encounter (Signed)
Spoke with Celine Mans about the need for follow up. I explained that normally there is a 6 month follow up visit with the physician and then at a year post implant. According to [facility], the patient does not want to come on the 6th because she has company coming in town. I advised them that it would be ok to reschedule but that it needed to be within the next month since she was already behind on follow up. She stated that they would keep this appointment.

## 2013-04-27 NOTE — Telephone Encounter (Signed)
New Problem  Pt was advised that she was over due for a pacer check////pt states her battery was put in last year the day after christmas so it hasnt been a year. Does not want the 10/6 appt. Request a call back to discuss.

## 2013-05-02 ENCOUNTER — Encounter: Payer: Self-pay | Admitting: Internal Medicine

## 2013-05-02 ENCOUNTER — Ambulatory Visit (INDEPENDENT_AMBULATORY_CARE_PROVIDER_SITE_OTHER): Payer: Medicare Other | Admitting: Internal Medicine

## 2013-05-02 ENCOUNTER — Telehealth: Payer: Self-pay | Admitting: *Deleted

## 2013-05-02 ENCOUNTER — Ambulatory Visit (INDEPENDENT_AMBULATORY_CARE_PROVIDER_SITE_OTHER)
Admission: RE | Admit: 2013-05-02 | Discharge: 2013-05-02 | Disposition: A | Discharge status: Home / Self Care | Payer: Medicare Other | Source: Ambulatory Visit | Attending: Internal Medicine | Admitting: Internal Medicine

## 2013-05-02 ENCOUNTER — Ambulatory Visit: Admission: RE | Admit: 2013-05-02 | Payer: Medicare Other | Source: Ambulatory Visit

## 2013-05-02 VITALS — BP 133/72 | HR 73 | Ht 66.0 in | Wt 143.1 lb

## 2013-05-02 DIAGNOSIS — I4891 Unspecified atrial fibrillation: Secondary | ICD-10-CM | POA: Diagnosis not present

## 2013-05-02 DIAGNOSIS — R062 Wheezing: Secondary | ICD-10-CM | POA: Diagnosis not present

## 2013-05-02 DIAGNOSIS — I442 Atrioventricular block, complete: Secondary | ICD-10-CM | POA: Diagnosis not present

## 2013-05-02 DIAGNOSIS — R42 Dizziness and giddiness: Secondary | ICD-10-CM

## 2013-05-02 DIAGNOSIS — R0602 Shortness of breath: Secondary | ICD-10-CM | POA: Insufficient documentation

## 2013-05-02 DIAGNOSIS — Z950 Presence of cardiac pacemaker: Secondary | ICD-10-CM | POA: Diagnosis not present

## 2013-05-02 DIAGNOSIS — M4802 Spinal stenosis, cervical region: Secondary | ICD-10-CM | POA: Diagnosis not present

## 2013-05-02 DIAGNOSIS — M47812 Spondylosis without myelopathy or radiculopathy, cervical region: Secondary | ICD-10-CM | POA: Diagnosis not present

## 2013-05-02 LAB — PACEMAKER DEVICE OBSERVATION
BATTERY VOLTAGE: 2.98 V
DEVICE MODEL PM: 7331332
VENTRICULAR PACING PM: 99

## 2013-05-02 LAB — BASIC METABOLIC PANEL
Calcium: 8.8 mg/dL (ref 8.4–10.5)
Creatinine, Ser: 0.8 mg/dL (ref 0.4–1.2)
GFR: 76.52 mL/min (ref >60.00)

## 2013-05-02 LAB — BRAIN NATRIURETIC PEPTIDE: Pro B Natriuretic peptide (BNP): 218 pg/mL — ABNORMAL HIGH (ref 0.0–100.0)

## 2013-05-02 MED ORDER — IOHEXOL 300 MG/ML  SOLN
76.0000 mL | Freq: Once | INTRAMUSCULAR | Status: AC | PRN
  Administered 2013-05-02: 76 mL via INTRAVENOUS

## 2013-05-02 NOTE — Assessment & Plan Note (Signed)
Stable post pacing 

## 2013-05-02 NOTE — Assessment & Plan Note (Signed)
Permanent. On anticoagulation 

## 2013-05-02 NOTE — Assessment & Plan Note (Signed)
The patient's device was interrogated.  The information was reviewed. No changes were made in the programming.    

## 2013-05-02 NOTE — Telephone Encounter (Signed)
Call report head ct,  No evidence of inflammation process, narrowing of pharynx, glottic region  or trachea. Sella app enlarged Recommend contrast CT. Told to fax result, agreed to fax.

## 2013-05-02 NOTE — Assessment & Plan Note (Signed)
The patient has shortness of breath and upper airway wheezing that temporally relates to an oral surgical procedure. The shortness of breath is particularly alarming. Her chest x-ray was unrevealing. We will get a BNP today We'll also get a CT scan with contrast of the neck looking for evidence of an oral pharyngeal process that might be causing shortness of breath in the upper extremity wheeze. She has no history of contrast allergy.

## 2013-05-02 NOTE — Patient Instructions (Addendum)
Your physician would like you to have a head/neck CT today.  Your physician recommends that you have lab work today: BMET  Your physician recommends that you continue on your current medications as directed. Please refer to the Current Medication list given to you today.  Your physician wants you to follow-up in: one year with Dr. Graciela Husbands.  You will receive a reminder letter in the mail two months in advance. If you don't receive a letter, please call our office to schedule the follow-up appointment.

## 2013-05-02 NOTE — Progress Notes (Signed)
Patient Care Team: Ernestina Penna, MD as PCP - General (Family Medicine)   HPI  [name] is a 79 y.o. female Seen in followup for complete heart block. She status post pacemaker and underwent generator replacement 12/13 at [facility].  She has a history of atrial fibrillation-permanent and status post AV junction ablation.  She has known moderate aortic stenosis. He is severe biatrial enlargement and moderate to severe tricuspid regurgitation by echo 2/14.  She comes in today for routine followup. She's had a difficult end of the summer that began when she had some oral surgery. She's been complaining of shortness of breath and "wheezing" over the last couple of days which has not improved significantly.  Chest x-ray was nonacute on 9/29/114  She also has had significant shortness of breath  Past Medical History  Diagnosis Date  . Atrial fibrillation   . Hypertension   . Heart failure, diastolic   . Carotid bruit   . S/P AV nodal ablation   . Hyperlipidemia   . Ovarian cyst   . Colon disorder     Adhesions  . QT prolongation     With solatol  . Nephrolithiasis   . Mitral regurgitation     Moderate    Past Surgical History  Procedure Laterality Date  . Pacemaker insertion      St. Jude  . Cystectomy      Ovarian  . Abdominal adhesion surgery    . Cataract extraction      Bilaterally  . Colon surgery      Colonic obstruction secondary to diverticulitis with resection    Current Outpatient Prescriptions  Medication Sig Dispense Refill  . b complex vitamins tablet Take 1 tablet by mouth daily.       . budesonide-formoterol (SYMBICORT) 160-4.5 MCG/ACT inhaler Inhale 2 puffs into the lungs 2 (two) times daily.  2 Inhaler  0  . Cholecalciferol (VITAMIN D3) 1000 UNITS tablet Take 2,000 Units by mouth daily.        . Fe Fum-FePoly-Vit C-Vit B3 (INTEGRA) 62.5-62.5-40-3 MG CAPS TAKE ONE CAPSULE BY MOUTH ONE TIME DAILY  30 each  3  . fish oil-omega-3 fatty acids 1000 MG  capsule Take 2 g by mouth daily.      . furosemide (LASIX) 20 MG tablet Take 20 mg by mouth daily. Pt changed the dosage herself to 1 tablet twice a week.      . losartan (COZAAR) 50 MG tablet Take 25 mg by mouth daily.       . polyethylene glycol (MIRALAX / GLYCOLAX) packet Take 17 g by mouth 2 (two) times daily.      . vitamin C (ASCORBIC ACID) 500 MG tablet Take 500 mg by mouth daily.        . vitamin E 400 UNIT capsule Take 400 Units by mouth daily.      Marland Kitchen warfarin (COUMADIN) 5 MG tablet Take 1/2 or 1 tablet daily as directed by coumadin / anticoagulation clinic  30 tablet  3   No current facility-administered medications for this visit.    Allergies  Allergen Reactions  . Atorvastatin     unknown  . Boniva [Ibandronate Sodium]     unknown  . Chocolate     unknown  . Lovastatin     unknown  . Pravastatin Sodium     unknown  . Risedronate Sodium     unknown  . Toprol Xl [Metoprolol Succinate]     unknown  .  Visken [Pindolol]     unknown  . Welchol [Colesevelam Hcl]     unknown    Review of Systems negative except from HPI and PMH  Physical Exam BP 133/72  Pulse 73  Ht 5\' 6"  (1.676 m)  Wt 143 lb 1.9 oz (64.919 kg)  BMI 23.11 kg/m2 Well developed and well nourished in no acute distress HENT normal E scleral and icterus clear Neck Supple JVP flat; carotids brisk and full Upper airway wheeze occasional crackles at the bases RRR  3  in a 6 murmur>>apex no murmurs gallops or rub Soft with active bowel sounds No clubbing cyanosis none Edema Alert and oriented, grossly normal motor and sensory function Skin Warm and Dry    Assessment and  Plan

## 2013-05-03 ENCOUNTER — Telehealth: Payer: Self-pay | Admitting: *Deleted

## 2013-05-03 NOTE — Telephone Encounter (Signed)
Informed patient of CT results, per Dr. Graciela Husbands. Patient verbalized understanding.

## 2013-05-04 ENCOUNTER — Telehealth: Payer: Self-pay | Admitting: Internal Medicine

## 2013-05-04 NOTE — Telephone Encounter (Signed)
I spoke with the pt's daughter and made her aware of lab and CT results.  She said the pt tried to explain the results to her but the pt did not fully understand.  I made Sonja aware that Dr Graciela Husbands did send CT results to Dr Christell Constant to determine if further follow-up or evaluation is needed.

## 2013-05-04 NOTE — Telephone Encounter (Signed)
New Problem  Pt's daughter is calling for results. And she states that the final encounter received noted that her mother experienced dizziness and she did not.. Request a call back to clarify.

## 2013-05-05 ENCOUNTER — Telehealth: Payer: Self-pay | Admitting: *Deleted

## 2013-05-05 ENCOUNTER — Ambulatory Visit (INDEPENDENT_AMBULATORY_CARE_PROVIDER_SITE_OTHER): Payer: Medicare Other | Admitting: Pharmacist

## 2013-05-05 DIAGNOSIS — Z23 Encounter for immunization: Secondary | ICD-10-CM

## 2013-05-05 DIAGNOSIS — I4891 Unspecified atrial fibrillation: Secondary | ICD-10-CM | POA: Diagnosis not present

## 2013-05-05 DIAGNOSIS — Z7901 Long term (current) use of anticoagulants: Secondary | ICD-10-CM | POA: Insufficient documentation

## 2013-05-05 LAB — POCT INR: INR: 2.2

## 2013-05-05 NOTE — Patient Instructions (Signed)
Anticoagulation Dose Instructions as of 05/05/2013     Glynis Smiles Tue Wed Thu Fri Sat   New Dose 5 mg 2.5 mg 5 mg 2.5 mg 5 mg 2.5 mg 5 mg    Description       Continue 1/2 tablet mondays, wednesdays and fridays.  1 tablet all other days.     INR was 2.2 today

## 2013-05-05 NOTE — Telephone Encounter (Signed)
Sent CT results to patient's PCP (Dr. Christell Constant)

## 2013-05-05 NOTE — Telephone Encounter (Signed)
Needs to followup with PCP prior to any more imaging Thanks  plz fax imaging report to PCP and make sure ( by call or retuern) that PCP has received inform althernatively let me know who it is and i can call

## 2013-05-06 DIAGNOSIS — M542 Cervicalgia: Secondary | ICD-10-CM | POA: Diagnosis not present

## 2013-05-06 DIAGNOSIS — M9981 Other biomechanical lesions of cervical region: Secondary | ICD-10-CM | POA: Diagnosis not present

## 2013-05-09 ENCOUNTER — Telehealth: Payer: Self-pay | Admitting: Internal Medicine

## 2013-05-09 NOTE — Telephone Encounter (Signed)
Explained that Dr. Graciela Husbands is referring further testing/additional images to PCP, Dr. Christell Constant. Ladell Pier tech is with that office and will relay message to Dr. Christell Constant.

## 2013-05-09 NOTE — Telephone Encounter (Signed)
New problem   [name] Tech want to know if Dr Graciela Husbands is ordering the additional images that was stated in the xray report for the pt. Please call

## 2013-05-13 DIAGNOSIS — M542 Cervicalgia: Secondary | ICD-10-CM | POA: Diagnosis not present

## 2013-05-13 DIAGNOSIS — M9981 Other biomechanical lesions of cervical region: Secondary | ICD-10-CM | POA: Diagnosis not present

## 2013-05-16 ENCOUNTER — Ambulatory Visit (INDEPENDENT_AMBULATORY_CARE_PROVIDER_SITE_OTHER): Payer: Medicare Other | Admitting: Family Medicine

## 2013-05-16 ENCOUNTER — Encounter: Payer: Self-pay | Admitting: Family Medicine

## 2013-05-16 VITALS — BP 141/80 | HR 74 | Temp 97.2°F | Ht 66.0 in | Wt 139.0 lb

## 2013-05-16 DIAGNOSIS — L98499 Non-pressure chronic ulcer of skin of other sites with unspecified severity: Secondary | ICD-10-CM | POA: Diagnosis not present

## 2013-05-16 DIAGNOSIS — R9389 Abnormal findings on diagnostic imaging of other specified body structures: Secondary | ICD-10-CM

## 2013-05-16 DIAGNOSIS — R930 Abnormal findings on diagnostic imaging of skull and head, not elsewhere classified: Secondary | ICD-10-CM

## 2013-05-16 DIAGNOSIS — Z7901 Long term (current) use of anticoagulants: Secondary | ICD-10-CM

## 2013-05-16 DIAGNOSIS — I4891 Unspecified atrial fibrillation: Secondary | ICD-10-CM

## 2013-05-16 DIAGNOSIS — R3915 Urgency of urination: Secondary | ICD-10-CM

## 2013-05-16 LAB — POCT UA - MICROSCOPIC ONLY
Casts, Ur, LPF, POC: NEGATIVE
Crystals, Ur, HPF, POC: NEGATIVE
RBC, urine, microscopic: NEGATIVE
Yeast, UA: NEGATIVE

## 2013-05-16 LAB — POCT URINALYSIS DIPSTICK
Blood, UA: NEGATIVE
Glucose, UA: NEGATIVE
Nitrite, UA: NEGATIVE
Urobilinogen, UA: NEGATIVE
pH, UA: 7

## 2013-05-16 NOTE — Addendum Note (Signed)
Addended by: Prescott Gum on: 05/16/2013 12:32 PM   Modules accepted: Orders

## 2013-05-16 NOTE — Addendum Note (Signed)
Addended by: Magdalene River on: 05/16/2013 11:54 AM   Modules accepted: Orders

## 2013-05-16 NOTE — Addendum Note (Signed)
Addended by: Magdalene River on: 05/16/2013 11:56 AM   Modules accepted: Orders

## 2013-05-16 NOTE — Patient Instructions (Addendum)
Continue current medications. Continue good therapeutic lifestyle changes.  Fall precautions discussed with patient. Follow up as planned and earlier as needed. Use corn pad, or shoe insert with opening in the center to take pressure off of callus of heel We will call you with the CT scan appointment Return to clinic if any redness occurs around excision of callus of heel Use antibiotic ointment to help soften callus

## 2013-05-16 NOTE — Addendum Note (Signed)
Addended by: Magdalene River on: 05/16/2013 11:48 AM   Modules accepted: Orders

## 2013-05-16 NOTE — Progress Notes (Signed)
Subjective:    Patient ID: [name], female    DOB: June 07, 1917, 79 y.o.   MRN: [id number]  HPI patient here today for follow up of ct-scan. The CT scan was discuss with her daughter and the patient. There was an indication that there might be an empty sella. The radiologist indicated that a further CT scan with contrast would be necessary. As far as her breathing she is doing better with this. She is still using Symbicort.   Patient Active Problem List   Diagnosis Date Noted  . Long term (current) use of anticoagulants 09/06/202014  . Shortness of breath 05/02/2013  . Restless leg syndrome 10/12/2010  . CAROTID BRUIT 08/21/2010  . MITRAL REGURGITATION 08/01/2009  . HYPERTENSION, BENIGN 08/17/2008  . AV BLOCK, COMPLETE 08/17/2008  . ATRIAL FIBRILLATION 08/17/2008  . PACEMAKER, PERMANENT 08/17/2008   Outpatient Encounter Prescriptions as of 05/16/2013  Medication Sig Dispense Refill  . b complex vitamins tablet Take 1 tablet by mouth daily.       . budesonide-formoterol (SYMBICORT) 160-4.5 MCG/ACT inhaler Inhale 2 puffs into the lungs 2 (two) times daily.  2 Inhaler  0  . Cholecalciferol (VITAMIN D3) 1000 UNITS tablet Take 2,000 Units by mouth daily.        . Fe Fum-FePoly-Vit C-Vit B3 (INTEGRA) 62.5-62.5-40-3 MG CAPS TAKE ONE CAPSULE BY MOUTH ONE TIME DAILY  30 each  3  . fish oil-omega-3 fatty acids 1000 MG capsule Take 2 g by mouth daily.      . furosemide (LASIX) 20 MG tablet Take 20 mg by mouth daily. Pt changed the dosage herself to 1 tablet twice a week.      . losartan (COZAAR) 50 MG tablet Take 25 mg by mouth daily.       . polyethylene glycol (MIRALAX / GLYCOLAX) packet Take 17 g by mouth 2 (two) times daily.      . vitamin C (ASCORBIC ACID) 500 MG tablet Take 500 mg by mouth daily.        . vitamin E 400 UNIT capsule Take 400 Units by mouth daily.      Marland Kitchen warfarin (COUMADIN) 5 MG tablet Take 1/2 or 1 tablet daily as directed by coumadin / anticoagulation clinic  30  tablet  3   No facility-administered encounter medications on file as of 05/16/2013.    Review of Systems  Constitutional: Negative.   HENT: Negative.   Eyes: Negative.   Respiratory: Negative.   Cardiovascular: Negative.   Gastrointestinal: Negative.   Endocrine: Negative.   Genitourinary: Positive for difficulty urinating (hard to control her bladder).  Musculoskeletal: Negative.        Right heel pain  Skin: Negative.   Allergic/Immunologic: Negative.   Neurological: Negative.   Hematological: Negative.   Psychiatric/Behavioral: Negative.    The results of the CT scan were discussed with patient and her daughter. And the need for an additional CT was discussed and agreed upon by both people.     Objective:   Physical Exam  Nursing note and vitals reviewed. Constitutional: She is oriented to person, place, and time. She appears well-developed and well-nourished. No distress.  HENT:  Head: Normocephalic.  Eyes: Right eye exhibits no discharge. Left eye exhibits no discharge. No scleral icterus.  Neck: Normal range of motion. Neck supple.  Cardiovascular: Normal rate.  Exam reveals no gallop and no friction rub.   Murmur heard. Patient has grade 3/6 systolic ejection murmur. The rhythm was slightly irregular at 72 per  minute.  Pulmonary/Chest: Effort normal and breath sounds normal. No respiratory distress. She has no wheezes. She has no rales.  Abdominal: Soft. Bowel sounds are normal. She exhibits no distension and no mass. There is no tenderness. There is no rebound and no guarding.  No suprapubic tenderness  Musculoskeletal: She exhibits no edema.  Neurological: She is alert and oriented to person, place, and time.  Skin: Skin is warm and dry. No rash noted. No erythema. No pallor.  There was a large callus on the plantar surface of the right heel  Psychiatric: She has a normal mood and affect. Her behavior is normal. Judgment and thought content normal.  All normal  for patient   BP 141/80  Pulse 74  Temp(Src) 97.2 F (36.2 C) (Oral)  Ht 5\' 6"  (1.676 m)  Wt 139 lb (63.05 kg)  BMI 22.45 kg/m2        Assessment & Plan:   1. Callous ulcer, with unspecified severity, right heel   2. Urinary urgency   3. Afib   4. Long term (current) use of anticoagulants   5. Abnormal CT scan     A repeat CT of the head with contrast will be scheduled Also a visit with the urologist because of the leaking and incontinence will be scheduled  Nyra Capes MD

## 2013-05-18 ENCOUNTER — Other Ambulatory Visit: Payer: Self-pay

## 2013-05-18 LAB — URINE CULTURE

## 2013-05-20 DIAGNOSIS — M9981 Other biomechanical lesions of cervical region: Secondary | ICD-10-CM | POA: Diagnosis not present

## 2013-05-20 DIAGNOSIS — M542 Cervicalgia: Secondary | ICD-10-CM | POA: Diagnosis not present

## 2013-05-23 ENCOUNTER — Ambulatory Visit (HOSPITAL_COMMUNITY)
Admission: RE | Admit: 2013-05-23 | Discharge: 2013-05-23 | Disposition: A | Discharge status: Home / Self Care | Payer: Medicare Other | Source: Ambulatory Visit | Attending: Family Medicine | Admitting: Family Medicine

## 2013-05-23 DIAGNOSIS — R220 Localized swelling, mass and lump, head: Secondary | ICD-10-CM | POA: Diagnosis not present

## 2013-05-23 DIAGNOSIS — I10 Essential (primary) hypertension: Secondary | ICD-10-CM | POA: Diagnosis not present

## 2013-05-23 DIAGNOSIS — C50919 Malignant neoplasm of unspecified site of unspecified female breast: Secondary | ICD-10-CM | POA: Insufficient documentation

## 2013-05-23 DIAGNOSIS — R930 Abnormal findings on diagnostic imaging of skull and head, not elsewhere classified: Secondary | ICD-10-CM | POA: Diagnosis not present

## 2013-05-23 DIAGNOSIS — D352 Benign neoplasm of pituitary gland: Secondary | ICD-10-CM | POA: Diagnosis not present

## 2013-05-23 MED ORDER — IOHEXOL 300 MG/ML  SOLN
75.0000 mL | Freq: Once | INTRAMUSCULAR | Status: AC | PRN
  Administered 2013-05-23: 75 mL via INTRAVENOUS

## 2013-05-26 ENCOUNTER — Other Ambulatory Visit: Payer: Self-pay | Admitting: *Deleted

## 2013-05-27 DIAGNOSIS — M542 Cervicalgia: Secondary | ICD-10-CM | POA: Diagnosis not present

## 2013-05-27 DIAGNOSIS — M9981 Other biomechanical lesions of cervical region: Secondary | ICD-10-CM | POA: Diagnosis not present

## 2013-05-30 ENCOUNTER — Telehealth: Payer: Self-pay | Admitting: Family Medicine

## 2013-05-30 NOTE — Telephone Encounter (Signed)
Dr Christell Constant spoke with daughter, we called Dr Jeral Fruit, and daughter re-called - pt to see dr Jeral Fruit on 06-13-13 at 2:20 in [place].

## 2013-05-30 NOTE — Telephone Encounter (Signed)
Dr Christell Constant   Please review and advise

## 2013-06-03 DIAGNOSIS — M9981 Other biomechanical lesions of cervical region: Secondary | ICD-10-CM | POA: Diagnosis not present

## 2013-06-03 DIAGNOSIS — M542 Cervicalgia: Secondary | ICD-10-CM | POA: Diagnosis not present

## 2013-06-15 ENCOUNTER — Ambulatory Visit (INDEPENDENT_AMBULATORY_CARE_PROVIDER_SITE_OTHER): Payer: Medicare Other | Admitting: Pharmacist

## 2013-06-15 ENCOUNTER — Ambulatory Visit: Payer: PRIVATE HEALTH INSURANCE | Admitting: Family Medicine

## 2013-06-15 DIAGNOSIS — Z7901 Long term (current) use of anticoagulants: Secondary | ICD-10-CM

## 2013-06-15 DIAGNOSIS — I4891 Unspecified atrial fibrillation: Secondary | ICD-10-CM | POA: Diagnosis not present

## 2013-06-15 NOTE — Patient Instructions (Signed)
Anticoagulation Dose Instructions as of 06/15/2013     [name] Tue Wed Thu Fri Sat   New Dose 5 mg 2.5 mg 5 mg 2.5 mg 5 mg 2.5 mg 5 mg    Description       Continue 1/2 tablet mondays, wednesdays and fridays.  1 tablet all other days.      INR was 2.0 today

## 2013-07-01 ENCOUNTER — Encounter: Payer: Self-pay | Admitting: *Deleted

## 2013-07-27 ENCOUNTER — Ambulatory Visit (INDEPENDENT_AMBULATORY_CARE_PROVIDER_SITE_OTHER): Payer: Medicare Other | Admitting: Pharmacist

## 2013-07-27 DIAGNOSIS — Z7901 Long term (current) use of anticoagulants: Secondary | ICD-10-CM

## 2013-07-27 DIAGNOSIS — I4891 Unspecified atrial fibrillation: Secondary | ICD-10-CM

## 2013-07-27 DIAGNOSIS — D649 Anemia, unspecified: Secondary | ICD-10-CM | POA: Diagnosis not present

## 2013-07-27 LAB — POCT INR: INR: 3

## 2013-07-27 MED ORDER — INTEGRA 62.5-62.5-40-3 MG PO CAPS: ORAL_CAPSULE | ORAL | Status: DC

## 2013-07-27 NOTE — Patient Instructions (Signed)
Anticoagulation Dose Instructions as of 07/27/2013     [name] Tue Wed Thu Fri Sat   New Dose 5 mg 2.5 mg 5 mg 2.5 mg 5 mg 2.5 mg 5 mg    Description       Continue 1/2 tablet mondays, wednesdays and fridays.  1 tablet all other days.      INR was 3.0 today

## 2013-08-03 ENCOUNTER — Encounter: Payer: Medicare Other | Admitting: *Deleted

## 2013-08-10 ENCOUNTER — Encounter: Payer: Self-pay | Admitting: *Deleted

## 2013-08-19 ENCOUNTER — Ambulatory Visit (INDEPENDENT_AMBULATORY_CARE_PROVIDER_SITE_OTHER): Payer: Medicare Other | Admitting: *Deleted

## 2013-08-19 DIAGNOSIS — I4891 Unspecified atrial fibrillation: Secondary | ICD-10-CM | POA: Diagnosis not present

## 2013-08-19 DIAGNOSIS — I442 Atrioventricular block, complete: Secondary | ICD-10-CM

## 2013-08-19 LAB — MDC_IDC_ENUM_SESS_TYPE_REMOTE
Battery Remaining Longevity: 137 mo
Brady Statistic RV Percent Paced: 99 %
Implantable Pulse Generator Model: 1110
Implantable Pulse Generator Serial Number: 7331332
Lead Channel Impedance Value: 450 Ohm
Lead Channel Sensing Intrinsic Amplitude: 9 mV
Lead Channel Setting Pacing Amplitude: 0.875
Lead Channel Setting Pacing Pulse Width: 0.4 ms
Lead Channel Setting Sensing Sensitivity: 4 mV
MDC IDC MSMT BATTERY VOLTAGE: 2.98 V
MDC IDC MSMT LEADCHNL RV PACING THRESHOLD AMPLITUDE: 0.625 V
MDC IDC MSMT LEADCHNL RV PACING THRESHOLD PULSEWIDTH: 0.4 ms
MDC IDC SESS DTM: 20150123214842

## 2013-08-29 ENCOUNTER — Encounter: Payer: Self-pay | Admitting: Pharmacist

## 2013-08-29 ENCOUNTER — Ambulatory Visit (INDEPENDENT_AMBULATORY_CARE_PROVIDER_SITE_OTHER): Payer: Medicare Other | Admitting: Pharmacist

## 2013-08-29 VITALS — BP 140/78 | HR 72 | Ht 66.0 in | Wt 148.0 lb

## 2013-08-29 DIAGNOSIS — I4891 Unspecified atrial fibrillation: Secondary | ICD-10-CM | POA: Diagnosis not present

## 2013-08-29 DIAGNOSIS — Z7901 Long term (current) use of anticoagulants: Secondary | ICD-10-CM

## 2013-08-29 DIAGNOSIS — Z0000 Encounter for general adult medical examination without abnormal findings: Secondary | ICD-10-CM | POA: Diagnosis not present

## 2013-08-29 LAB — POCT INR: INR: 2.2

## 2013-08-29 MED ORDER — WARFARIN SODIUM 5 MG PO TABS: ORAL_TABLET | ORAL | Status: DC

## 2013-08-29 NOTE — Progress Notes (Signed)
Subjective:    [name] is a 80 y.o. female who presents for Medicare Initial preventive examination.  Preventive Screening-Counseling & Management  Tobacco History  Smoking status  . Never Smoker   Smokeless tobacco  . Never Used      Current Problems (verified) Patient Active Problem List   Diagnosis Date Noted  . Long term (current) use of anticoagulants 03-13-202014  . Shortness of breath 05/02/2013  . Restless leg syndrome 10/12/2010  . CAROTID BRUIT 08/21/2010  . MITRAL REGURGITATION 08/01/2009  . HYPERTENSION, BENIGN 08/17/2008  . AV BLOCK, COMPLETE 08/17/2008  . ATRIAL FIBRILLATION 08/17/2008  . PACEMAKER, PERMANENT 08/17/2008    Medications Prior to Visit Current Outpatient Prescriptions on File Prior to Visit  Medication Sig Dispense Refill  . b complex vitamins tablet Take 1 tablet by mouth daily.       . Cholecalciferol (VITAMIN D3) 1000 UNITS tablet Take 2,000 Units by mouth daily.        . Fe Fum-FePoly-Vit C-Vit B3 (INTEGRA) 62.5-62.5-40-3 MG CAPS TAKE ONE CAPSULE BY MOUTH ONE TIME DAILY  30 each  3  . fish oil-omega-3 fatty acids 1000 MG capsule Take 2 g by mouth daily.      . furosemide (LASIX) 20 MG tablet Take 20 mg by mouth daily. Pt changed the dosage herself to 1 tablet twice a week.      . losartan (COZAAR) 50 MG tablet Take 25 mg by mouth daily.       . polyethylene glycol (MIRALAX / GLYCOLAX) packet Take 17 g by mouth 2 (two) times daily.      . vitamin C (ASCORBIC ACID) 500 MG tablet Take 500 mg by mouth daily.        . vitamin E 400 UNIT capsule Take 400 Units by mouth daily.       No current facility-administered medications on file prior to visit.    Current Medications (verified) Current Outpatient Prescriptions  Medication Sig Dispense Refill  . b complex vitamins tablet Take 1 tablet by mouth daily.       . Cholecalciferol (VITAMIN D3) 1000 UNITS tablet Take 2,000 Units by mouth daily.        . Fe Fum-FePoly-Vit C-Vit B3 (INTEGRA)  62.5-62.5-40-3 MG CAPS TAKE ONE CAPSULE BY MOUTH ONE TIME DAILY  30 each  3  . fish oil-omega-3 fatty acids 1000 MG capsule Take 2 g by mouth daily.      . furosemide (LASIX) 20 MG tablet Take 20 mg by mouth daily. Pt changed the dosage herself to 1 tablet twice a week.      . losartan (COZAAR) 50 MG tablet Take 25 mg by mouth daily.       . polyethylene glycol (MIRALAX / GLYCOLAX) packet Take 17 g by mouth 2 (two) times daily.      . vitamin C (ASCORBIC ACID) 500 MG tablet Take 500 mg by mouth daily.        . vitamin E 400 UNIT capsule Take 400 Units by mouth daily.      Marland Kitchen warfarin (COUMADIN) 5 MG tablet Take 1/2 or 1 tablet daily as directed by coumadin / anticoagulation clinic  90 tablet  1   No current facility-administered medications for this visit.     Allergies (verified) Atorvastatin; Boniva; Chocolate; Lovastatin; Pravastatin sodium; Risedronate sodium; Toprol xl; Visken; and Welchol   PAST HISTORY  Family History Family History  Problem Relation Age of Onset  . Diabetes Mother  type 2  . Diabetes Father     type 2  . Cancer Sister 33    colon    Social History History  Substance Use Topics  . Smoking status: Never Smoker   . Smokeless tobacco: Never Used  . Alcohol Use: No     Are there smokers in your home (other than you)? No  Risk Factors Current exercise habits: Home exercise routine includes walking 0.5 hrs per day.  She also still does balance exercises that PT taught her after hip surgery. Dietary issues discussed: none   Cardiac risk factors: advanced age (older than 43 for men, 34 for women), dyslipidemia and hypertension.  Depression Screen (Note: if answer to either of the following is "Yes", a more complete depression screening is indicated)   Over the past 2 weeks, have you felt down, depressed or hopeless? No  Over the past 2 weeks, have you felt little interest or pleasure in doing things? No  Have you lost interest or pleasure in daily  life? No  Do you often feel hopeless? No  Do you cry easily over simple problems? No  Activities of Daily Living In your present state of health, do you have any difficulty performing the following activities?:  Driving? No Managing money?  No Feeding yourself? No Getting from bed to chair? No  Climbing a flight of stairs? No Preparing food and eating?: No Bathing or showering? No Getting dressed: No Getting to the toilet? No Using the toilet:No Moving around from place to place: No In the past year have you fallen or had a near fall?:No   Are you sexually active?  No  Do you have more than one partner?  No  Hearing Difficulties: No Do you often ask people to speak up or repeat themselves? No Do you experience ringing or noises in your ears? No Do you have difficulty understanding soft or whispered voices? Yes   Do you feel that you have a problem with memory? Yes  Do you often misplace items? Yes  Do you feel safe at home?  Yes  Cognitive Testing  Alert? Yes  Normal Appearance?Yes  Oriented to person? Yes               Place? Yes   Time? Yes  Recall of three objects?  Yes  Can perform simple calculations? Yes  Displays appropriate judgment?Yes  Can read the correct time from a watch face?Yes   Advanced Directives have been discussed with the patient? Yes - her daughter [name] has copy and she does as well of AD and healthcare power of attorny  List the Names of Other Physician/Practitioners you currently use: 1.   [facility] - [place] and [place] 2.  GI - Laurence Spates 3.  Orthopedic surgeon for hip replacement - Joni Fears 4.  Surgeon for breast lumpectome - Matther Wakefield 5.  Ophthamologist - Dr. Iona Hansen with [facility] [phone]  Indicate any recent Medical Services you may have received from other than [facility] providers in the past year (date may be approximate).  Immunization History  Administered Date(s) Administered  . Influenza  Whole 04/27/2010  . Influenza,inj,Quad PF,36+ Mos Nov 23, 202014  . Pneumococcal Conjugate-13 07/28/1998  . Td 07/28/2000   Patient has refused Zostavax in past  Screening Tests Health Maintenance  Topic Date Due  . Zostavax  12/13/2013  . Influenza Vaccine  02/25/2014  . Tetanus/tdap  03/28/2021  . Colonoscopy  12/26/2021  . Pneumococcal Polysaccharide Vaccine Age 60  And Over  Completed  Last glaucoma screening / eye exam less than 1 years ago.  Requesting documentation from Dr Iona Hansen' office. patietn refused mammogram and DEXA in past  All answers were reviewed with the patient and necessary referrals were made:  Cherre Robins, [facility]   08/29/2013   History reviewed: allergies, current medications, past family history, past medical history, past social history, past surgical history and problem list    Objective:    Body mass index is 23.9 kg/(m^2). BP 140/78  Pulse 72  Ht 5\' 6"  (1.676 m)  Wt 148 lb (67.132 kg)  BMI 23.90 kg/m2   INR was 2.2 today   Assessment:     Annual Medicare Wellness Visit Therapeutic anticoagulation     Plan:     During the course of the visit the patient was educated and counseled about appropriate screening and preventive services including:    Pneumococcal vaccine   Influenza vaccine  Hepatitis B vaccine  Td vaccine  Screening mammography  Bone densitometry screening  Colorectal cancer screening  Glaucoma screening  Diet review for nutrition referral?  Not Indicated   Patient continues to refuse follow up mammograms, zostavax and BMD / DEXA - she understands risk of disease/worsening of conditions without these interventions.  Anticoagulation Dose Instructions as of 08/29/2013     Dorene Grebe Tue Wed Thu Fri Sat   New Dose 5 mg 2.5 mg 5 mg 2.5 mg 5 mg 2.5 mg 5 mg    Description       Continue 1/2 tablet mondays, wednesdays and fridays.  1 tablet all other days.    RTC in 1 month to recheck INR RX for warfarin sent to  [place]  Patient Instructions (the written plan) was given to the patient.  Medicare Attestation I have personally reviewed: The patient's medical and social history Their use of alcohol, tobacco or illicit drugs Their current medications and supplements The patient's functional ability including ADLs,fall risks, home safety risks, cognitive, and hearing and visual impairment Diet and physical activities Evidence for depression or mood disorders  The patient's weight, height, BMI, BP and Heart rate have been recorded in the chart.  I have made referrals, counseling, and provided education to the patient based on review of the above and I have provided the patient with a written personalized care plan for preventive services.     Cherre Robins, [facility]   08/29/2013

## 2013-08-29 NOTE — Patient Instructions (Signed)
Health Maintenance Summary    ZOSTAVAX Due now   Patient Declined    INFLUENZA VACCINE Next Due 02/25/2014  last done 05/15/2013   Mammogram Due now  Patient declined   DEXA - bone denisty Due now  Patient declined   Pneumonia vaccine completed      TETANUS/TDAP Next Due 03/28/2021  last done 03/29/2011    COLONOSCOPY Next Due 12/26/2021  last done 12/27/2011       Anticoagulation Dose Instructions as of 08/29/2013     Dorene Grebe Tue Wed Thu Fri Sat   New Dose 5 mg 2.5 mg 5 mg 2.5 mg 5 mg 2.5 mg 5 mg    Description       Continue 1/2 tablet mondays, wednesdays and fridays.  1 tablet all other days.     INR was 2.2 today     Preventive Care for Adults, Female A healthy lifestyle and preventive care can promote health and wellness. Preventive health guidelines for women include the following key practices.  A routine yearly physical is a good way to check with your health care provider about your health and preventive screening. It is a chance to share any concerns and updates on your health and to receive a thorough exam.  Visit your dentist for a routine exam and preventive care every 6 months. Brush your teeth twice a day and floss once a day. Good oral hygiene prevents tooth decay and gum disease.  The frequency of eye exams is based on your age, health, family medical history, use of contact lenses, and other factors. Follow your health care provider's recommendations for frequency of eye exams.  Eat a healthy diet. Foods like vegetables, fruits, whole grains, low-fat dairy products, and lean protein foods contain the nutrients you need without too many calories. Decrease your intake of foods high in solid fats, added sugars, and salt. Eat the right amount of calories for you.Get information about a proper diet from your health care provider, if necessary.  Regular physical exercise is one of the most important things you can do for your health. Most adults should get at least 150  minutes of moderate-intensity exercise (any activity that increases your heart rate and causes you to sweat) each week. In addition, most adults need muscle-strengthening exercises on 2 or more days a week.  Maintain a healthy weight. The body mass index (BMI) is a screening tool to identify possible weight problems. It provides an estimate of body fat based on height and weight. Your health care provider can find your BMI, and can help you achieve or maintain a healthy weight.For adults 20 years and older:  A BMI below 18.5 is considered underweight.  A BMI of 18.5 to 24.9 is normal.  A BMI of 25 to 29.9 is considered overweight.  A BMI of 30 and above is considered obese.  Maintain normal blood lipids and cholesterol levels by exercising and minimizing your intake of saturated fat. Eat a balanced diet with plenty of fruit and vegetables. Blood tests for lipids and cholesterol should begin at age 55 and be repeated every 5 years. If your lipid or cholesterol levels are high, you are over 50, or you are at high risk for heart disease, you may need your cholesterol levels checked more frequently.Ongoing high lipid and cholesterol levels should be treated with medicines if diet and exercise are not working.  If you smoke, find out from your health care provider how to quit. If you do not  use tobacco, do not start.  Lung cancer screening is recommended for adults aged 74 80 years who are at high risk for developing lung cancer because of a history of smoking. A yearly low-dose CT scan of the lungs is recommended for people who have at least a 30-pack-year history of smoking and are a current smoker or have quit within the past 15 years. A pack year of smoking is smoking an average of 1 pack of cigarettes a day for 1 year (for example: 1 pack a day for 30 years or 2 packs a day for 15 years). Yearly screening should continue until the smoker has stopped smoking for at least 15 years. Yearly screening  should be stopped for people who develop a health problem that would prevent them from having lung cancer treatment.  If you are pregnant, do not drink alcohol. If you are breastfeeding, be very cautious about drinking alcohol. If you are not pregnant and choose to drink alcohol, do not have more than 1 drink per day. One drink is considered to be 12 ounces (355 mL) of beer, 5 ounces (148 mL) of wine, or 1.5 ounces (44 mL) of liquor.  Avoid use of street drugs. Do not share needles with anyone. Ask for help if you need support or instructions about stopping the use of drugs.  High blood pressure causes heart disease and increases the risk of stroke. Your blood pressure should be checked at least every 1 to 2 years. Ongoing high blood pressure should be treated with medicines if weight loss and exercise do not work.  If you are 79 78 years old, ask your health care provider if you should take aspirin to prevent strokes.  Diabetes screening involves taking a blood sample to check your fasting blood sugar level. This should be done once every 3 years, after age 39, if you are within normal weight and without risk factors for diabetes. Testing should be considered at a younger age or be carried out more frequently if you are overweight and have at least 1 risk factor for diabetes.  Breast cancer screening is essential preventive care for women. You should practice "breast self-awareness." This means understanding the normal appearance and feel of your breasts and may include breast self-examination. Any changes detected, no matter how small, should be reported to a health care provider. Women in their 66s and 30s should have a clinical breast exam (CBE) by a health care provider as part of a regular health exam every 1 to 3 years. After age 32, women should have a CBE every year. Starting at age 37, women should consider having a mammogram (breast X-ray test) every year. Women who have a family history of  breast cancer should talk to their health care provider about genetic screening. Women at a high risk of breast cancer should talk to their health care providers about having an MRI and a mammogram every year.  Breast cancer gene (BRCA)-related cancer risk assessment is recommended for women who have family members with BRCA-related cancers. BRCA-related cancers include breast, ovarian, tubal, and peritoneal cancers. Having family members with these cancers may be associated with an increased risk for harmful changes (mutations) in the breast cancer genes BRCA1 and BRCA2. Results of the assessment will determine the need for genetic counseling and BRCA1 and BRCA2 testing.  The Pap test is a screening test for cervical cancer. A Pap test can show cell changes on the cervix that might become cervical cancer if left  untreated. A Pap test is a procedure in which cells are obtained and examined from the lower end of the uterus (cervix).  Women should have a Pap test starting at age 17.  Between ages 24 and 86, Pap tests should be repeated every 2 years.  Beginning at age 15, you should have a Pap test every 3 years as long as the past 3 Pap tests have been normal.  Some women have medical problems that increase the chance of getting cervical cancer. Talk to your health care provider about these problems. It is especially important to talk to your health care provider if a new problem develops soon after your last Pap test. In these cases, your health care provider may recommend more frequent screening and Pap tests.  The above recommendations are the same for women who have or have not gotten the vaccine for human papillomavirus (HPV).  If you had a hysterectomy for a problem that was not cancer or a condition that could lead to cancer, then you no longer need Pap tests. Even if you no longer need a Pap test, a regular exam is a good idea to make sure no other problems are starting.  If you are between  ages 40 and 20 years, and you have had normal Pap tests going back 10 years, you no longer need Pap tests. Even if you no longer need a Pap test, a regular exam is a good idea to make sure no other problems are starting.  If you have had past treatment for cervical cancer or a condition that could lead to cancer, you need Pap tests and screening for cancer for at least 20 years after your treatment.  If Pap tests have been discontinued, risk factors (such as a new sexual partner) need to be reassessed to determine if screening should be resumed.  The HPV test is an additional test that may be used for cervical cancer screening. The HPV test looks for the virus that can cause the cell changes on the cervix. The cells collected during the Pap test can be tested for HPV. The HPV test could be used to screen women aged 49 years and older, and should be used in women of any age who have unclear Pap test results. After the age of 30, women should have HPV testing at the same frequency as a Pap test.  Colorectal cancer can be detected and often prevented. Most routine colorectal cancer screening begins at the age of 70 years and continues through age 26 years. However, your health care provider may recommend screening at an earlier age if you have risk factors for colon cancer. On a yearly basis, your health care provider may provide home test kits to check for hidden blood in the stool. Use of a small camera at the end of a tube, to directly examine the colon (sigmoidoscopy or colonoscopy), can detect the earliest forms of colorectal cancer. Talk to your health care provider about this at age 20, when routine screening begins. Direct exam of the colon should be repeated every 5 10 years through age 35 years, unless early forms of pre-cancerous polyps or small growths are found.  People who are at an increased risk for hepatitis B should be screened for this virus. You are considered at high risk for hepatitis B  if:  You were born in a country where hepatitis B occurs often. Talk with your health care provider about which countries are considered high risk.  Your  parents were born in a high-risk country and you have not received a shot to protect against hepatitis B (hepatitis B vaccine).  You have HIV or AIDS.  You use needles to inject street drugs.  You live with, or have sex with, someone who has Hepatitis B.  You get hemodialysis treatment.  You take certain medicines for conditions like cancer, organ transplantation, and autoimmune conditions.  Hepatitis C blood testing is recommended for all people born from [birth year] through 1963 and any individual with known risks for hepatitis C.  Practice safe sex. Use condoms and avoid high-risk sexual practices to reduce the spread of sexually transmitted infections (STIs). STIs include gonorrhea, chlamydia, syphilis, trichomonas, herpes, HPV, and human immunodeficiency virus (HIV). Herpes, HIV, and HPV are viral illnesses that have no cure. They can result in disability, cancer, and death. Sexually active women aged 67 years and younger should be checked for chlamydia. Older women with new or multiple partners should also be tested for chlamydia. Testing for other STIs is recommended if you are sexually active and at increased risk.  Osteoporosis is a disease in which the bones lose minerals and strength with aging. This can result in serious bone fractures or breaks. The risk of osteoporosis can be identified using a bone density scan. Women ages 62 years and over and women at risk for fractures or osteoporosis should discuss screening with their health care providers. Ask your health care provider whether you should take a calcium supplement or vitamin D to reduce the rate of osteoporosis.  Menopause can be associated with physical symptoms and risks. Hormone replacement therapy is available to decrease symptoms and risks. You should talk to your health care  provider about whether hormone replacement therapy is right for you.  Use sunscreen. Apply sunscreen liberally and repeatedly throughout the day. You should seek shade when your shadow is shorter than you. Protect yourself by wearing long sleeves, pants, a wide-brimmed hat, and sunglasses year round, whenever you are outdoors.  Once a month, do a whole body skin exam, using a mirror to look at the skin on your back. Tell your health care provider of new moles, moles that have irregular borders, moles that are larger than a pencil eraser, or moles that have changed in shape or color.  Stay current with required vaccines (immunizations).  Influenza vaccine. All adults should be immunized every year.  Tetanus, diphtheria, and acellular pertussis (Td, Tdap) vaccine. Pregnant women should receive 1 dose of Tdap vaccine during each pregnancy. The dose should be obtained regardless of the length of time since the last dose. Immunization is preferred during the 27th 36th week of gestation. An adult who has not previously received Tdap or who does not know her vaccine status should receive 1 dose of Tdap. This initial dose should be followed by tetanus and diphtheria toxoids (Td) booster doses every 10 years. Adults with an unknown or incomplete history of completing a 3-dose immunization series with Td-containing vaccines should begin or complete a primary immunization series including a Tdap dose. Adults should receive a Td booster every 10 years.  Varicella vaccine. An adult without evidence of immunity to varicella should receive 2 doses or a second dose if she has previously received 1 dose. Pregnant females who do not have evidence of immunity should receive the first dose after pregnancy. This first dose should be obtained before leaving the health care facility. The second dose should be obtained 4 8 weeks after the first dose.  Human papillomavirus (HPV) vaccine. Females aged 75 26 years who have not  received the vaccine previously should obtain the 3-dose series. The vaccine is not recommended for use in pregnant females. However, pregnancy testing is not needed before receiving a dose. If a female is found to be pregnant after receiving a dose, no treatment is needed. In that case, the remaining doses should be delayed until after the pregnancy. Immunization is recommended for any person with an immunocompromised condition through the age of 1 years if she did not get any or all doses earlier. During the 3-dose series, the second dose should be obtained 4 8 weeks after the first dose. The third dose should be obtained 24 weeks after the first dose and 16 weeks after the second dose.  Zoster vaccine. One dose is recommended for adults aged 7 years or older unless certain conditions are present.  Measles, mumps, and rubella (MMR) vaccine. Adults born before [birth year] generally are considered immune to measles and mumps. Adults born in [birth year] or later should have 1 or more doses of MMR vaccine unless there is a contraindication to the vaccine or there is laboratory evidence of immunity to each of the three diseases. A routine second dose of MMR vaccine should be obtained at least 28 days after the first dose for students attending postsecondary schools, health care workers, or international travelers. People who received inactivated measles vaccine or an unknown type of measles vaccine during 1963 1967 should receive 2 doses of MMR vaccine. People who received inactivated mumps vaccine or an unknown type of mumps vaccine before 1979 and are at high risk for mumps infection should consider immunization with 2 doses of MMR vaccine. For females of childbearing age, rubella immunity should be determined. If there is no evidence of immunity, females who are not pregnant should be vaccinated. If there is no evidence of immunity, females who are pregnant should delay immunization until after pregnancy. Unvaccinated  health care workers born before [birth year] who lack laboratory evidence of measles, mumps, or rubella immunity or laboratory confirmation of disease should consider measles and mumps immunization with 2 doses of MMR vaccine or rubella immunization with 1 dose of MMR vaccine.  Pneumococcal 13-valent conjugate (PCV13) vaccine. When indicated, a person who is uncertain of her immunization history and has no record of immunization should receive the PCV13 vaccine. An adult aged 30 years or older who has certain medical conditions and has not been previously immunized should receive 1 dose of PCV13 vaccine. This PCV13 should be followed with a dose of pneumococcal polysaccharide (PPSV23) vaccine. The PPSV23 vaccine dose should be obtained at least 8 weeks after the dose of PCV13 vaccine. An adult aged 60 years or older who has certain medical conditions and previously received 1 or more doses of PPSV23 vaccine should receive 1 dose of PCV13. The PCV13 vaccine dose should be obtained 1 or more years after the last PPSV23 vaccine dose.  Pneumococcal polysaccharide (PPSV23) vaccine. When PCV13 is also indicated, PCV13 should be obtained first. All adults aged 10 years and older should be immunized. An adult younger than age 36 years who has certain medical conditions should be immunized. Any person who resides in a nursing home or long-term care facility should be immunized. An adult smoker should be immunized. People with an immunocompromised condition and certain other conditions should receive both PCV13 and PPSV23 vaccines. People with human immunodeficiency virus (HIV) infection should be immunized as soon as possible after diagnosis. Immunization during  chemotherapy or radiation therapy should be avoided. Routine use of PPSV23 vaccine is not recommended for American Indians, [place] Natives, or people younger than 65 years unless there are medical conditions that require PPSV23 vaccine. When indicated, people who have  unknown immunization and have no record of immunization should receive PPSV23 vaccine. One-time revaccination 5 years after the first dose of PPSV23 is recommended for people aged 78 64 years who have chronic kidney failure, nephrotic syndrome, asplenia, or immunocompromised conditions. People who received 1 2 doses of PPSV23 before age 73 years should receive another dose of PPSV23 vaccine at age 40 years or later if at least 5 years have passed since the previous dose. Doses of PPSV23 are not needed for people immunized with PPSV23 at or after age 7 years.

## 2013-09-06 ENCOUNTER — Encounter: Payer: Self-pay | Admitting: *Deleted

## 2013-09-15 ENCOUNTER — Encounter: Payer: Self-pay | Admitting: Internal Medicine

## 2013-09-15 ENCOUNTER — Telehealth: Payer: Self-pay | Admitting: Cardiology

## 2013-09-15 NOTE — Telephone Encounter (Signed)
New Message  Power of attorney--Saundra Gunn Called-- Pt has an appt on 10/12/2013 and she states that the pt has pituitary adenomas. Ms. Sandi Mariscal is requesting that the Dr. Does not mention this during the visit. She states she does not want the PT to know. Please call back if needed to discuss.

## 2013-09-15 NOTE — Telephone Encounter (Signed)
Noted and Dr Percival Spanish aware.

## 2013-09-26 DIAGNOSIS — L570 Actinic keratosis: Secondary | ICD-10-CM | POA: Diagnosis not present

## 2013-09-26 DIAGNOSIS — D485 Neoplasm of uncertain behavior of skin: Secondary | ICD-10-CM | POA: Diagnosis not present

## 2013-09-26 DIAGNOSIS — Z85828 Personal history of other malignant neoplasm of skin: Secondary | ICD-10-CM | POA: Diagnosis not present

## 2013-09-26 DIAGNOSIS — L820 Inflamed seborrheic keratosis: Secondary | ICD-10-CM | POA: Diagnosis not present

## 2013-09-29 ENCOUNTER — Ambulatory Visit (INDEPENDENT_AMBULATORY_CARE_PROVIDER_SITE_OTHER): Payer: Medicare Other | Admitting: Pharmacist

## 2013-09-29 DIAGNOSIS — I4891 Unspecified atrial fibrillation: Secondary | ICD-10-CM | POA: Diagnosis not present

## 2013-09-29 DIAGNOSIS — Z7901 Long term (current) use of anticoagulants: Secondary | ICD-10-CM | POA: Diagnosis not present

## 2013-09-29 LAB — POCT INR: INR: 2

## 2013-09-29 NOTE — Patient Instructions (Signed)
Anticoagulation Dose Instructions as of 09/29/2013     [name] Tue Wed Thu Fri Sat   New Dose 5 mg 2.5 mg 5 mg 2.5 mg 5 mg 2.5 mg 5 mg    Description       Continue 1/2 tablet mondays, wednesdays and fridays.  1 tablet all other days.      INR was 2.0 today

## 2013-10-12 ENCOUNTER — Encounter: Payer: Self-pay | Admitting: Cardiology

## 2013-10-12 ENCOUNTER — Ambulatory Visit (INDEPENDENT_AMBULATORY_CARE_PROVIDER_SITE_OTHER): Payer: Medicare Other | Admitting: Cardiology

## 2013-10-12 VITALS — BP 119/64 | HR 75 | Ht 66.0 in | Wt 148.0 lb

## 2013-10-12 DIAGNOSIS — I4891 Unspecified atrial fibrillation: Secondary | ICD-10-CM

## 2013-10-12 DIAGNOSIS — I10 Essential (primary) hypertension: Secondary | ICD-10-CM

## 2013-10-12 NOTE — Patient Instructions (Signed)
The current medical regimen is effective;  continue present plan and medications.  Follow up in 1 year with Dr Hochrein.  You will receive a letter in the mail 2 months before you are due.  Please call us when you receive this letter to schedule your follow up appointment.  

## 2013-10-12 NOTE — Progress Notes (Signed)
HPI The patient presents for one-year followup. Since I last saw her she has had no new cardiovascular complaints. She denies any chest pressure, neck or arm discomfort. She's had no new shortness of breath, PND or orthopnea. She notices no palpitations, presyncope or syncope.  She still does her cooking and light housekeeping.  Her daughter lives in an apartment attached to her house.    Allergies  Allergen Reactions  . Atorvastatin     unknown  . Boniva [Ibandronate Sodium]     unknown  . Chocolate     unknown  . Lovastatin     unknown  . Pravastatin Sodium     unknown  . Risedronate Sodium     unknown  . Toprol Xl [Metoprolol Succinate]     unknown  . Visken [Pindolol]     unknown  . Welchol [Colesevelam Hcl]     unknown    Current Outpatient Prescriptions  Medication Sig Dispense Refill  . b complex vitamins tablet Take 1 tablet by mouth daily.       . Cholecalciferol (VITAMIN D3) 1000 UNITS tablet Take 2,000 Units by mouth daily.        . Fe Fum-FePoly-Vit C-Vit B3 (INTEGRA) 62.5-62.5-40-3 MG CAPS TAKE ONE CAPSULE BY MOUTH ONE TIME DAILY  30 each  3  . fish oil-omega-3 fatty acids 1000 MG capsule Take 2 g by mouth daily.      . furosemide (LASIX) 20 MG tablet Take 20 mg by mouth daily. .      . losartan (COZAAR) 50 MG tablet Take 25 mg by mouth daily.       . polyethylene glycol (MIRALAX / GLYCOLAX) packet Take 17 g by mouth 2 (two) times daily.      . vitamin C (ASCORBIC ACID) 500 MG tablet Take 500 mg by mouth daily.        . vitamin E 400 UNIT capsule Take 400 Units by mouth daily.      Marland Kitchen warfarin (COUMADIN) 5 MG tablet Take 1/2 or 1 tablet daily as directed by coumadin / anticoagulation clinic  90 tablet  1   No current facility-administered medications for this visit.    Past Medical History  Diagnosis Date  . Atrial fibrillation   . Hypertension   . Heart failure, diastolic   . Carotid bruit   . S/P AV nodal ablation   . Ovarian cyst   . Colon disorder      Adhesions  . QT prolongation     With solatol  . Nephrolithiasis   . Mitral regurgitation     Moderate  . Hyperlipidemia     statin intolerant  . Restless leg syndrome     Past Surgical History  Procedure Laterality Date  . Pacemaker insertion      [employer]  . Cystectomy      Ovarian  . Abdominal adhesion surgery      twice  . Cataract extraction      Bilaterally  . Colon surgery      Colonic obstruction secondary to diverticulitis with resection  . Joint replacement Right 2004    hip  . Breast surgery Left 2010    lumpectomy    ROS:  As stated in the HPI and negative for all other systems.  PHYSICAL EXAM BP 119/64  Pulse 75  Ht 5\' 6"  (1.676 m)  Wt 148 lb (67.132 kg)  BMI 23.90 kg/m2 GENERAL:  Well appearing for her age 80:  Pupils  equal round and reactive, fundi not visualized, oral mucosa unremarkable NECK:  No jugular venous distention, waveform within normal limits, carotid upstroke brisk and symmetric, no bruits, transmitted systolic murmur on the right, no thyromegaly LUNGS:  Clear to auscultation bilaterally BACK:  No CVA tenderness CHEST:  Well healed pacer pocket. HEART:  PMI not displaced or sustained,S1 and S2 within normal limits, no S3, clicks, no rubs, apical holosystolic murmur to the axilla, apical systolic murmur mid to late peaking and radiating out the aortic outflow tract with parvus et tardus. This ABD:  Flat, positive bowel sounds normal in frequency in pitch, no bruits, no rebound, no guarding, no midline pulsatile mass, no hepatomegaly, no splenomegaly EXT:  2 plus pulses throughout, no edema, no cyanosis no clubbing   EKG:  Atrial fibrillation, ventricular pacing 100% capture.  10/12/2013   ASSESSMENT AND PLAN  MITRAL REGURGITATION -  At this point I will follow this clinically.  It was mild at the last echo one year ago.   AORTIC STENOSIS - By exam this seems to be progressive but she's not had any symptoms. He was moderate  previously. At this point I will follow this clinically and manage it symptomatically with further imaging reserved for any symptoms in the future.  HYPERTENSION, BENIGN -  The blood pressure is at target. No change in medications is indicated. We will continue with therapeutic lifestyle changes (TLC).   ATRIAL FIBRILLATION -  She tolerates anticoagulation. No change in therapy is indicated.  She does well with the warfarin and therefore I will not consider medication changes.

## 2013-11-03 ENCOUNTER — Ambulatory Visit (INDEPENDENT_AMBULATORY_CARE_PROVIDER_SITE_OTHER): Payer: Medicare Other | Admitting: Pharmacist

## 2013-11-03 DIAGNOSIS — I4891 Unspecified atrial fibrillation: Secondary | ICD-10-CM | POA: Diagnosis not present

## 2013-11-03 DIAGNOSIS — Z7901 Long term (current) use of anticoagulants: Secondary | ICD-10-CM

## 2013-11-03 LAB — POCT INR: INR: 2.4

## 2013-11-03 MED ORDER — LOSARTAN POTASSIUM 50 MG PO TABS: 25.0000 mg | ORAL_TABLET | Freq: Every day | ORAL | Status: DC

## 2013-11-03 NOTE — Patient Instructions (Signed)
Anticoagulation Dose Instructions as of 11/03/2013     [name] Tue Wed Thu Fri Sat   New Dose 5 mg 2.5 mg 5 mg 2.5 mg 5 mg 2.5 mg 5 mg    Description       Continue 1/2 tablet mondays, wednesdays and fridays.  1 tablet all other days.      INR was 2.4 today

## 2013-11-21 ENCOUNTER — Encounter: Payer: Medicare Other | Admitting: *Deleted

## 2013-12-07 ENCOUNTER — Encounter: Payer: Self-pay | Admitting: *Deleted

## 2013-12-08 ENCOUNTER — Ambulatory Visit (INDEPENDENT_AMBULATORY_CARE_PROVIDER_SITE_OTHER): Payer: Medicare Other | Admitting: Family Medicine

## 2013-12-08 ENCOUNTER — Encounter: Payer: Self-pay | Admitting: Family Medicine

## 2013-12-08 VITALS — BP 150/85 | HR 75 | Temp 97.6°F | Ht 66.0 in | Wt 146.0 lb

## 2013-12-08 DIAGNOSIS — E559 Vitamin D deficiency, unspecified: Secondary | ICD-10-CM | POA: Diagnosis not present

## 2013-12-08 DIAGNOSIS — I4891 Unspecified atrial fibrillation: Secondary | ICD-10-CM

## 2013-12-08 DIAGNOSIS — Z23 Encounter for immunization: Secondary | ICD-10-CM

## 2013-12-08 DIAGNOSIS — Z7901 Long term (current) use of anticoagulants: Secondary | ICD-10-CM

## 2013-12-08 DIAGNOSIS — Z853 Personal history of malignant neoplasm of breast: Secondary | ICD-10-CM

## 2013-12-08 DIAGNOSIS — K439 Ventral hernia without obstruction or gangrene: Secondary | ICD-10-CM

## 2013-12-08 DIAGNOSIS — I10 Essential (primary) hypertension: Secondary | ICD-10-CM | POA: Diagnosis not present

## 2013-12-08 DIAGNOSIS — C50919 Malignant neoplasm of unspecified site of unspecified female breast: Secondary | ICD-10-CM | POA: Insufficient documentation

## 2013-12-08 LAB — POCT CBC
GRANULOCYTE PERCENT: 57 % (ref 37–80)
HEMATOCRIT: 33.5 % — AB (ref 37.7–47.9)
HEMOGLOBIN: 10.9 g/dL — AB (ref 12.2–16.2)
Lymph, poc: 1.7 (ref 0.6–3.4)
MCH, POC: 29 pg (ref 27–31.2)
MCHC: 32.6 g/dL (ref 31.8–35.4)
MCV: 89 fL (ref 80–97)
MPV: 7.3 fL (ref 0–99.8)
POC GRANULOCYTE: 2.8 (ref 2–6.9)
POC LYMPH PERCENT: 33.1 %L (ref 10–50)
Platelet Count, POC: 190 10*3/uL (ref 142–424)
RBC: 3.8 M/uL — AB (ref 4.04–5.48)
RDW, POC: 13.9 %
WBC: 5 10*3/uL (ref 4.6–10.2)

## 2013-12-08 LAB — POCT INR: INR: 2.1

## 2013-12-08 NOTE — Patient Instructions (Addendum)
Anticoagulation Dose Instructions as of 12/08/2013     Dorene Grebe Tue Wed Thu Fri Sat   New Dose 5 mg 2.5 mg 5 mg 2.5 mg 5 mg 2.5 mg 5 mg    Description       Continue warfarin 5mg  tablets - take 1/2 tablet mondays, wednesdays and fridays and 1 tablet all other days.      INR was 2.1 today                          Medicare Annual Wellness Visit  [facility] and the medical providers at [facility] strive to bring you the best medical care.  In doing so we not only want to address your current medical conditions and concerns but also to detect new conditions early and prevent illness, disease and health-related problems.    Medicare offers a yearly Wellness Visit which allows our clinical staff to assess your need for preventative services including immunizations, lifestyle education, counseling to decrease risk of preventable diseases and screening for fall risk and other medical concerns.    This visit is provided free of charge (no copay) for all Medicare recipients. The clinical pharmacists at [facility] have begun to conduct these Wellness Visits which will also include a thorough review of all your medications.    As you primary medical provider recommend that you make an appointment for your Annual Wellness Visit if you have not done so already this year.  You may set up this appointment before you leave today or you may call back [phone]) and schedule an appointment.  Please make sure when you call that you mention that you are scheduling your Annual Wellness Visit with the clinical pharmacist so that the appointment may be made for the proper length of time.       Continue current medications. Continue good therapeutic lifestyle changes which include good diet and exercise. Fall precautions discussed with patient. If an FOBT was given today- please return it to our front desk. If you are over 80 years old - you may need Prevnar 62  or the adult Pneumonia vaccine.  The Prevnar vaccine he received today may make her on a sore Continue to be careful and did not put herself at risk for falling We will call him with the results of the lab work once that lab work result is available.  Return the FOBT

## 2013-12-08 NOTE — Progress Notes (Signed)
Subjective:    Patient ID: [name], female    DOB: [birth year], 80 y.o.   MRN: [id number]  HPI Pt here for follow up and management of chronic medical problems. The patient is doing well. She has no particular complaints. She has a long history of atrial fibrillation. She is on Coumadin for this. She will get lab work today. She will be given an FOBT to return. And she is due to get a Prevnar. The patient is 80 years old she is very pleasant and cooperative        Patient Active Problem List   Diagnosis Date Noted  . Long term (current) use of anticoagulants 05/05/2013  . Shortness of breath 05/02/2013  . Restless leg syndrome 10/12/2010  . CAROTID BRUIT 08/21/2010  . MITRAL REGURGITATION 08/01/2009  . HYPERTENSION, BENIGN 08/17/2008  . AV BLOCK, COMPLETE 08/17/2008  . ATRIAL FIBRILLATION 08/17/2008  . PACEMAKER, PERMANENT 08/17/2008   Outpatient Encounter Prescriptions as of 12/08/2013  Medication Sig  . b complex vitamins tablet Take 1 tablet by mouth daily.   . Cholecalciferol (VITAMIN D3) 1000 UNITS tablet Take 2,000 Units by mouth daily.    . Fe Fum-FePoly-Vit C-Vit B3 (INTEGRA) 62.5-62.5-40-3 MG CAPS TAKE ONE CAPSULE BY MOUTH ONE TIME DAILY  . fish oil-omega-3 fatty acids 1000 MG capsule Take 2 g by mouth daily.  . furosemide (LASIX) 20 MG tablet Take 20 mg by mouth daily. .  . losartan (COZAAR) 50 MG tablet Take 0.5 tablets (25 mg total) by mouth daily.  . polyethylene glycol (MIRALAX / GLYCOLAX) packet Take 17 g by mouth 2 (two) times daily.  . vitamin C (ASCORBIC ACID) 500 MG tablet Take 500 mg by mouth daily.    . vitamin E 400 UNIT capsule Take 400 Units by mouth daily.  Marland Kitchen warfarin (COUMADIN) 5 MG tablet Take 1/2 or 1 tablet daily as directed by coumadin / anticoagulation clinic    Review of Systems  Constitutional: Negative.   HENT: Negative.   Eyes: Negative.   Respiratory: Negative.   Cardiovascular: Negative.   Gastrointestinal: Negative.     Endocrine: Negative.   Genitourinary: Negative.   Musculoskeletal: Negative.   Skin: Negative.   Allergic/Immunologic: Negative.   Neurological: Negative.   Hematological: Negative.   Psychiatric/Behavioral: Negative.        Objective:   Physical Exam  Nursing note and vitals reviewed. Constitutional: She is oriented to person, place, and time. She appears well-developed and well-nourished. No distress.  The patient is calm and looks incredible for her age of 80. She is alert and cooperative  HENT:  Head: Normocephalic and atraumatic.  Right Ear: External ear normal.  Left Ear: External ear normal.  Nose: Nose normal.  Mouth/Throat: Oropharynx is clear and moist.  Eyes: Conjunctivae and EOM are normal. Pupils are equal, round, and reactive to light. Right eye exhibits no discharge. Left eye exhibits no discharge. No scleral icterus.  Neck: Normal range of motion. Neck supple. No JVD present. No thyromegaly present.  Cardiovascular: Normal rate and regular rhythm.  Exam reveals no gallop and no friction rub.   Murmur heard. The rhythm is regular today with a grade 3/6 systolic ejection murmur.  Pulmonary/Chest: Effort normal and breath sounds normal. No respiratory distress. She has no wheezes. She has no rales. She exhibits no tenderness.  Abdominal: Soft. Bowel sounds are normal. She exhibits no mass. There is tenderness. There is no rebound and no guarding.  There is slight midline abdominal  tenderness near her ventral hernia   Musculoskeletal: Normal range of motion. She exhibits no edema and no tenderness.  She has good strength in her lower extremities with flexion extension and hip raising.  Lymphadenopathy:    She has no cervical adenopathy.  Neurological: She is alert and oriented to person, place, and time.  Skin: Skin is warm and dry. No rash noted.  Psychiatric: She has a normal mood and affect. Her behavior is normal. Judgment and thought content normal.   BP  150/85  Pulse 75  Temp(Src) 97.6 F (36.4 C) (Oral)  Ht 5' 6"  (1.676 m)  Wt 146 lb (66.225 kg)  BMI 23.58 kg/m2        Assessment & Plan:  1. Long term (current) use of anticoagulants - POCT INR - POCT CBC  2. Atrial fibrillation - POCT INR - POCT CBC - BMP8+EGFR - Hepatic function panel - Lipid panel  3. HYPERTENSION, BENIGN - POCT CBC - BMP8+EGFR - Hepatic function panel - Lipid panel  4. Vitamin D deficiency - Vit D  25 hydroxy (rtn osteoporosis monitoring)  5. Ventral hernia  6. History of breast cancer Patient Instructions   Anticoagulation Dose Instructions as of 12/08/2013     Dorene Grebe Tue Wed Thu Fri Sat   New Dose 5 mg 2.5 mg 5 mg 2.5 mg 5 mg 2.5 mg 5 mg    Description       Continue warfarin 69m tablets - take 1/2 tablet mondays, wednesdays and fridays and 1 tablet all other days.      INR was 2.1 today                          Medicare Annual Wellness Visit  [facility] care.  In doing so we not only want to address your current medical conditions and concerns but also to detect new conditions early and prevent illness, disease and health-related problems.    Medicare offers a yearly Wellness Visit which allows our clinical staff to assess your need for preventative services including immunizations, lifestyle education, counseling to decrease risk of preventable diseases and screening for fall risk and other medical concerns.    This visit is provided free of charge (no copay) for all Medicare recipients. [facility] which will also include a thorough review of all your medications.    As you primary medical provider recommend that you make an appointment for your Annual Wellness Visit if you have not done so already this year.  You may set up this  appointment before you leave today or you may call back [phone] and schedule an appointment.  Please make sure when you call that you mention that you are scheduling your Annual Wellness Visit with the clinical pharmacist so that the appointment may be made for the proper length of time.       Continue current medications. Continue good therapeutic lifestyle changes which include good diet and exercise. Fall precautions discussed with patient. If an FOBT was given today- please return it to our front desk. If you are over 567years old - you may need Prevnar 139or the adult Pneumonia vaccine.  The Prevnar vaccine he received today may make her on a sore Continue to be careful and did not put herself at  risk for falling We will call him with the results of the lab work once that lab work result is available.  Return the FOBT   Arrie Senate MD

## 2013-12-08 NOTE — Addendum Note (Signed)
Addended by: Ilean China on: 12/08/2013 01:11 PM   Modules accepted: Orders

## 2013-12-09 LAB — HEPATIC FUNCTION PANEL
ALBUMIN: 4.7 g/dL — AB (ref 3.2–4.6)
ALT: 21 IU/L (ref 0–32)
AST: 36 IU/L (ref 0–40)
Alkaline Phosphatase: 52 IU/L (ref 39–117)
Bilirubin, Direct: 0.21 mg/dL (ref 0.00–0.40)
Total Bilirubin: 0.9 mg/dL (ref 0.0–1.2)
Total Protein: 7.3 g/dL (ref 6.0–8.5)

## 2013-12-09 LAB — LIPID PANEL
CHOL/HDL RATIO: 4.4 ratio (ref 0.0–4.4)
Cholesterol, Total: 221 mg/dL — ABNORMAL HIGH (ref 100–199)
HDL: 50 mg/dL (ref >39)
LDL Calculated: 157 mg/dL — ABNORMAL HIGH (ref 0–99)
TRIGLYCERIDES: 70 mg/dL (ref 0–149)
VLDL Cholesterol Cal: 14 mg/dL (ref 5–40)

## 2013-12-09 LAB — BMP8+EGFR
BUN/Creatinine Ratio: 20 (ref 11–26)
BUN: 20 mg/dL (ref 10–36)
CO2: 27 mmol/L (ref 18–29)
CREATININE: 0.98 mg/dL (ref 0.57–1.00)
Calcium: 9.7 mg/dL (ref 8.7–10.3)
Chloride: 98 mmol/L (ref 97–108)
GFR calc non Af Amer: 50 mL/min/{1.73_m2} — ABNORMAL LOW (ref >59)
GFR, EST AFRICAN AMERICAN: 57 mL/min/{1.73_m2} — AB (ref >59)
GLUCOSE: 113 mg/dL — AB (ref 65–99)
Potassium: 4.2 mmol/L (ref 3.5–5.2)
Sodium: 141 mmol/L (ref 134–144)

## 2013-12-09 LAB — VITAMIN D 25 HYDROXY (VIT D DEFICIENCY, FRACTURES): Vit D, 25-Hydroxy: 32.3 ng/mL (ref 30.0–100.0)

## 2013-12-15 ENCOUNTER — Ambulatory Visit (INDEPENDENT_AMBULATORY_CARE_PROVIDER_SITE_OTHER): Payer: Medicare Other | Admitting: *Deleted

## 2013-12-15 DIAGNOSIS — I4891 Unspecified atrial fibrillation: Secondary | ICD-10-CM | POA: Diagnosis not present

## 2013-12-15 NOTE — Progress Notes (Signed)
Remote pacemaker transmission.   

## 2013-12-22 ENCOUNTER — Other Ambulatory Visit (INDEPENDENT_AMBULATORY_CARE_PROVIDER_SITE_OTHER): Payer: Medicare Other

## 2013-12-22 DIAGNOSIS — R7989 Other specified abnormal findings of blood chemistry: Secondary | ICD-10-CM | POA: Diagnosis not present

## 2013-12-22 LAB — POCT CBC
GRANULOCYTE PERCENT: 52.2 % (ref 37–80)
HCT, POC: 34.1 % — AB (ref 37.7–47.9)
HEMOGLOBIN: 11.1 g/dL — AB (ref 12.2–16.2)
LYMPH, POC: 2.2 (ref 0.6–3.4)
MCH: 28.8 pg (ref 27–31.2)
MCHC: 32.5 g/dL (ref 31.8–35.4)
MCV: 88.7 fL (ref 80–97)
MPV: 8.2 fL (ref 0–99.8)
POC GRANULOCYTE: 2.9 (ref 2–6.9)
POC LYMPH PERCENT: 40.3 %L (ref 10–50)
Platelet Count, POC: 212 10*3/uL (ref 142–424)
RBC: 3.9 M/uL — AB (ref 4.04–5.48)
RDW, POC: 13.6 %
WBC: 5.5 10*3/uL (ref 4.6–10.2)

## 2013-12-22 NOTE — Progress Notes (Signed)
Patient came in for labs only.

## 2013-12-23 ENCOUNTER — Telehealth: Payer: Self-pay | Admitting: Family Medicine

## 2013-12-23 NOTE — Telephone Encounter (Signed)
Message copied by Waverly Ferrari on Fri Dec 23, 2013  8:53 AM ------      Message from: Chipper Herb      Created: Fri Dec 23, 2013  7:48 AM       Please call her daughter [name] with these results ------

## 2013-12-25 LAB — MDC_IDC_ENUM_SESS_TYPE_REMOTE
Battery Remaining Longevity: 134 mo
Battery Remaining Percentage: 90 %
Brady Statistic RV Percent Paced: 99 %
Implantable Pulse Generator Serial Number: 7331332
Lead Channel Pacing Threshold Amplitude: 0.625 V
Lead Channel Setting Pacing Amplitude: 0.875
Lead Channel Setting Pacing Pulse Width: 0.4 ms
Lead Channel Setting Sensing Sensitivity: 4 mV
MDC IDC MSMT BATTERY VOLTAGE: 2.98 V
MDC IDC MSMT LEADCHNL RV IMPEDANCE VALUE: 410 Ohm
MDC IDC MSMT LEADCHNL RV PACING THRESHOLD PULSEWIDTH: 0.4 ms
MDC IDC MSMT LEADCHNL RV SENSING INTR AMPL: 12 mV
MDC IDC SESS DTM: 20150521123140

## 2013-12-30 ENCOUNTER — Encounter: Payer: Self-pay | Admitting: Cardiology

## 2013-12-30 ENCOUNTER — Other Ambulatory Visit: Payer: Self-pay | Admitting: Family Medicine

## 2014-01-05 ENCOUNTER — Encounter: Payer: Self-pay | Admitting: Internal Medicine

## 2014-01-09 ENCOUNTER — Other Ambulatory Visit: Payer: Medicare Other

## 2014-01-09 DIAGNOSIS — Z1212 Encounter for screening for malignant neoplasm of rectum: Secondary | ICD-10-CM | POA: Diagnosis not present

## 2014-01-09 NOTE — Progress Notes (Signed)
Pt came in for lab  only 

## 2014-01-11 LAB — FECAL OCCULT BLOOD, IMMUNOCHEMICAL: FECAL OCCULT BLD: NEGATIVE

## 2014-01-12 ENCOUNTER — Ambulatory Visit (INDEPENDENT_AMBULATORY_CARE_PROVIDER_SITE_OTHER): Payer: Medicare Other | Admitting: Pharmacist

## 2014-01-12 DIAGNOSIS — I4891 Unspecified atrial fibrillation: Secondary | ICD-10-CM

## 2014-01-12 DIAGNOSIS — Z7901 Long term (current) use of anticoagulants: Secondary | ICD-10-CM

## 2014-01-12 LAB — POCT INR: INR: 2.1

## 2014-01-12 MED ORDER — TROSPIUM CHLORIDE ER 60 MG PO CP24: 1.0000 | ORAL_CAPSULE | Freq: Every day | ORAL | Status: DC

## 2014-01-12 NOTE — Patient Instructions (Signed)
Anticoagulation Dose Instructions as of 01/12/2014     [name] Tue Wed Thu Fri Sat   New Dose 5 mg 2.5 mg 5 mg 2.5 mg 5 mg 2.5 mg 5 mg    Description       Continue warfarin 5mg  tablets - take 1/2 tablet mondays, wednesdays and fridays and 1 tablet all other days.      INR was 2.1 today

## 2014-01-26 ENCOUNTER — Telehealth: Payer: Self-pay | Admitting: Pharmacist

## 2014-01-26 NOTE — Telephone Encounter (Signed)
Has prescribed trospium 60mg  Er for overactive bladder 2 weeks ago. Received communication from [facility] that patient requested cheaper alternative because was $71.24.   I called patient to discuss and she said she has decided not to take anything for her bladder due to the increase risk of constipation and history of colon adhesions.  trospium discontinued from medication list.

## 2014-02-16 ENCOUNTER — Ambulatory Visit (INDEPENDENT_AMBULATORY_CARE_PROVIDER_SITE_OTHER): Payer: Medicare Other | Admitting: Pharmacist

## 2014-02-16 DIAGNOSIS — Z7901 Long term (current) use of anticoagulants: Secondary | ICD-10-CM

## 2014-02-16 DIAGNOSIS — I4891 Unspecified atrial fibrillation: Secondary | ICD-10-CM

## 2014-02-16 LAB — POCT INR: INR: 2.1

## 2014-02-16 NOTE — Patient Instructions (Signed)
Anticoagulation Dose Instructions as of 02/16/2014     Dorene Grebe Tue Wed Thu Fri Sat   New Dose 5 mg 2.5 mg 5 mg 2.5 mg 5 mg 2.5 mg 5 mg    Description       Continue warfarin 5mg  tablets - take 1/2 tablet mondays, wednesdays and fridays and 1 tablet all other days.      INR was 2.1 today

## 2014-03-11 ENCOUNTER — Other Ambulatory Visit: Payer: Self-pay | Admitting: Family Medicine

## 2014-03-20 ENCOUNTER — Ambulatory Visit (INDEPENDENT_AMBULATORY_CARE_PROVIDER_SITE_OTHER): Payer: Medicare Other | Admitting: *Deleted

## 2014-03-20 ENCOUNTER — Telehealth: Payer: Self-pay | Admitting: Cardiology

## 2014-03-20 DIAGNOSIS — I442 Atrioventricular block, complete: Secondary | ICD-10-CM

## 2014-03-20 DIAGNOSIS — I4891 Unspecified atrial fibrillation: Secondary | ICD-10-CM | POA: Diagnosis not present

## 2014-03-20 NOTE — Progress Notes (Signed)
Remote pacemaker transmission.   

## 2014-03-20 NOTE — Telephone Encounter (Signed)
Spoke with pt and reminded pt of remote transmission that is due today. Pt verbalized understanding.   

## 2014-03-22 LAB — MDC_IDC_ENUM_SESS_TYPE_REMOTE
Battery Remaining Longevity: 133 mo
Battery Remaining Percentage: 89 %
Brady Statistic RV Percent Paced: 99 %
Implantable Pulse Generator Serial Number: 7331332
Lead Channel Sensing Intrinsic Amplitude: 12 mV
Lead Channel Setting Pacing Amplitude: 0.875
Lead Channel Setting Pacing Pulse Width: 0.4 ms
Lead Channel Setting Sensing Sensitivity: 4 mV
MDC IDC MSMT BATTERY VOLTAGE: 2.98 V
MDC IDC MSMT LEADCHNL RV IMPEDANCE VALUE: 440 Ohm
MDC IDC MSMT LEADCHNL RV PACING THRESHOLD AMPLITUDE: 0.625 V
MDC IDC MSMT LEADCHNL RV PACING THRESHOLD PULSEWIDTH: 0.4 ms
MDC IDC SESS DTM: 20150824123417

## 2014-03-23 ENCOUNTER — Ambulatory Visit (INDEPENDENT_AMBULATORY_CARE_PROVIDER_SITE_OTHER): Payer: Medicare Other | Admitting: Pharmacist

## 2014-03-23 DIAGNOSIS — Z7901 Long term (current) use of anticoagulants: Secondary | ICD-10-CM | POA: Diagnosis not present

## 2014-03-23 DIAGNOSIS — I4891 Unspecified atrial fibrillation: Secondary | ICD-10-CM

## 2014-03-23 LAB — POCT INR: INR: 1.7

## 2014-03-23 NOTE — Patient Instructions (Signed)
Anticoagulation Dose Instructions as of 03/23/2014     [name] Tue Wed Thu Fri Sat   New Dose 5 mg 2.5 mg 5 mg 2.5 mg 5 mg 2.5 mg 5 mg    Description       Take an extra 1/2 tablet of warfarin today, then continue warfarin 5mg  take 1/2 tablet mondays, wednesdays and fridays and 1 tablet all other days.      INR was 1.7 today (a little thick)

## 2014-03-28 ENCOUNTER — Encounter: Payer: Self-pay | Admitting: Cardiology

## 2014-04-07 ENCOUNTER — Other Ambulatory Visit: Payer: Self-pay | Admitting: Pharmacist

## 2014-04-07 ENCOUNTER — Other Ambulatory Visit: Payer: Self-pay | Admitting: Family Medicine

## 2014-04-17 ENCOUNTER — Encounter: Payer: Self-pay | Admitting: Internal Medicine

## 2014-04-20 ENCOUNTER — Ambulatory Visit (INDEPENDENT_AMBULATORY_CARE_PROVIDER_SITE_OTHER): Payer: Medicare Other | Admitting: Pharmacist

## 2014-04-20 DIAGNOSIS — I4891 Unspecified atrial fibrillation: Secondary | ICD-10-CM | POA: Diagnosis not present

## 2014-04-20 DIAGNOSIS — Z7901 Long term (current) use of anticoagulants: Secondary | ICD-10-CM | POA: Diagnosis not present

## 2014-04-20 LAB — POCT INR: INR: 2.6

## 2014-04-20 NOTE — Patient Instructions (Signed)
Anticoagulation Dose Instructions as of 04/20/2014     Dorene Grebe Tue Wed Thu Fri Sat   New Dose 5 mg 2.5 mg 5 mg 2.5 mg 5 mg 2.5 mg 5 mg    Description       Continue warfarin 5mg  take 1/2 tablet mondays, wednesdays and fridays and 1 tablet all other days.      INR was 2.6 today

## 2014-05-18 ENCOUNTER — Ambulatory Visit (INDEPENDENT_AMBULATORY_CARE_PROVIDER_SITE_OTHER): Payer: Medicare Other | Admitting: Pharmacist

## 2014-05-18 DIAGNOSIS — Z23 Encounter for immunization: Secondary | ICD-10-CM

## 2014-05-18 DIAGNOSIS — I4891 Unspecified atrial fibrillation: Secondary | ICD-10-CM | POA: Diagnosis not present

## 2014-05-18 DIAGNOSIS — Z7901 Long term (current) use of anticoagulants: Secondary | ICD-10-CM

## 2014-05-18 LAB — POCT INR: INR: 2.7

## 2014-05-18 MED ORDER — FUROSEMIDE 20 MG PO TABS: 20.0000 mg | ORAL_TABLET | Freq: Every day | ORAL | Status: DC

## 2014-05-18 NOTE — Patient Instructions (Signed)
Anticoagulation Dose Instructions as of 05/18/2014     [name] Tue Wed Thu Fri Sat   New Dose 5 mg 2.5 mg 5 mg 2.5 mg 5 mg 2.5 mg 5 mg    Description       Continue warfarin 5mg  take 1/2 tablet mondays, wednesdays and fridays and 1 tablet all other days.      INR was 2.7 today

## 2014-06-02 ENCOUNTER — Other Ambulatory Visit: Payer: Self-pay | Admitting: Family Medicine

## 2014-06-12 ENCOUNTER — Encounter: Payer: Self-pay | Admitting: Family Medicine

## 2014-06-12 ENCOUNTER — Ambulatory Visit (INDEPENDENT_AMBULATORY_CARE_PROVIDER_SITE_OTHER): Payer: Medicare Other | Admitting: Family Medicine

## 2014-06-12 VITALS — BP 124/75 | HR 69 | Temp 97.2°F | Ht 66.0 in | Wt 137.0 lb

## 2014-06-12 DIAGNOSIS — I480 Paroxysmal atrial fibrillation: Secondary | ICD-10-CM

## 2014-06-12 DIAGNOSIS — E785 Hyperlipidemia, unspecified: Secondary | ICD-10-CM | POA: Diagnosis not present

## 2014-06-12 DIAGNOSIS — Z853 Personal history of malignant neoplasm of breast: Secondary | ICD-10-CM

## 2014-06-12 DIAGNOSIS — I10 Essential (primary) hypertension: Secondary | ICD-10-CM

## 2014-06-12 DIAGNOSIS — Z7901 Long term (current) use of anticoagulants: Secondary | ICD-10-CM

## 2014-06-12 DIAGNOSIS — E559 Vitamin D deficiency, unspecified: Secondary | ICD-10-CM | POA: Diagnosis not present

## 2014-06-12 LAB — POCT CBC
GRANULOCYTE PERCENT: 61 % (ref 37–80)
HEMATOCRIT: 37.3 % — AB (ref 37.7–47.9)
Hemoglobin: 12.2 g/dL (ref 12.2–16.2)
Lymph, poc: 2.2 (ref 0.6–3.4)
MCH, POC: 29.1 pg (ref 27–31.2)
MCHC: 32.8 g/dL (ref 31.8–35.4)
MCV: 88.8 fL (ref 80–97)
MPV: 7.6 fL (ref 0–99.8)
POC Granulocyte: 3.8 (ref 2–6.9)
POC LYMPH PERCENT: 34.3 %L (ref 10–50)
Platelet Count, POC: 225 10*3/uL (ref 142–424)
RBC: 4.2 M/uL (ref 4.04–5.48)
RDW, POC: 13.7 %
WBC: 6.3 10*3/uL (ref 4.6–10.2)

## 2014-06-12 LAB — POCT INR: INR: 2.8

## 2014-06-12 MED ORDER — INTEGRA 62.5-62.5-40-3 MG PO CAPS: ORAL_CAPSULE | ORAL | Status: DC

## 2014-06-12 MED ORDER — WARFARIN SODIUM 5 MG PO TABS: ORAL_TABLET | ORAL | Status: DC

## 2014-06-12 MED ORDER — FUROSEMIDE 20 MG PO TABS: 20.0000 mg | ORAL_TABLET | Freq: Every day | ORAL | Status: DC

## 2014-06-12 MED ORDER — LOSARTAN POTASSIUM 50 MG PO TABS: ORAL_TABLET | ORAL | Status: DC

## 2014-06-12 NOTE — Progress Notes (Signed)
Subjective:    Patient ID: [name], female    DOB: October 02, 1916, 80 y.o.   MRN: [id number]  HPI Pt here for follow up and management of chronic medical problems. The patient appears to be in good spirits and is doing well today with no particular complaints especially for being 80 years old. She is getting lab work today and a ProTime. She needs refills on all of her medicines. The patient is doing wonderful and is soon to be 80 years old. She is alert and pleasant.         Patient Active Problem List   Diagnosis Date Noted  . Ventral hernia 12/08/2013  . Vitamin D deficiency 12/08/2013  . History of breast cancer 12/08/2013  . Long term (current) use of anticoagulants 05/05/2013  . Shortness of breath 05/02/2013  . Restless leg syndrome 10/12/2010  . CAROTID BRUIT 08/21/2010  . MITRAL REGURGITATION 08/01/2009  . HYPERTENSION, BENIGN 08/17/2008  . AV BLOCK, COMPLETE 08/17/2008  . ATRIAL FIBRILLATION 08/17/2008  . PACEMAKER, PERMANENT 08/17/2008   Outpatient Encounter Prescriptions as of 06/12/2014  Medication Sig  . b complex vitamins tablet Take 1 tablet by mouth daily.   . Cholecalciferol (VITAMIN D3) 1000 UNITS tablet Take 2,000 Units by mouth daily.    . Fe Fum-FePoly-Vit C-Vit B3 (INTEGRA) 62.5-62.5-40-3 MG CAPS TAKE ONE CAPSULE BY MOUTH ONE TIME DAILY  . fish oil-omega-3 fatty acids 1000 MG capsule Take 2 g by mouth daily.  . furosemide (LASIX) 20 MG tablet Take 1 tablet (20 mg total) by mouth daily.  Marland Kitchen losartan (COZAAR) 50 MG tablet TAKE ONE HALF TABLET BY MOUTH EVERY DAY  . polyethylene glycol (MIRALAX / GLYCOLAX) packet Take 17 g by mouth 2 (two) times daily.  . vitamin C (ASCORBIC ACID) 500 MG tablet Take 500 mg by mouth daily.    . vitamin E 400 UNIT capsule Take 400 Units by mouth daily.  Marland Kitchen warfarin (COUMADIN) 5 MG tablet TAKE 1/2 OR 1 TABLET BY MOUTH ONCE A DAY OR AS INSTRUCTED BY COUMADIN ANTICOAGULATION CLINIC    Review of Systems  Constitutional:  Negative.   HENT: Negative.   Eyes: Negative.   Respiratory: Negative.   Cardiovascular: Negative.   Gastrointestinal: Negative.   Endocrine: Negative.   Genitourinary: Negative.   Musculoskeletal: Negative.   Skin: Negative.   Allergic/Immunologic: Negative.   Neurological: Negative.   Hematological: Negative.   Psychiatric/Behavioral: Negative.        Objective:   Physical Exam  Constitutional: She is oriented to person, place, and time. She appears well-developed and well-nourished. No distress.  HENT:  Head: Normocephalic and atraumatic.  Right Ear: External ear normal.  Left Ear: External ear normal.  Nose: Nose normal.  Mouth/Throat: Oropharynx is clear and moist. No oropharyngeal exudate.  Eyes: Conjunctivae and EOM are normal. Pupils are equal, round, and reactive to light. Right eye exhibits no discharge. Left eye exhibits no discharge. No scleral icterus.  Neck: Normal range of motion. Neck supple. No thyromegaly present.  Cardiovascular: Normal rate, regular rhythm and intact distal pulses.  Exam reveals no gallop and no friction rub.   Murmur heard. The heart was regular at 72/m  Pulmonary/Chest: Effort normal and breath sounds normal. No respiratory distress. She has no wheezes. She has no rales. She exhibits no tenderness.  Abdominal: Soft. Bowel sounds are normal. She exhibits no mass. There is no tenderness. There is no rebound and no guarding.  Ventral hernia  Musculoskeletal: Normal range of  motion. She exhibits no edema or tenderness.  Lymphadenopathy:    She has no cervical adenopathy.  Neurological: She is alert and oriented to person, place, and time. She has normal reflexes. No cranial nerve deficit.  Skin: Skin is warm and dry. No rash noted.  Psychiatric: She has a normal mood and affect. Her behavior is normal. Judgment and thought content normal.  Nursing note and vitals reviewed.  BP 124/75 mmHg  Pulse 69  Temp(Src) 97.2 F (36.2 C) (Oral)  Ht  5' 6"  (1.676 m)  Wt 137 lb (62.143 kg)  BMI 22.12 kg/m2        Assessment & Plan:  1. HYPERTENSION, BENIGN - POCT CBC - BMP8+EGFR - Hepatic function panel  2. Vitamin D deficiency - POCT CBC - Vit D  25 hydroxy (rtn osteoporosis monitoring)  3. History of breast cancer - POCT CBC  4. Hyperlipemia - POCT CBC - Lipid panel  5. Long term current use of anticoagulant therapy - POCT INR  6. Atrial fibrillation - POCT INR  Meds ordered this encounter  Medications  . warfarin (COUMADIN) 5 MG tablet    Sig: TAKE 1/2 OR 1 TABLET BY MOUTH ONCE A DAY OR AS INSTRUCTED BY COUMADIN ANTICOAGULATION CLINIC    Dispense:  90 tablet    Refill:  3  . losartan (COZAAR) 50 MG tablet    Sig: TAKE ONE HALF TABLET BY MOUTH EVERY DAY    Dispense:  45 tablet    Refill:  11  . furosemide (LASIX) 20 MG tablet    Sig: Take 1 tablet (20 mg total) by mouth daily.    Dispense:  90 tablet    Refill:  3  . Fe Fum-FePoly-Vit C-Vit B3 (INTEGRA) 62.5-62.5-40-3 MG CAPS    Sig: TAKE ONE CAPSULE BY MOUTH ONE TIME DAILY    Dispense:  30 capsule    Refill:  11   Patient Instructions                        Medicare Annual Wellness Visit  [facility] and the medical providers at [facility] strive to bring you the best medical care.  In doing so we not only want to address your current medical conditions and concerns but also to detect new conditions early and prevent illness, disease and health-related problems.    Medicare offers a yearly Wellness Visit which allows our clinical staff to assess your need for preventative services including immunizations, lifestyle education, counseling to decrease risk of preventable diseases and screening for fall risk and other medical concerns.    This visit is provided free of charge (no copay) for all Medicare recipients. The clinical pharmacists at [facility] have begun to conduct these Wellness Visits which will  also include a thorough review of all your medications.    As you primary medical provider recommend that you make an appointment for your Annual Wellness Visit if you have not done so already this year.  You may set up this appointment before you leave today or you may call back ([phone]) and schedule an appointment.  Please make sure when you call that you mention that you are scheduling your Annual Wellness Visit with the clinical pharmacist so that the appointment may be made for the proper length of time.     Continue current medications. Continue good therapeutic lifestyle changes which include good diet and exercise. Fall precautions discussed with patient. If  an FOBT was given today- please return it to our front desk. If you are over 34 years old - you may need Prevnar 32 or the adult Pneumonia vaccine.  Flu Shots will be available at our office starting mid- September. Please call and schedule a FLU CLINIC APPOINTMENT.   Continue to be careful and did not put herself at risk for falling Get your eye exam as planned Move slowly Drink plenty of fluids this winter and keep yourself well hydrated  Anticoagulation Dose Instructions as of 06/12/2014      Dorene Grebe Tue Wed Thu Fri Sat   New Dose 5 mg 2.5 mg 5 mg 2.5 mg 5 mg 2.5 mg 5 mg    Description        Continue warfarin 55m take 1/2 tablet mondays, wednesdays and fridays and 1 tablet all other days.     INR was 2.8 today   DArrie SenateMD

## 2014-06-12 NOTE — Patient Instructions (Addendum)
Medicare Annual Wellness Visit  [facility] and the medical providers at [facility] strive to bring you the best medical care.  In doing so we not only want to address your current medical conditions and concerns but also to detect new conditions early and prevent illness, disease and health-related problems.    Medicare offers a yearly Wellness Visit which allows our clinical staff to assess your need for preventative services including immunizations, lifestyle education, counseling to decrease risk of preventable diseases and screening for fall risk and other medical concerns.    This visit is provided free of charge (no copay) for all Medicare recipients. The clinical pharmacists at [facility] have begun to conduct these Wellness Visits which will also include a thorough review of all your medications.    As you primary medical provider recommend that you make an appointment for your Annual Wellness Visit if you have not done so already this year.  You may set up this appointment before you leave today or you may call back ([phone]) and schedule an appointment.  Please make sure when you call that you mention that you are scheduling your Annual Wellness Visit with the clinical pharmacist so that the appointment may be made for the proper length of time.     Continue current medications. Continue good therapeutic lifestyle changes which include good diet and exercise. Fall precautions discussed with patient. If an FOBT was given today- please return it to our front desk. If you are over 41 years old - you may need Prevnar 16 or the adult Pneumonia vaccine.  Flu Shots will be available at our office starting mid- September. Please call and schedule a FLU CLINIC APPOINTMENT.   Continue to be careful and did not put herself at risk for falling Get your eye exam as planned Move slowly Drink plenty of fluids this winter and  keep yourself well hydrated  Anticoagulation Dose Instructions as of 06/12/2014      Dorene Grebe Tue Wed Thu Fri Sat   New Dose 5 mg 2.5 mg 5 mg 2.5 mg 5 mg 2.5 mg 5 mg    Description        Continue warfarin 5mg  take 1/2 tablet mondays, wednesdays and fridays and 1 tablet all other days.     INR was 2.8 today

## 2014-06-13 LAB — BMP8+EGFR
BUN / CREAT RATIO: 22 (ref 11–26)
BUN: 19 mg/dL (ref 10–36)
CO2: 28 mmol/L (ref 18–29)
CREATININE: 0.87 mg/dL (ref 0.57–1.00)
Calcium: 10.2 mg/dL (ref 8.7–10.3)
Chloride: 95 mmol/L — ABNORMAL LOW (ref 97–108)
GFR calc Af Amer: 66 mL/min/{1.73_m2} (ref >59)
GFR calc non Af Amer: 57 mL/min/{1.73_m2} — ABNORMAL LOW (ref >59)
Glucose: 109 mg/dL — ABNORMAL HIGH (ref 65–99)
Potassium: 4.4 mmol/L (ref 3.5–5.2)
Sodium: 138 mmol/L (ref 134–144)

## 2014-06-13 LAB — LIPID PANEL
CHOL/HDL RATIO: 4.4 ratio (ref 0.0–4.4)
Cholesterol, Total: 249 mg/dL — ABNORMAL HIGH (ref 100–199)
HDL: 56 mg/dL (ref >39)
LDL CALC: 173 mg/dL — AB (ref 0–99)
Triglycerides: 100 mg/dL (ref 0–149)
VLDL Cholesterol Cal: 20 mg/dL (ref 5–40)

## 2014-06-13 LAB — HEPATIC FUNCTION PANEL
ALBUMIN: 5 g/dL — AB (ref 3.2–4.6)
ALT: 16 IU/L (ref 0–32)
AST: 25 IU/L (ref 0–40)
Alkaline Phosphatase: 58 IU/L (ref 39–117)
BILIRUBIN TOTAL: 1 mg/dL (ref 0.0–1.2)
Bilirubin, Direct: 0.23 mg/dL (ref 0.00–0.40)
TOTAL PROTEIN: 7.5 g/dL (ref 6.0–8.5)

## 2014-06-13 LAB — VITAMIN D 25 HYDROXY (VIT D DEFICIENCY, FRACTURES): VIT D 25 HYDROXY: 40.6 ng/mL (ref 30.0–100.0)

## 2014-06-19 ENCOUNTER — Ambulatory Visit: Payer: Medicare Other | Admitting: Internal Medicine

## 2014-06-19 ENCOUNTER — Encounter: Payer: Medicare Other | Admitting: Internal Medicine

## 2014-06-19 DIAGNOSIS — Z961 Presence of intraocular lens: Secondary | ICD-10-CM | POA: Diagnosis not present

## 2014-06-19 DIAGNOSIS — H11153 Pinguecula, bilateral: Secondary | ICD-10-CM | POA: Diagnosis not present

## 2014-07-03 ENCOUNTER — Encounter: Payer: Medicare Other | Admitting: Internal Medicine

## 2014-07-06 ENCOUNTER — Encounter (HOSPITAL_COMMUNITY): Payer: Self-pay | Admitting: Internal Medicine

## 2014-07-17 ENCOUNTER — Ambulatory Visit (INDEPENDENT_AMBULATORY_CARE_PROVIDER_SITE_OTHER): Payer: Medicare Other | Admitting: Pharmacist

## 2014-07-17 DIAGNOSIS — R300 Dysuria: Secondary | ICD-10-CM | POA: Diagnosis not present

## 2014-07-17 DIAGNOSIS — Z7901 Long term (current) use of anticoagulants: Secondary | ICD-10-CM | POA: Diagnosis not present

## 2014-07-17 DIAGNOSIS — I4891 Unspecified atrial fibrillation: Secondary | ICD-10-CM | POA: Diagnosis not present

## 2014-07-17 LAB — POCT UA - MICROSCOPIC ONLY
Casts, Ur, LPF, POC: NEGATIVE
Crystals, Ur, HPF, POC: NEGATIVE
Mucus, UA: NEGATIVE
YEAST UA: NEGATIVE

## 2014-07-17 LAB — POCT URINALYSIS DIPSTICK
Bilirubin, UA: NEGATIVE
Glucose, UA: NEGATIVE
Ketones, UA: NEGATIVE
NITRITE UA: NEGATIVE
Spec Grav, UA: 1.015
UROBILINOGEN UA: NEGATIVE
pH, UA: 6

## 2014-07-17 LAB — POCT INR: INR: 2.1

## 2014-07-17 MED ORDER — INTEGRA 62.5-62.5-40-3 MG PO CAPS: ORAL_CAPSULE | ORAL | Status: DC

## 2014-07-17 NOTE — Addendum Note (Signed)
Addended by: Selmer Dominion on: 07/17/2014 11:43 AM   Modules accepted: Orders

## 2014-07-17 NOTE — Patient Instructions (Signed)
Anticoagulation Dose Instructions as of 07/17/2014      [name] Tue Wed Thu Fri Sat   New Dose 5 mg 2.5 mg 5 mg 2.5 mg 5 mg 2.5 mg 5 mg    Description        Continue warfarin 5mg  take 1/2 tablet mondays, wednesdays and fridays and 1 tablet all other days.      INR was 2.1 today

## 2014-07-18 LAB — URINE CULTURE: Organism ID, Bacteria: NO GROWTH

## 2014-07-29 ENCOUNTER — Other Ambulatory Visit: Payer: Self-pay | Admitting: Family Medicine

## 2014-08-02 ENCOUNTER — Other Ambulatory Visit: Payer: Self-pay | Admitting: *Deleted

## 2014-08-02 NOTE — Telephone Encounter (Signed)
I would be glad to send in - but need to verify dosage and if patient is getting bottles or packets.  Tried to caller her daughter but had to leave message.

## 2014-08-02 NOTE — Telephone Encounter (Signed)
[name], [name] gets one bottle per month.

## 2014-08-02 NOTE — Telephone Encounter (Signed)
[name] phoned this am to see if I would ask you to order her mother's generic miralax for 3 mos instead of 1 month. Thanks

## 2014-08-03 ENCOUNTER — Other Ambulatory Visit: Payer: Self-pay | Admitting: Pharmacist

## 2014-08-03 MED ORDER — POLYETHYLENE GLYCOL 3350 17 GM/SCOOP PO POWD: ORAL | Status: DC

## 2014-08-03 NOTE — Telephone Encounter (Signed)
rx amended to reflect that patient gets bottles of Miralax and not packets. Also changed to 90 day supply.  Sent electronically to [facility].

## 2014-08-10 ENCOUNTER — Ambulatory Visit (INDEPENDENT_AMBULATORY_CARE_PROVIDER_SITE_OTHER): Payer: Medicare Other | Admitting: Internal Medicine

## 2014-08-10 ENCOUNTER — Encounter: Payer: Self-pay | Admitting: Internal Medicine

## 2014-08-10 VITALS — BP 130/80 | HR 70 | Ht 66.0 in | Wt 143.6 lb

## 2014-08-10 DIAGNOSIS — I482 Chronic atrial fibrillation, unspecified: Secondary | ICD-10-CM

## 2014-08-10 DIAGNOSIS — I442 Atrioventricular block, complete: Secondary | ICD-10-CM

## 2014-08-10 DIAGNOSIS — Z45018 Encounter for adjustment and management of other part of cardiac pacemaker: Secondary | ICD-10-CM | POA: Diagnosis not present

## 2014-08-10 NOTE — Progress Notes (Signed)
Patient Care Team: Chipper Herb, MD as PCP - General (Family Medicine) Minus Breeding, MD as Consulting Physician (Cardiology) Deboraha Sprang, MD as Consulting Physician (Cardiology) Garald Balding, MD as Consulting Physician (Orthopedic Surgery) Williams Che, MD as Consulting Physician (Ophthalmology)   HPI  [name] is a 81 y.o. female Seen in followup for complete heart block. She status post pacemaker and underwent generator replacement 12/13 at [facility].  She has a history of atrial fibrillation-permanent and status post AV junction ablation.  She has known moderate aortic stenosis. she has severe biatrial enlargement and moderate to severe tricuspid regurgitation by echo 2/14.  She denies significant shortness of breath. There has been no peripheral edema or palpitations.    Past Medical History  Diagnosis Date  . Atrial fibrillation   . Hypertension   . Heart failure, diastolic   . Carotid bruit   . S/P AV nodal ablation   . Ovarian cyst   . Colon disorder     Adhesions  . QT prolongation     With solatol  . Nephrolithiasis   . Mitral regurgitation     Moderate  . Hyperlipidemia     statin intolerant  . Restless leg syndrome     Past Surgical History  Procedure Laterality Date  . Pacemaker insertion      St. Jude  . Cystectomy      Ovarian  . Abdominal adhesion surgery      twice  . Cataract extraction      Bilaterally  . Colon surgery      Colonic obstruction secondary to diverticulitis with resection  . Joint replacement Right 2004    hip  . Breast surgery Left 2010    lumpectomy  . Permanent pacemaker generator change N/A 07/22/2012    Procedure: PERMANENT PACEMAKER GENERATOR CHANGE;  Surgeon: Deboraha Sprang, MD;  Location: [facility] CATH LAB;  Service: Cardiovascular;  Laterality: N/A;    Current Outpatient Prescriptions  Medication Sig Dispense Refill  . b complex vitamins tablet Take 1 tablet by mouth daily.     . Cholecalciferol (VITAMIN  D3) 1000 UNITS tablet Take 2,000 Units by mouth daily.      . Fe Fum-FePoly-Vit C-Vit B3 (INTEGRA) 62.5-62.5-40-3 MG CAPS TAKE ONE CAPSULE BY MOUTH ONE TIME DAILY 90 capsule 3  . fish oil-omega-3 fatty acids 1000 MG capsule Take 2 g by mouth daily.    . furosemide (LASIX) 20 MG tablet Take 1 tablet (20 mg total) by mouth daily. 90 tablet 3  . losartan (COZAAR) 50 MG tablet TAKE ONE HALF TABLET BY MOUTH EVERY DAY 45 tablet 1  . polyethylene glycol powder (GLYCOLAX/MIRALAX) powder Mix 17 grams of powder with water, tea, coffee and drink mixture once daily as needed for constipation 765 g 1  . vitamin C (ASCORBIC ACID) 500 MG tablet Take 500 mg by mouth daily.      . vitamin E 400 UNIT capsule Take 400 Units by mouth daily.    Marland Kitchen warfarin (COUMADIN) 5 MG tablet TAKE 1/2 OR 1 TABLET BY MOUTH ONCE A DAY OR AS INSTRUCTED BY COUMADIN ANTICOAGULATION CLINIC 90 tablet 3   No current facility-administered medications for this visit.    Allergies  Allergen Reactions  . Atorvastatin     unknown  . Boniva [Ibandronate Sodium]     unknown  . Chocolate     unknown  . Lovastatin     unknown  . Pravastatin Sodium  unknown  . Risedronate Sodium     unknown  . Toprol Xl [Metoprolol Succinate]     unknown  . Visken [Pindolol]     unknown  . Welchol [Colesevelam Hcl]     unknown    Review of Systems negative except from HPI and PMH  Physical Exam BP 130/80 mmHg  Pulse 70  Ht 5\' 6"  (1.676 m)  Wt 143 lb 9.6 oz (65.137 kg)  BMI 23.19 kg/m2 Well developed and well nourished in no acute distress HENT normal E scleral and icterus clear Neck Supple JVP flat; carotids brisk and full   RRR  3/ 6 murmur>>apex no murmurs gallops or rub Soft with active bowel sounds No clubbing cyanosis no  Edema Alert and oriented, grossly normal motor and sensory function Skin Warm and Dry  ECG demonstrates atrial fibrillation with underlying ventricular pacing   Assessment and  Plan  Atrial  fibrillation-permanent   Pacemaker-St. Jude   Complete heart block   She remains on anticoagulation with warfarin. Pacemaker function is normal.

## 2014-08-10 NOTE — Patient Instructions (Signed)
Your physician recommends that you continue on your current medications as directed. Please refer to the Current Medication list given to you today.  Your physician wants you to follow-up in: 6 months with Dr. Percival Spanish.  You will receive a reminder letter in the mail two months in advance. If you don't receive a letter, please call our office to schedule the follow-up appointment.  Your physician wants you to follow-up in: 1 year with Dr. Caryl Comes.  You will receive a reminder letter in the mail two months in advance. If you don't receive a letter, please call our office to schedule the follow-up appointment.

## 2014-08-11 LAB — MDC_IDC_ENUM_SESS_TYPE_INCLINIC
Battery Remaining Longevity: 150 mo
Battery Voltage: 2.98 V
Brady Statistic RV Percent Paced: 99.96 %
Date Time Interrogation Session: 20160114174419
Implantable Pulse Generator Serial Number: 7331332
Lead Channel Impedance Value: 475 Ohm
Lead Channel Pacing Threshold Amplitude: 0.625 V
Lead Channel Pacing Threshold Pulse Width: 0.4 ms
Lead Channel Sensing Intrinsic Amplitude: 12 mV
Lead Channel Setting Pacing Amplitude: 0.875
Lead Channel Setting Sensing Sensitivity: 4 mV
MDC IDC SET LEADCHNL RV PACING PULSEWIDTH: 0.4 ms

## 2014-08-16 ENCOUNTER — Encounter: Payer: Self-pay | Admitting: Internal Medicine

## 2014-08-22 ENCOUNTER — Other Ambulatory Visit: Payer: Self-pay | Admitting: Family Medicine

## 2014-08-24 ENCOUNTER — Ambulatory Visit (INDEPENDENT_AMBULATORY_CARE_PROVIDER_SITE_OTHER): Payer: Medicare Other | Admitting: Pharmacist

## 2014-08-24 ENCOUNTER — Telehealth: Payer: Self-pay | Admitting: Pharmacist

## 2014-08-24 DIAGNOSIS — Z7901 Long term (current) use of anticoagulants: Secondary | ICD-10-CM | POA: Diagnosis not present

## 2014-08-24 DIAGNOSIS — I4891 Unspecified atrial fibrillation: Secondary | ICD-10-CM

## 2014-08-24 DIAGNOSIS — I482 Chronic atrial fibrillation, unspecified: Secondary | ICD-10-CM

## 2014-08-24 LAB — POCT INR: INR: 2

## 2014-08-24 NOTE — Patient Instructions (Signed)
Anticoagulation Dose Instructions as of 08/24/2014      [name] Tue Wed Thu Fri Sat   New Dose 5 mg 2.5 mg 5 mg 2.5 mg 5 mg 2.5 mg 5 mg    Description        Continue warfarin 5mg  take 1/2 tablet mondays, wednesdays and fridays and 1 tablet all other days.      INR was 2.0 today.

## 2014-08-24 NOTE — Telephone Encounter (Signed)
No openings today.  Can she come Monday 08/28/14?

## 2014-09-25 ENCOUNTER — Ambulatory Visit: Payer: Self-pay

## 2014-10-02 DIAGNOSIS — L570 Actinic keratosis: Secondary | ICD-10-CM | POA: Diagnosis not present

## 2014-10-02 DIAGNOSIS — Z85828 Personal history of other malignant neoplasm of skin: Secondary | ICD-10-CM | POA: Diagnosis not present

## 2014-10-04 ENCOUNTER — Ambulatory Visit (INDEPENDENT_AMBULATORY_CARE_PROVIDER_SITE_OTHER): Payer: Medicare Other | Admitting: Pharmacist

## 2014-10-04 ENCOUNTER — Encounter: Payer: Self-pay | Admitting: Pharmacist

## 2014-10-04 VITALS — BP 128/60 | HR 64 | Ht 66.0 in | Wt 145.0 lb

## 2014-10-04 DIAGNOSIS — I4891 Unspecified atrial fibrillation: Secondary | ICD-10-CM | POA: Diagnosis not present

## 2014-10-04 DIAGNOSIS — Z7901 Long term (current) use of anticoagulants: Secondary | ICD-10-CM

## 2014-10-04 DIAGNOSIS — Z0000 Encounter for general adult medical examination without abnormal findings: Secondary | ICD-10-CM

## 2014-10-04 LAB — POCT INR: INR: 3.7

## 2014-10-04 MED ORDER — POLYETHYLENE GLYCOL 3350 17 GM/SCOOP PO POWD: ORAL | Status: DC

## 2014-10-04 NOTE — Patient Instructions (Addendum)
Anticoagulation Dose Instructions as of 10/04/2014      Dorene Grebe Tue Wed Thu Fri Sat   New Dose 5 mg 2.5 mg 5 mg 2.5 mg 5 mg 2.5 mg 2.5 mg    Description        No warfarin tomorrow - Thursday, March 10th.  Then decrease dose to  warfarin 5mg  take 1/2 tablet mondays, wednesdays, fridays and saturdays.  Take 1 tablet on Sundays, tuesdays and thrusdays      INR was 3.7 today (too thin)   Health Maintenance Summary     DEXA SCAN Last done Over 2 year ago Due now    ZOSTAVAX Never done  Due now    INFLUENZA VACCINE Next Due 02/26/2015 Last done 05/18/2014    TETANUS/TDAP Next Due Verifying last administration Due every 10 years   Pneumonia vaccine Completed     Bone Density Due now     Mammogram Due now      COLONOSCOPY Next Due 12/26/2021 Last done 12/27/2011      Fall Prevention and Home Safety Falls cause injuries and can affect all age groups. It is possible to use preventive measures to significantly decrease the likelihood of falls. There are many simple measures which can make your home safer and prevent falls. OUTDOORS  Repair cracks and edges of walkways and driveways.  Remove high doorway thresholds.  Trim shrubbery on the main path into your home.  Have good outside lighting.  Clear walkways of tools, rocks, debris, and clutter.  Check that handrails are not broken and are securely fastened. Both sides of steps should have handrails.  Have leaves, snow, and ice cleared regularly.  Use sand or salt on walkways during winter months.  In the garage, clean up grease or oil spills. BATHROOM  Install night lights.  Install grab bars by the toilet and in the tub and shower.  Use non-skid mats or decals in the tub or shower.  Place a plastic non-slip stool in the shower to sit on, if needed.  Keep floors dry and clean up all water on the floor immediately.  Remove soap buildup in the tub or shower on a regular basis.  Secure bath mats with non-slip, double-sided  rug tape.  Remove throw rugs and tripping hazards from the floors. BEDROOMS  Install night lights.  Make sure a bedside light is easy to reach.  Do not use oversized bedding.  Keep a telephone by your bedside.  Have a firm chair with side arms to use for getting dressed.  Remove throw rugs and tripping hazards from the floor. KITCHEN  Keep handles on pots and pans turned toward the center of the stove. Use back burners when possible.  Clean up spills quickly and allow time for drying.  Avoid walking on wet floors.  Avoid hot utensils and knives.  Position shelves so they are not too high or low.  Place commonly used objects within easy reach.  If necessary, use a sturdy step stool with a grab bar when reaching.  Keep electrical cables out of the way.  Do not use floor polish or wax that makes floors slippery. If you must use wax, use non-skid floor wax.  Remove throw rugs and tripping hazards from the floor. STAIRWAYS  Never leave objects on stairs.  Place handrails on both sides of stairways and use them. Fix any loose handrails. Make sure handrails on both sides of the stairways are as long as the stairs.  Check carpeting  to make sure it is firmly attached along stairs. Make repairs to worn or loose carpet promptly.  Avoid placing throw rugs at the top or bottom of stairways, or properly secure the rug with carpet tape to prevent slippage. Get rid of throw rugs, if possible.  Have an electrician put in a light switch at the top and bottom of the stairs. OTHER FALL PREVENTION TIPS  Wear low-heel or rubber-soled shoes that are supportive and fit well. Wear closed toe shoes.  When using a stepladder, make sure it is fully opened and both spreaders are firmly locked. Do not climb a closed stepladder.  Add color or contrast paint or tape to grab bars and handrails in your home. Place contrasting color strips on first and last steps.  Learn and use mobility aids as  needed. Install an electrical emergency response system.  Turn on lights to avoid dark areas. Replace light bulbs that burn out immediately. Get light switches that glow.  Arrange furniture to create clear pathways. Keep furniture in the same place.  Firmly attach carpet with non-skid or double-sided tape.  Eliminate uneven floor surfaces.  Select a carpet pattern that does not visually hide the edge of steps.  Be aware of all pets. OTHER HOME SAFETY TIPS  Set the water temperature for 120 F (48.8 C).  Keep emergency numbers on or near the telephone.  Keep smoke detectors on every level of the home and near sleeping areas. Document Released: 07/04/2002 Document Revised: 01/13/2012 Document Reviewed: 10/03/2011 [facility] Patient Information 2015 [place], [place]. This information is not intended to replace advice given to you by your health care provider. Make sure you discuss any questions you have with your health care provider.

## 2014-10-04 NOTE — Progress Notes (Signed)
Patient ID: [name], female   DOB: [birth year], 81 y.o.   MRN: [id number]    Subjective:   [name] is a 81 y.o. female who presents for an Initial Medicare Annual Wellness Visit and recheck INR.   Current Medications (verified) Outpatient Encounter Prescriptions as of 10/04/2014  Medication Sig  . b complex vitamins tablet Take 1 tablet by mouth daily.   . Cholecalciferol (VITAMIN D3) 1000 UNITS tablet Take 2,000 Units by mouth daily.    . Fe Fum-FePoly-Vit C-Vit B3 (INTEGRA) 62.5-62.5-40-3 MG CAPS TAKE ONE CAPSULE BY MOUTH ONE TIME DAILY  . fish oil-omega-3 fatty acids 1000 MG capsule Take 2 g by mouth daily.  . furosemide (LASIX) 20 MG tablet Take 1 tablet (20 mg total) by mouth daily.  Marland Kitchen losartan (COZAAR) 50 MG tablet TAKE ONE HALF TABLET BY MOUTH EVERY DAY  . polyethylene glycol powder (GLYCOLAX/MIRALAX) powder Mix 17 grams of powder with water, tea, coffee and drink mixture once daily as needed for constipation  . vitamin C (ASCORBIC ACID) 500 MG tablet Take 500 mg by mouth daily.    . vitamin E 400 UNIT capsule Take 400 Units by mouth daily.  Marland Kitchen warfarin (COUMADIN) 5 MG tablet TAKE 1/2 OR 1 TABLET BY MOUTH ONCE A DAY OR AS INSTRUCTED BY COUMADIN ANTICOAGULATION CLINIC  . [DISCONTINUED] polyethylene glycol powder (GLYCOLAX/MIRALAX) powder Mix 17 grams of powder with water, tea, coffee and drink mixture once daily as needed for constipation    Allergies (verified) Atorvastatin; Boniva; Chocolate; Lovastatin; Pravastatin sodium; Risedronate sodium; Toprol xl; Visken; and Welchol   History: Past Medical History  Diagnosis Date  . Atrial fibrillation   . Hypertension   . Heart failure, diastolic   . Carotid bruit   . S/P AV nodal ablation   . Ovarian cyst   . Colon disorder     Adhesions  . QT prolongation     With solatol  . Nephrolithiasis   . Mitral regurgitation     Moderate  . Hyperlipidemia     statin intolerant  . Restless leg syndrome   . Cataract     Past Surgical History  Procedure Laterality Date  . Pacemaker insertion      St. Jude  . Cystectomy      Ovarian  . Abdominal adhesion surgery      twice  . Cataract extraction      Bilaterally  . Colon surgery      Colonic obstruction secondary to diverticulitis with resection  . Joint replacement Right 2004    hip  . Breast surgery Left 2010    lumpectomy  . Permanent pacemaker generator change N/A 07/22/2012    Procedure: PERMANENT PACEMAKER GENERATOR CHANGE;  Surgeon: Deboraha Sprang, MD;  Location: [facility] CATH LAB;  Service: Cardiovascular;  Laterality: N/A;  . Eye surgery     Family History  Problem Relation Age of Onset  . Diabetes Mother     type 2  . Diabetes Father     type 2  . Cancer Sister 13    colon   Social History   Occupational History  . Retired    Social History Main Topics  . Smoking status: Never Smoker   . Smokeless tobacco: Never Used  . Alcohol Use: No  . Drug Use: No  . Sexual Activity: No    Do you feel safe at home?  Yes   Objective:    Today's Vitals   10/04/14 1537  BP:  128/60  Pulse: 64  Height: 5\' 6"  (1.676 m)  Weight: 145 lb (65.772 kg)   INR = 3.7 today  Activities of Daily Living In your present state of health, do you have any difficulty performing the following activities: 10/04/2014  Is the patient deaf or have difficulty hearing? N  Hearing N  Vision N  Difficulty concentrating or making decisions N  Walking or climbing stairs? N  Doing errands, shopping? N  Preparing Food and eating ? N  Using the Toilet? N  In the past six months, have you accidently leaked urine? Y  Do you have problems with loss of bowel control? N  Managing your Medications? N  Managing your Finances? N  Housekeeping or managing your Housekeeping? N    Are there smokers in your home (other than you)? No  Hearing Difficulties: No Do you often ask people to speak up or repeat themselves? No Do you experience ringing or noises in your  ears? No Do you have difficulty understanding soft or whispered voices? No    Cardiac risk factors: advanced age (older than 58 for men, 67 for women), dyslipidemia and sedentary lifestyle.  Patient is statin intolerant  Depression Screen PHQ 2/9 Scores 10/04/2014 06/12/2014 01/12/2014  PHQ - 2 Score 0 0 0    Fall Risk Fall Risk  10/04/2014 06/12/2014 01/12/2014  Falls in the past year? Yes Yes No  Number falls in past yr: 1 1 -  Injury with Fall? No No -    Cognitive Function: MMSE - Mini Mental State Exam 10/04/2014  Orientation to time 5  Orientation to Place 5  Registration 3  Attention/ Calculation 2  Recall 3  Language- name 2 objects 2  Language- repeat 1  Language- follow 3 step command 3  Language- read & follow direction 1  Write a sentence 1  Copy design 1  Total score 27    Immunizations and Health Maintenance Immunization History  Administered Date(s) Administered  . Influenza Whole 04/27/2010  . Influenza,inj,Quad PF,36+ Mos 05/05/2013, 05/18/2014  . Pneumococcal Conjugate-13 12/08/2013  . Pneumococcal Polysaccharide-23 07/28/1998  . Td 07/28/2000   There are no preventive care reminders to display for this patient.  Patient Care Team: Chipper Herb, MD as PCP - General (Family Medicine) Minus Breeding, MD as Consulting Physician (Cardiology) Deboraha Sprang, MD as Consulting Physician (Cardiology) Garald Balding, MD as Consulting Physician (Orthopedic Surgery) Williams Che, MD as Consulting Physician (Ophthalmology)  Indicate any recent Medical Services you may have received from other than [facility] providers in the past year (date may be approximate).    Assessment:    Annual Wellness Visit  Supratherapeutic anticoagulation    Screening Tests Health Maintenance  Topic Date Due  . DEXA SCAN  04/06/2015 (Originally 07/26/1984)  . ZOSTAVAX  06/12/2016 (Originally 07/27/1979)  . INFLUENZA VACCINE  02/26/2015  . TETANUS/TDAP  03/28/2021  .  COLONOSCOPY  12/26/2021  . PNA vac Low Risk Adult  Completed        Plan:   During the course of the visit Azaya was educated and counseled about the following appropriate screening and preventive services:   Vaccines to include Pneumoccal, Influenza, Hepatitis B, Td, Zostavax - patient in need of Zostavax and Hep B but declined  Colorectal cancer screening - UTD  Cardiovascular disease screening - UTD, sees cardiologist soon  Diabetes screening - due to recheck [facility] in 1 month  Bone Denisty / Osteoporosis Screening - patient refuses  Mammogram -  patient refuses  PAP - no longer required  Glaucoma screening - UTD  Discussed fall prevention  Anticoagulation Dose Instructions as of 10/04/2014      Dorene Grebe Tue Wed Thu Fri Sat   New Dose 5 mg 2.5 mg 5 mg 2.5 mg 5 mg 2.5 mg 2.5 mg    Description        No warfarin tomorrow - Thursday, March 10th.  Then decrease dose to  warfarin 5mg  take 1/2 tablet mondays, wednesdays, fridays and saturdays.  Take 1 tablet on Sundays, tuesdays and thrusdays     RTC in 2-3 weeks   Patient Instructions (the written plan) were given to the patient.   Cherre Robins, [facility]   10/04/2014

## 2014-10-23 ENCOUNTER — Ambulatory Visit (INDEPENDENT_AMBULATORY_CARE_PROVIDER_SITE_OTHER): Payer: Medicare Other | Admitting: Pharmacist

## 2014-10-23 DIAGNOSIS — Z7901 Long term (current) use of anticoagulants: Secondary | ICD-10-CM | POA: Diagnosis not present

## 2014-10-23 DIAGNOSIS — I4891 Unspecified atrial fibrillation: Secondary | ICD-10-CM | POA: Diagnosis not present

## 2014-10-23 LAB — POCT INR: INR: 1.7

## 2014-10-23 NOTE — Patient Instructions (Signed)
Anticoagulation Dose Instructions as of 10/23/2014      Dorene Grebe Tue Wed Thu Fri Sat   New Dose 5 mg 2.5 mg 5 mg 2.5 mg 5 mg 2.5 mg 5 mg    Description        Take another 1/2 tablet today (monday, March 28th) when you get home.  Then change back to warfarin 5mg  - take 1/2 tablet on mondays, wednesdays and fridays only.  Take 1 tablet all other days.       INR was 1.7 today (just a little too thick)

## 2014-10-25 ENCOUNTER — Encounter: Payer: Self-pay | Admitting: Cardiology

## 2014-10-25 ENCOUNTER — Ambulatory Visit (INDEPENDENT_AMBULATORY_CARE_PROVIDER_SITE_OTHER): Payer: Medicare Other | Admitting: Cardiology

## 2014-10-25 VITALS — BP 126/80 | HR 70 | Ht 66.0 in | Wt 142.0 lb

## 2014-10-25 DIAGNOSIS — I10 Essential (primary) hypertension: Secondary | ICD-10-CM

## 2014-10-25 NOTE — Progress Notes (Signed)
HPI The patient presents for one-year followup. Since I last saw her she has had no new cardiovascular complaints. She denies any chest pressure, neck or arm discomfort. She's had no new shortness of breath, PND or orthopnea. She notices no palpitations, presyncope or syncope.  She still does her cooking and light housekeeping.  Her daughter lives in an apartment attached to her house.  She saw Dr. Caryl Comes recently.  She has had a generator replaced.   Allergies  Allergen Reactions  . Atorvastatin     unknown  . Boniva [Ibandronate Sodium]     unknown  . Chocolate     unknown  . Lovastatin     unknown  . Pravastatin Sodium     unknown  . Risedronate Sodium     unknown  . Toprol Xl [Metoprolol Succinate]     unknown  . Visken [Pindolol]     unknown  . Welchol [Colesevelam Hcl]     unknown    Current Outpatient Prescriptions  Medication Sig Dispense Refill  . b complex vitamins tablet Take 1 tablet by mouth daily.     . Cholecalciferol (VITAMIN D3) 1000 UNITS tablet Take 2,000 Units by mouth daily.      . Fe Fum-FePoly-Vit C-Vit B3 (INTEGRA) 62.5-62.5-40-3 MG CAPS TAKE ONE CAPSULE BY MOUTH ONE TIME DAILY 90 capsule 3  . fish oil-omega-3 fatty acids 1000 MG capsule Take 2 g by mouth daily.    . furosemide (LASIX) 20 MG tablet Take 1 tablet (20 mg total) by mouth daily. 90 tablet 3  . losartan (COZAAR) 50 MG tablet TAKE ONE HALF TABLET BY MOUTH EVERY DAY 45 tablet 1  . polyethylene glycol powder (GLYCOLAX/MIRALAX) powder Mix 17 grams of powder with water, tea, coffee and drink mixture once daily as needed for constipation 1530 g 1  . vitamin C (ASCORBIC ACID) 500 MG tablet Take 500 mg by mouth daily.      . vitamin E 400 UNIT capsule Take 400 Units by mouth daily.    Marland Kitchen warfarin (COUMADIN) 5 MG tablet TAKE 1/2 OR 1 TABLET BY MOUTH ONCE A DAY OR AS INSTRUCTED BY COUMADIN ANTICOAGULATION CLINIC 90 tablet 3   No current facility-administered medications for this visit.    Past  Medical History  Diagnosis Date  . Atrial fibrillation   . Hypertension   . Heart failure, diastolic   . Carotid bruit   . S/P AV nodal ablation   . Ovarian cyst   . Colon disorder     Adhesions  . QT prolongation     With solatol  . Nephrolithiasis   . Mitral regurgitation     Moderate  . Hyperlipidemia     statin intolerant  . Restless leg syndrome   . Cataract     Past Surgical History  Procedure Laterality Date  . Pacemaker insertion      St. Jude  . Cystectomy      Ovarian  . Abdominal adhesion surgery      twice  . Cataract extraction      Bilaterally  . Colon surgery      Colonic obstruction secondary to diverticulitis with resection  . Joint replacement Right 2004    hip  . Breast surgery Left 2010    lumpectomy  . Permanent pacemaker generator change N/A 07/22/2012    Procedure: PERMANENT PACEMAKER GENERATOR CHANGE;  Surgeon: Deboraha Sprang, MD;  Location: [facility] CATH LAB;  Service: Cardiovascular;  Laterality: N/A;  . Eye  surgery      ROS:  As stated in the HPI and negative for all other systems.  PHYSICAL EXAM BP 126/80 mmHg  Pulse 70  Ht 5\' 6"  (1.676 m)  Wt 142 lb (64.411 kg)  BMI 22.93 kg/m2 GENERAL:  Well appearing for her age NECK:  No jugular venous distention, waveform within normal limits, carotid upstroke brisk and symmetric, no bruits, transmitted systolic murmur on the right, no thyromegaly LUNGS:  Clear to auscultation bilaterally CHEST:  Well healed pacer pocket. HEART:  PMI not displaced or sustained,S1 and S2 within normal limits, no S3, clicks, no rubs, apical holosystolic murmur to the axilla, apical systolic murmur mid to late peaking and radiating out the aortic outflow tract with parvus et tardus. This ABD:  Flat, positive bowel sounds normal in frequency in pitch, no bruits, no rebound, no guarding, no midline pulsatile mass, no hepatomegaly, no splenomegaly EXT:  2 plus pulses throughout, no edema, no cyanosis no clubbing   EKG:   Atrial fibrillation, ventricular pacing 100% capture.  10/25/2014   ASSESSMENT AND PLAN  MITRAL REGURGITATION -  At this point I will follow this clinically.  No change in therapy is indicated.  No further imaging at this point.   AORTIC STENOSIS - She has had no this. She would want conservative therapy. No change in therapy is indicated.  HYPERTENSION, BENIGN -  The blood pressure is at target. No change in medications is indicated. We will continue with therapeutic lifestyle changes (TLC).   ATRIAL FIBRILLATION -  She tolerates anticoagulation. No change in therapy is indicated.  She does well with the warfarin and therefore I will not consider medication changes.

## 2014-10-25 NOTE — Patient Instructions (Signed)
The current medical regimen is effective;  continue present plan and medications.  Follow up in 1 year with Dr. Percival Spanish.  You will receive a letter in the mail 2 months before you are due.  Please call us when you receive this letter to schedule your follow up appointment.  Thank you for choosing [facility]!!

## 2014-11-01 ENCOUNTER — Encounter: Payer: Self-pay | Admitting: *Deleted

## 2014-11-01 NOTE — Progress Notes (Signed)
Levodopa is used for the treatment of intermittent restless leg syndrome and is reasonable for an "as-needed" basis. If used in the long-term it has the risk of augmentation or reduced effects with continued use. Short-term therapy is usually well tolerated with possible side effects of nausea or somnolence. Sinemet 25/100 is usually initiated at a dose of 1/2 to 1 tablet at bedtime. For long-term or daily treatment of restless leg syndrome, pramipexole or ropinirole are generally preferred and carry a lesser risk of augmentation.

## 2014-11-01 NOTE — Progress Notes (Signed)
[name] ,  Dr Percival Spanish sent a note to [facility] today about this pt. It seems that pt may have complained to him about restless leg, his office called a neurologist and they recommended Sinemet.  What are your  Thoughts of this med for this reason?

## 2014-11-01 NOTE — Progress Notes (Signed)
Try one half tablet of Sinemet 25 100 at bedtime as needed for restless leg #30, no refills

## 2014-11-02 ENCOUNTER — Other Ambulatory Visit: Payer: Self-pay | Admitting: *Deleted

## 2014-11-02 MED ORDER — CARBIDOPA-LEVODOPA 25-100 MG PO TABS
0.5000 | ORAL_TABLET | Freq: Every day | ORAL | Status: DC
Start: 2014-11-02 — End: 2014-11-20

## 2014-11-02 NOTE — Progress Notes (Signed)
Pt aware and med sent to pharm 

## 2014-11-09 ENCOUNTER — Ambulatory Visit (INDEPENDENT_AMBULATORY_CARE_PROVIDER_SITE_OTHER): Payer: Medicare Other | Admitting: *Deleted

## 2014-11-09 DIAGNOSIS — I442 Atrioventricular block, complete: Secondary | ICD-10-CM | POA: Diagnosis not present

## 2014-11-09 LAB — MDC_IDC_ENUM_SESS_TYPE_REMOTE
Battery Remaining Longevity: 137 mo
Battery Remaining Percentage: 91 %
Brady Statistic RV Percent Paced: 99 %
Implantable Pulse Generator Model: 1110
Implantable Pulse Generator Serial Number: 7331332
Lead Channel Pacing Threshold Amplitude: 0.625 V
Lead Channel Pacing Threshold Pulse Width: 0.4 ms
Lead Channel Setting Pacing Amplitude: 0.875
Lead Channel Setting Sensing Sensitivity: 4 mV
MDC IDC MSMT BATTERY VOLTAGE: 2.98 V
MDC IDC MSMT LEADCHNL RV IMPEDANCE VALUE: 460 Ohm
MDC IDC SESS DTM: 20160414121401
MDC IDC SET LEADCHNL RV PACING PULSEWIDTH: 0.4 ms

## 2014-11-09 NOTE — Progress Notes (Signed)
Remote pacemaker transmission.   

## 2014-11-20 ENCOUNTER — Ambulatory Visit (INDEPENDENT_AMBULATORY_CARE_PROVIDER_SITE_OTHER): Payer: Medicare Other | Admitting: Pharmacist

## 2014-11-20 DIAGNOSIS — I4891 Unspecified atrial fibrillation: Secondary | ICD-10-CM | POA: Diagnosis not present

## 2014-11-20 DIAGNOSIS — Z7901 Long term (current) use of anticoagulants: Secondary | ICD-10-CM

## 2014-11-20 LAB — POCT INR: INR: 2

## 2014-11-20 MED ORDER — CARBIDOPA-LEVODOPA 25-100 MG PO TABS: 1.0000 | ORAL_TABLET | Freq: Every day | ORAL | Status: DC

## 2014-11-20 NOTE — Patient Instructions (Addendum)
I recommend increase carbidopa / levadopa to 1 tablet daily for next 3-4 days.  If no improvement or if you experiences constipation then stop.   Anticoagulation Dose Instructions as of 11/20/2014      [name] Tue Wed Thu Fri Sat   New Dose 5 mg 2.5 mg 5 mg 2.5 mg 5 mg 2.5 mg 5 mg    Description        Continue warfarin 5mg  - take 1/2 tablet on mondays, wednesdays and fridays only.  Take 1 tablet all other days.

## 2014-11-20 NOTE — Progress Notes (Signed)
Patient reports that she has not seen an improvement in restless leg symptoms since starting carbidopa / levadopa over the last 2 weeks.   I recommended that she increase to 1 tablet daily for next 3-4 days.  If no improvement or is she experiences constipation I told her to stop.

## 2014-11-29 ENCOUNTER — Telehealth: Payer: Self-pay | Admitting: Pharmacist

## 2014-11-29 NOTE — Telephone Encounter (Signed)
Spoke with patient's daughter.  She states that [name] fell last week but no injuries resulted.  Mrs. Timbrook held her furosemide and notices that she was less dizzy and urinary incontinence was improves.  She was taking furosemide 3 times per week.  Per [name] her daughter Mrs. Pierro never has any LLE.   I recommended that patient take furosemide only as needed for LEE or if weight increases by 2 or more lbs in 24 hours or by 5 or more lbs in 1 week.  She is to contact office if weight does not decrease or if she is SOB.

## 2014-12-05 ENCOUNTER — Encounter: Payer: Self-pay | Admitting: Cardiology

## 2014-12-07 ENCOUNTER — Encounter: Payer: Self-pay | Admitting: Internal Medicine

## 2014-12-11 ENCOUNTER — Ambulatory Visit (INDEPENDENT_AMBULATORY_CARE_PROVIDER_SITE_OTHER): Payer: Medicare Other | Admitting: Family Medicine

## 2014-12-11 ENCOUNTER — Encounter: Payer: Self-pay | Admitting: Family Medicine

## 2014-12-11 VITALS — BP 154/83 | HR 85 | Temp 96.6°F | Ht 66.0 in | Wt 135.0 lb

## 2014-12-11 DIAGNOSIS — I480 Paroxysmal atrial fibrillation: Secondary | ICD-10-CM

## 2014-12-11 DIAGNOSIS — E785 Hyperlipidemia, unspecified: Secondary | ICD-10-CM | POA: Diagnosis not present

## 2014-12-11 DIAGNOSIS — I10 Essential (primary) hypertension: Secondary | ICD-10-CM

## 2014-12-11 DIAGNOSIS — Z950 Presence of cardiac pacemaker: Secondary | ICD-10-CM | POA: Diagnosis not present

## 2014-12-11 DIAGNOSIS — E559 Vitamin D deficiency, unspecified: Secondary | ICD-10-CM

## 2014-12-11 DIAGNOSIS — Z853 Personal history of malignant neoplasm of breast: Secondary | ICD-10-CM

## 2014-12-11 DIAGNOSIS — I080 Rheumatic disorders of both mitral and aortic valves: Secondary | ICD-10-CM

## 2014-12-11 DIAGNOSIS — R2681 Unsteadiness on feet: Secondary | ICD-10-CM | POA: Diagnosis not present

## 2014-12-11 DIAGNOSIS — R350 Frequency of micturition: Secondary | ICD-10-CM

## 2014-12-11 LAB — POCT UA - MICROSCOPIC ONLY
Casts, Ur, LPF, POC: NEGATIVE
Crystals, Ur, HPF, POC: NEGATIVE
Mucus, UA: NEGATIVE
RBC, URINE, MICROSCOPIC: NEGATIVE
Yeast, UA: NEGATIVE

## 2014-12-11 LAB — POCT URINALYSIS DIPSTICK
BILIRUBIN UA: NEGATIVE
GLUCOSE UA: NEGATIVE
KETONES UA: NEGATIVE
NITRITE UA: NEGATIVE
PH UA: 6.5
Protein, UA: NEGATIVE
RBC UA: NEGATIVE
Spec Grav, UA: 1.02
Urobilinogen, UA: NEGATIVE

## 2014-12-11 LAB — POCT CBC
Granulocyte percent: 56.1 %G (ref 37–80)
HCT, POC: 37.3 % — AB (ref 37.7–47.9)
HEMOGLOBIN: 11.2 g/dL — AB (ref 12.2–16.2)
Lymph, poc: 2.3 (ref 0.6–3.4)
MCH, POC: 26.6 pg — AB (ref 27–31.2)
MCHC: 30.1 g/dL — AB (ref 31.8–35.4)
MCV: 88.5 fL (ref 80–97)
MPV: 7 fL (ref 0–99.8)
POC Granulocyte: 3.5 (ref 2–6.9)
POC LYMPH PERCENT: 37.4 %L (ref 10–50)
Platelet Count, POC: 263 10*3/uL (ref 142–424)
RBC: 4.21 M/uL (ref 4.04–5.48)
RDW, POC: 14.2 %
WBC: 6.2 10*3/uL (ref 4.6–10.2)

## 2014-12-11 MED ORDER — LOSARTAN POTASSIUM 50 MG PO TABS: 25.0000 mg | ORAL_TABLET | Freq: Every day | ORAL | Status: DC

## 2014-12-11 MED ORDER — WARFARIN SODIUM 5 MG PO TABS: ORAL_TABLET | ORAL | Status: DC

## 2014-12-11 MED ORDER — CARBIDOPA-LEVODOPA 25-100 MG PO TABS: 1.0000 | ORAL_TABLET | Freq: Every day | ORAL | Status: DC

## 2014-12-11 NOTE — Progress Notes (Signed)
Subjective:    Patient ID: [name], female    DOB: 02/15/1917, 81 y.o.   MRN: [id number]  HPI Pt here for follow up and management of chronic medical problems which includes hypertension, atrial fibrillation, and hyperlipidemia. She is taking medications regularly. The patient today complains of some urinary frequency. She is due to return an FOBT and get lab work today. She recently stopped a fluid pill since last week due to the frequency. She still has some frequency. She has no burning or pain with voiding however. She stays active for her age. She plays [employer]. She is just weaker than she would like and did have a fall and was not hurt in the past 3 months. She denies chest pain or shortness of breath. She denies any GI symptoms. She takes iron and her stools are dark for this reason. She uses a cane when she is walking further distances. She is monitored closely by her daughter. She has a Presenter, broadcasting.      Patient Active Problem List   Diagnosis Date Noted  . Ventral hernia 12/08/2013  . Vitamin D deficiency 12/08/2013  . History of breast cancer 12/08/2013  . Long term (current) use of anticoagulants 05/05/2013  . Shortness of breath 05/02/2013  . Restless leg syndrome 10/12/2010  . CAROTID BRUIT 08/21/2010  . MITRAL REGURGITATION 08/01/2009  . HYPERTENSION, BENIGN 08/17/2008  . AV BLOCK, COMPLETE 08/17/2008  . ATRIAL FIBRILLATION 08/17/2008  . PACEMAKER, PERMANENT 08/17/2008   Outpatient Encounter Prescriptions as of 12/11/2014  Medication Sig  . b complex vitamins tablet Take 1 tablet by mouth daily.   . carbidopa-levodopa (SINEMET) 25-100 MG per tablet Take 1 tablet by mouth at bedtime.  . Cholecalciferol (VITAMIN D3) 1000 UNITS tablet Take 2,000 Units by mouth daily.    . Fe Fum-FePoly-Vit C-Vit B3 (INTEGRA) 62.5-62.5-40-3 MG CAPS TAKE ONE CAPSULE BY MOUTH ONE TIME DAILY  . fish oil-omega-3 fatty acids 1000 MG capsule Take 2 g by mouth daily.  . furosemide (LASIX) 20  MG tablet Take 1 tablet (20 mg total) by mouth daily as needed.  Marland Kitchen losartan (COZAAR) 50 MG tablet TAKE ONE HALF TABLET BY MOUTH EVERY DAY  . polyethylene glycol powder (GLYCOLAX/MIRALAX) powder Mix 17 grams of powder with water, tea, coffee and drink mixture once daily as needed for constipation  . vitamin C (ASCORBIC ACID) 500 MG tablet Take 500 mg by mouth daily.    . vitamin E 400 UNIT capsule Take 400 Units by mouth daily.  Marland Kitchen warfarin (COUMADIN) 5 MG tablet TAKE 1/2 OR 1 TABLET BY MOUTH ONCE A DAY OR AS INSTRUCTED BY COUMADIN ANTICOAGULATION CLINIC   No facility-administered encounter medications on file as of 12/11/2014.      Review of Systems  Constitutional: Negative.   HENT: Negative.   Eyes: Negative.   Respiratory: Negative.   Cardiovascular: Negative.   Gastrointestinal: Negative.   Endocrine: Negative.   Genitourinary: Positive for frequency (recently - also since has stopped lasix).  Musculoskeletal: Negative.   Skin: Negative.   Allergic/Immunologic: Negative.   Neurological: Negative.   Hematological: Negative.   Psychiatric/Behavioral: Negative.        Objective:   Physical Exam  Constitutional: She is oriented to person, place, and time. She appears well-developed and well-nourished. No distress.  The patient is doing great and  she is alert for her age of 81 years.  HENT:  Head: Normocephalic and atraumatic.  Right Ear: External ear normal.  Left Ear: External  ear normal.  Nose: Nose normal.  Mouth/Throat: Oropharynx is clear and moist. No oropharyngeal exudate.  Eyes: Conjunctivae and EOM are normal. Pupils are equal, round, and reactive to light. Right eye exhibits no discharge. Left eye exhibits no discharge. No scleral icterus.  Neck: Normal range of motion. Neck supple. No thyromegaly present.  Without bruits or thyromegaly  Cardiovascular: Normal rate, regular rhythm and intact distal pulses.  Exam reveals no gallop and no friction rub.   Murmur  heard. The heart is regular today at 72/m with a grade 2/6 systolic ejection murmur which is  apparently mitral valve prolapse  Pulmonary/Chest: Effort normal and breath sounds normal. No respiratory distress. She has no wheezes. She has no rales. She exhibits no tenderness.  The patient's permanent pacemaker appears to be intact without problems  Abdominal: Soft. Bowel sounds are normal. She exhibits no mass. There is no tenderness. There is no rebound and no guarding.  There is slight periumbilical tenderness near her hernia but no suprapubic tenderness  Musculoskeletal: Normal range of motion. She exhibits no edema or tenderness.  Lymphadenopathy:    She has no cervical adenopathy.  Neurological: She is alert and oriented to person, place, and time. She has normal reflexes. No cranial nerve deficit.  Skin: Skin is warm and dry. No rash noted.  Psychiatric: She has a normal mood and affect. Her behavior is normal. Judgment and thought content normal.  Nursing note and vitals reviewed.  BP 154/83 mmHg  Pulse 85  Temp(Src) 96.6 F (35.9 C) (Oral)  Ht _0  (1.676 m)  Wt 135 lb (61.236 kg)  BMI 21.80 kg/m2  Results for orders placed or performed in visit on 12/11/14  POCT UA - Microscopic Only  Result Value Ref Range   WBC, Ur, HPF, POC 5-10    RBC, urine, microscopic neg    Bacteria, U Microscopic occ    Mucus, UA neg    Epithelial cells, urine per micros occ    Crystals, Ur, HPF, POC neg    Casts, Ur, LPF, POC neg    Yeast, UA neg   POCT urinalysis dipstick  Result Value Ref Range   Color, UA yellow    Clarity, UA clear    Glucose, UA neg    Bilirubin, UA neg    Ketones, UA neg    Spec Grav, UA 1.020    Blood, UA neg    pH, UA 6.5    Protein, UA neg    Urobilinogen, UA negative    Nitrite, UA neg    Leukocytes, UA Trace    The patient was made aware of the urinalysis result and no treatment will be rendered unless the urine culture is positive.      Assessment &  Plan:  1. HYPERTENSION, BENIGN -The blood pressure is somewhat elevated today and we will ask her daughter to get some readings from home and bring these back for review and 3-4 weeks. - POCT CBC - BMP8+EGFR - Hepatic function panel  2. Vitamin D deficiency -Continue current treatment pending results of lab work - POCT CBC - Vit D  25 hydroxy (rtn osteoporosis monitoring)  3. History of breast cancer -Up with surgeon as directed - POCT CBC  4. Hyperlipemia -Continue with Fish oil and therapeutic lifestyle changes - POCT CBC - Lipid panel  5. Paroxysmal atrial fibrillation -The rhythm today is regular and the patient is having no problems with this and she follows up regularly with the cardiologist. -  POCT CBC - BMP8+EGFR - Hepatic function panel  6. Urinary frequency -A urine will be checked today in treatment will be rendered if there is any infection - POCT UA - Microscopic Only - POCT urinalysis dipstick  7. Gait instability -She was encouraged to use a cane more regularly to ensure her stability  8. PACEMAKER, PERMANENT -The patient follows up regularly with cardiology  9. MITRAL REGURGITATION -She follows up regularly with cardiology  Meds ordered this encounter  Medications  . carbidopa-levodopa (SINEMET) 25-100 MG per tablet    Sig: Take 1 tablet by mouth at bedtime.    Dispense:  30 tablet    Refill:  6  . losartan (COZAAR) 50 MG tablet    Sig: Take 0.5 tablets (25 mg total) by mouth daily.    Dispense:  45 tablet    Refill:  6  . warfarin (COUMADIN) 5 MG tablet    Sig: TAKE 1/2 OR 1 TABLET BY MOUTH ONCE A DAY OR AS INSTRUCTED BY COUMADIN ANTICOAGULATION CLINIC    Dispense:  90 tablet    Refill:  3   Patient Instructions                       Medicare Annual Wellness Visit  [facility] and the medical providers at [facility] strive to bring you the best medical care.  In doing so we not only want to address your current  medical conditions and concerns but also to detect new conditions early and prevent illness, disease and health-related problems.    Medicare offers a yearly Wellness Visit which allows our clinical staff to assess your need for preventative services including immunizations, lifestyle education, counseling to decrease risk of preventable diseases and screening for fall risk and other medical concerns.    This visit is provided free of charge (no copay) for all Medicare recipients. The clinical pharmacists at [facility] have begun to conduct these Wellness Visits which will also include a thorough review of all your medications.    As you primary medical provider recommend that you make an appointment for your Annual Wellness Visit if you have not done so already this year.  You may set up this appointment before you leave today or you may call back ([phone]) and schedule an appointment.  Please make sure when you call that you mention that you are scheduling your Annual Wellness Visit with the clinical pharmacist so that the appointment may be made for the proper length of time.     Continue current medications. Continue good therapeutic lifestyle changes which include good diet and exercise. Fall precautions discussed with patient. If an FOBT was given today- please return it to our front desk. If you are over 11 years old - you may need Prevnar 69 or the adult Pneumonia vaccine.  Flu Shots are still available at our office. If you still haven't had one please call to set up a nurse visit to get one.   After your visit with [place] today you will receive a survey in the mail or online from [employer] regarding your care with [place]. Please take a moment to fill this out. Your feedback is very important to [place] as you can help [place] better understand your patient needs as well as improve your experience and satisfaction. WE CARE ABOUT YOU!!!   Continue to be careful do not put yourself  at risk for falling-----use your cane more frequently Watch  your weights closely and take a Lasix 20 mg on Monday Wednesday and Friday if needed for fluid retention If you have more shortness of breath he should get back in touch with [place] or the cardiologist Watch the sodium intake Return the FOBT We will call you with the lab work results once those become available    All of the changes recommended were discussed with her daughter who had brought her to the office and she has understanding of this and will see the blood pressure readings are checked at home and will bring these back for return in 3-4 weeks. She will also keep an eye on the fluid retention and edema. Arrie Senate MD

## 2014-12-11 NOTE — Patient Instructions (Addendum)
Medicare Annual Wellness Visit  [facility] and the medical providers at [facility] strive to bring you the best medical care.  In doing so we not only want to address your current medical conditions and concerns but also to detect new conditions early and prevent illness, disease and health-related problems.    Medicare offers a yearly Wellness Visit which allows our clinical staff to assess your need for preventative services including immunizations, lifestyle education, counseling to decrease risk of preventable diseases and screening for fall risk and other medical concerns.    This visit is provided free of charge (no copay) for all Medicare recipients. The clinical pharmacists at [facility] have begun to conduct these Wellness Visits which will also include a thorough review of all your medications.    As you primary medical provider recommend that you make an appointment for your Annual Wellness Visit if you have not done so already this year.  You may set up this appointment before you leave today or you may call back ([phone]) and schedule an appointment.  Please make sure when you call that you mention that you are scheduling your Annual Wellness Visit with the clinical pharmacist so that the appointment may be made for the proper length of time.     Continue current medications. Continue good therapeutic lifestyle changes which include good diet and exercise. Fall precautions discussed with patient. If an FOBT was given today- please return it to our front desk. If you are over 79 years old - you may need Prevnar 79 or the adult Pneumonia vaccine.  Flu Shots are still available at our office. If you still haven't had one please call to set up a nurse visit to get one.   After your visit with [place] today you will receive a survey in the mail or online from [employer] regarding your care with [place]. Please take a moment to  fill this out. Your feedback is very important to [place] as you can help [place] better understand your patient needs as well as improve your experience and satisfaction. WE CARE ABOUT YOU!!!   Continue to be careful do not put yourself at risk for falling-----use your cane more frequently Watch your weights closely and take a Lasix 20 mg on Monday Wednesday and Friday if needed for fluid retention If you have more shortness of breath he should get back in touch with [place] or the cardiologist Watch the sodium intake Return the FOBT We will call you with the lab work results once those become available

## 2014-12-12 LAB — HEPATIC FUNCTION PANEL
ALT: 19 IU/L (ref 0–32)
AST: 22 IU/L (ref 0–40)
Albumin: 4.5 g/dL (ref 3.2–4.6)
Alkaline Phosphatase: 65 IU/L (ref 39–117)
BILIRUBIN, DIRECT: 0.23 mg/dL (ref 0.00–0.40)
Bilirubin Total: 0.7 mg/dL (ref 0.0–1.2)
Total Protein: 7.6 g/dL (ref 6.0–8.5)

## 2014-12-12 LAB — BMP8+EGFR
BUN/Creatinine Ratio: 26 (ref 11–26)
BUN: 23 mg/dL (ref 10–36)
CHLORIDE: 98 mmol/L (ref 97–108)
CO2: 26 mmol/L (ref 18–29)
CREATININE: 0.87 mg/dL (ref 0.57–1.00)
Calcium: 10.2 mg/dL (ref 8.7–10.3)
GFR calc Af Amer: 65 mL/min/{1.73_m2} (ref >59)
GFR, EST NON AFRICAN AMERICAN: 57 mL/min/{1.73_m2} — AB (ref >59)
GLUCOSE: 108 mg/dL — AB (ref 65–99)
POTASSIUM: 4.7 mmol/L (ref 3.5–5.2)
Sodium: 140 mmol/L (ref 134–144)

## 2014-12-12 LAB — LIPID PANEL
CHOLESTEROL TOTAL: 212 mg/dL — AB (ref 100–199)
Chol/HDL Ratio: 3.8 ratio units (ref 0.0–4.4)
HDL: 56 mg/dL (ref >39)
LDL Calculated: 142 mg/dL — ABNORMAL HIGH (ref 0–99)
TRIGLYCERIDES: 68 mg/dL (ref 0–149)
VLDL CHOLESTEROL CAL: 14 mg/dL (ref 5–40)

## 2014-12-12 LAB — VITAMIN D 25 HYDROXY (VIT D DEFICIENCY, FRACTURES): Vit D, 25-Hydroxy: 38.2 ng/mL (ref 30.0–100.0)

## 2014-12-18 ENCOUNTER — Ambulatory Visit (INDEPENDENT_AMBULATORY_CARE_PROVIDER_SITE_OTHER): Payer: Medicare Other | Admitting: Pharmacist

## 2014-12-18 DIAGNOSIS — R300 Dysuria: Secondary | ICD-10-CM | POA: Diagnosis not present

## 2014-12-18 DIAGNOSIS — I4891 Unspecified atrial fibrillation: Secondary | ICD-10-CM | POA: Diagnosis not present

## 2014-12-18 DIAGNOSIS — Z7901 Long term (current) use of anticoagulants: Secondary | ICD-10-CM | POA: Diagnosis not present

## 2014-12-18 DIAGNOSIS — I480 Paroxysmal atrial fibrillation: Secondary | ICD-10-CM

## 2014-12-18 LAB — POCT INR: INR: 2.8

## 2014-12-18 NOTE — Addendum Note (Signed)
Addended by: Pollyann Kennedy F on: 12/18/2014 01:59 PM   Modules accepted: Orders

## 2014-12-18 NOTE — Patient Instructions (Signed)
Anticoagulation Dose Instructions as of 12/18/2014      [name] Tue Wed Thu Fri Sat   New Dose 5 mg 2.5 mg 5 mg 2.5 mg 5 mg 2.5 mg 5 mg    Description        Continue warfarin 5mg  - take 1/2 tablet on mondays, wednesdays and fridays only.  Take 1 tablet all other days.       INR was 2.8 today

## 2015-01-22 ENCOUNTER — Ambulatory Visit (INDEPENDENT_AMBULATORY_CARE_PROVIDER_SITE_OTHER): Payer: Medicare Other | Admitting: Pharmacist

## 2015-01-22 DIAGNOSIS — I4891 Unspecified atrial fibrillation: Secondary | ICD-10-CM | POA: Diagnosis not present

## 2015-01-22 DIAGNOSIS — Z1212 Encounter for screening for malignant neoplasm of rectum: Secondary | ICD-10-CM | POA: Diagnosis not present

## 2015-01-22 DIAGNOSIS — Z7901 Long term (current) use of anticoagulants: Secondary | ICD-10-CM | POA: Diagnosis not present

## 2015-01-22 DIAGNOSIS — I480 Paroxysmal atrial fibrillation: Secondary | ICD-10-CM

## 2015-01-22 LAB — POCT INR: INR: 2.4

## 2015-01-22 NOTE — Patient Instructions (Signed)
Anticoagulation Dose Instructions as of 01/22/2015      [name] Tue Wed Thu Fri Sat   New Dose 5 mg 2.5 mg 5 mg 2.5 mg 5 mg 2.5 mg 5 mg    Description        Continue warfarin 5mg  - take 1/2 tablet on mondays, wednesdays and fridays only.  Take 1 tablet all other days.       INR was 2.4 today

## 2015-01-22 NOTE — Addendum Note (Signed)
Addended by: Selmer Dominion on: 01/22/2015 01:52 PM   Modules accepted: Orders

## 2015-01-24 LAB — FECAL OCCULT BLOOD, IMMUNOCHEMICAL: Fecal Occult Bld: POSITIVE — AB

## 2015-01-25 ENCOUNTER — Telehealth: Payer: Self-pay | Admitting: *Deleted

## 2015-01-25 NOTE — Telephone Encounter (Signed)
Notified pt's daughter of results Pt will return for repeat CBC and FOBT

## 2015-01-25 NOTE — Telephone Encounter (Signed)
-----   Message from Chipper Herb, MD sent at 01/24/2015  4:23 PM EDT ----- Please call the patient's daughter, Denyse Amass and let her know that the fecal occult blood test was positive and that the patient should have her FOBT repeated as well as a CBC and a couple of weeks.

## 2015-01-26 ENCOUNTER — Other Ambulatory Visit (INDEPENDENT_AMBULATORY_CARE_PROVIDER_SITE_OTHER): Payer: Medicare Other

## 2015-01-26 DIAGNOSIS — D62 Acute posthemorrhagic anemia: Secondary | ICD-10-CM

## 2015-01-26 LAB — POCT CBC
GRANULOCYTE PERCENT: 55.7 % (ref 37–80)
HEMATOCRIT: 32.5 % — AB (ref 37.7–47.9)
HEMOGLOBIN: 10.3 g/dL — AB (ref 12.2–16.2)
Lymph, poc: 1.9 (ref 0.6–3.4)
MCH, POC: 27.9 pg (ref 27–31.2)
MCHC: 31.7 g/dL — AB (ref 31.8–35.4)
MCV: 87.9 fL (ref 80–97)
MPV: 7 fL (ref 0–99.8)
PLATELET COUNT, POC: 216 10*3/uL (ref 142–424)
POC Granulocyte: 3.1 (ref 2–6.9)
POC LYMPH PERCENT: 33.7 %L (ref 10–50)
RBC: 3.69 M/uL — AB (ref 4.04–5.48)
RDW, POC: 14 %
WBC: 5.6 10*3/uL (ref 4.6–10.2)

## 2015-02-12 ENCOUNTER — Encounter: Payer: Medicare Other | Admitting: *Deleted

## 2015-02-12 ENCOUNTER — Telehealth: Payer: Self-pay | Admitting: Cardiology

## 2015-02-12 NOTE — Telephone Encounter (Signed)
Spoke with pt and reminded pt of remote transmission that is due today. Pt verbalized understanding.   

## 2015-02-13 ENCOUNTER — Encounter: Payer: Self-pay | Admitting: Cardiology

## 2015-02-19 ENCOUNTER — Ambulatory Visit (INDEPENDENT_AMBULATORY_CARE_PROVIDER_SITE_OTHER): Payer: Medicare Other

## 2015-02-19 DIAGNOSIS — I442 Atrioventricular block, complete: Secondary | ICD-10-CM

## 2015-02-19 DIAGNOSIS — Z950 Presence of cardiac pacemaker: Secondary | ICD-10-CM | POA: Diagnosis not present

## 2015-02-22 LAB — CUP PACEART REMOTE DEVICE CHECK
Battery Remaining Longevity: 152 mo
Battery Voltage: 2.98 V
Brady Statistic RV Percent Paced: 99 %
Date Time Interrogation Session: 20160725231713
Lead Channel Pacing Threshold Amplitude: 0.625 V
Lead Channel Sensing Intrinsic Amplitude: 12 mV
Lead Channel Setting Pacing Amplitude: 0.875
MDC IDC MSMT BATTERY REMAINING PERCENTAGE: 95.5 %
MDC IDC MSMT LEADCHNL RV IMPEDANCE VALUE: 410 Ohm
MDC IDC MSMT LEADCHNL RV PACING THRESHOLD PULSEWIDTH: 0.4 ms
MDC IDC PG SERIAL: 7331332
MDC IDC SET LEADCHNL RV PACING PULSEWIDTH: 0.4 ms
MDC IDC SET LEADCHNL RV SENSING SENSITIVITY: 4 mV
Pulse Gen Model: 1110

## 2015-03-05 ENCOUNTER — Other Ambulatory Visit: Payer: Self-pay | Admitting: Pharmacist

## 2015-03-05 ENCOUNTER — Ambulatory Visit (INDEPENDENT_AMBULATORY_CARE_PROVIDER_SITE_OTHER): Payer: Medicare Other | Admitting: Pharmacist

## 2015-03-05 DIAGNOSIS — Z1211 Encounter for screening for malignant neoplasm of colon: Secondary | ICD-10-CM | POA: Diagnosis not present

## 2015-03-05 DIAGNOSIS — Z1212 Encounter for screening for malignant neoplasm of rectum: Secondary | ICD-10-CM | POA: Diagnosis not present

## 2015-03-05 DIAGNOSIS — I480 Paroxysmal atrial fibrillation: Secondary | ICD-10-CM

## 2015-03-05 DIAGNOSIS — Z7901 Long term (current) use of anticoagulants: Secondary | ICD-10-CM | POA: Diagnosis not present

## 2015-03-05 DIAGNOSIS — I4891 Unspecified atrial fibrillation: Secondary | ICD-10-CM

## 2015-03-05 LAB — POCT INR: INR: 1.9

## 2015-03-05 NOTE — Patient Instructions (Signed)
Anticoagulation Dose Instructions as of 03/05/2015      [name] Tue Wed Thu Fri Sat   New Dose 5 mg 2.5 mg 5 mg 2.5 mg 5 mg 2.5 mg 5 mg    Description        Take an extra 1/2 tablet today only - Monday, August 8th, 2016, then continue warfarin 5mg  - take 1/2 tablet on mondays, wednesdays and fridays only.  Take 1 tablet all other days.       INR was 1.9 today - a little thick / too low

## 2015-03-05 NOTE — Addendum Note (Signed)
Addended by: Selmer Dominion on: 03/05/2015 12:05 PM   Modules accepted: Orders

## 2015-03-07 ENCOUNTER — Other Ambulatory Visit: Payer: Self-pay | Admitting: *Deleted

## 2015-03-07 DIAGNOSIS — R195 Other fecal abnormalities: Secondary | ICD-10-CM

## 2015-03-07 LAB — FECAL OCCULT BLOOD, IMMUNOCHEMICAL: FECAL OCCULT BLD: POSITIVE — AB

## 2015-03-13 ENCOUNTER — Encounter: Payer: Self-pay | Admitting: Cardiology

## 2015-03-22 ENCOUNTER — Encounter: Payer: Self-pay | Admitting: Internal Medicine

## 2015-04-05 ENCOUNTER — Other Ambulatory Visit: Payer: Self-pay | Admitting: Pharmacist

## 2015-04-23 ENCOUNTER — Ambulatory Visit (INDEPENDENT_AMBULATORY_CARE_PROVIDER_SITE_OTHER): Payer: Medicare Other | Admitting: Family Medicine

## 2015-04-23 ENCOUNTER — Encounter: Payer: Self-pay | Admitting: Family Medicine

## 2015-04-23 ENCOUNTER — Ambulatory Visit (INDEPENDENT_AMBULATORY_CARE_PROVIDER_SITE_OTHER): Payer: Medicare Other

## 2015-04-23 VITALS — BP 134/83 | HR 77 | Temp 97.4°F | Ht 66.0 in | Wt 136.0 lb

## 2015-04-23 DIAGNOSIS — E785 Hyperlipidemia, unspecified: Secondary | ICD-10-CM | POA: Diagnosis not present

## 2015-04-23 DIAGNOSIS — I10 Essential (primary) hypertension: Secondary | ICD-10-CM

## 2015-04-23 DIAGNOSIS — I480 Paroxysmal atrial fibrillation: Secondary | ICD-10-CM | POA: Diagnosis not present

## 2015-04-23 DIAGNOSIS — Z23 Encounter for immunization: Secondary | ICD-10-CM | POA: Diagnosis not present

## 2015-04-23 DIAGNOSIS — Z7901 Long term (current) use of anticoagulants: Secondary | ICD-10-CM

## 2015-04-23 DIAGNOSIS — E559 Vitamin D deficiency, unspecified: Secondary | ICD-10-CM

## 2015-04-23 DIAGNOSIS — I4891 Unspecified atrial fibrillation: Secondary | ICD-10-CM | POA: Diagnosis not present

## 2015-04-23 DIAGNOSIS — I442 Atrioventricular block, complete: Secondary | ICD-10-CM

## 2015-04-23 DIAGNOSIS — Z950 Presence of cardiac pacemaker: Secondary | ICD-10-CM | POA: Diagnosis not present

## 2015-04-23 LAB — POCT INR: INR: 1.8

## 2015-04-23 NOTE — Patient Instructions (Addendum)
  Anticoagulation Dose Instructions as of 04/23/2015      [name] Tue Wed Thu Fri Sat   New Dose 5 mg 2.5 mg 5 mg 2.5 mg 5 mg 2.5 mg 5 mg    Description        Take an extra 1 tablet today only - Monday, September 26th, then continue warfarin 5mg  - take 1/2 tablet on mondays, wednesdays and fridays only.  Take 1 tablet all other days.       INR was 1.8 today        Medicare Annual Wellness Visit  [facility] and the medical providers at [facility] strive to bring you the best medical care.  In doing so we not only want to address your current medical conditions and concerns but also to detect new conditions early and prevent illness, disease and health-related problems.    Medicare offers a yearly Wellness Visit which allows our clinical staff to assess your need for preventative services including immunizations, lifestyle education, counseling to decrease risk of preventable diseases and screening for fall risk and other medical concerns.    This visit is provided free of charge (no copay) for all Medicare recipients. The clinical pharmacists at [facility] have begun to conduct these Wellness Visits which will also include a thorough review of all your medications.    As you primary medical [name] recommend that you make an appointment for your Annual Wellness Visit if you have not done so already this year.  You may set up this appointment before you leave today or you may call back ([phone]) and schedule an appointment.  Please make sure when you call that you mention that you are scheduling your Annual Wellness Visit with the clinical pharmacist so that the appointment may be made for the proper length of time.     Continue current medications. Continue good therapeutic lifestyle changes which include good diet and exercise. Fall precautions discussed with patient. If an FOBT was given today- please return it to our  front desk. If you are over 25 years old - you may need Prevnar 70 or the adult Pneumonia vaccine.  **Flu shots will be available soon--- please call and schedule a FLU-CLINIC appointment**  After your visit with [place] today you will receive a survey in the mail or online from [employer] regarding your care with [place]. Please take a moment to fill this out. Your feedback is very important to [place] as you can help [place] better understand your patient needs as well as improve your experience and satisfaction. WE CARE ABOUT YOU!!!   **Please join [place] SEPT.22, 2016 from 5:00 to 7:00pm for our OPEN HOUSE! Come out and meet our NEW providers** She should follow-up with the cardiologist yearly as planned She should continue to be careful and did not put herself at risk for falling She should use her cane regularly She should consider using some in sure supplement one half to one can daily and see if this may make her feel stronger.

## 2015-04-23 NOTE — Progress Notes (Signed)
Subjective:    Patient ID: [name], female    DOB: [birth year], 81 y.o.   MRN: [id number]  HPI Pt here for follow up and management of chronic medical problems which includes atrial fibrillation, hypertension and hyperlipidemia. She is taking medications regularly. The patient's biggest complaint today is just weakness. She is due to get a chest x-ray and lab work today. She will also get a ProTime and she will get her flu shot. The weight is actually stable at 136. Previously it was 135. The patient's BMI is 21.80. She is 81 years old and is always been tall and slender and body habitus. The patient is pleasant and very alert for her age of 81 years. She says her appetite is good. She denies chest pain or shortness of breath. She has no trouble with swallowing heartburn and indigestion nausea vomiting or diarrhea. She is passing her water as usual but is incontinent. She uses a cane for walking. She sees the cardiologist yearly. She has atrial fibrillation and complete atrioventricular block. She has a permanent pacemaker in place. She uses a [employer] and wears this constantly. She is monitored closely by her daughter.      Patient Active Problem List   Diagnosis Date Noted  . Ventral hernia 12/08/2013  . Vitamin D deficiency 12/08/2013  . History of breast cancer 12/08/2013  . Long term (current) use of anticoagulants 05/05/2013  . Shortness of breath 05/02/2013  . Restless leg syndrome 10/12/2010  . CAROTID BRUIT 08/21/2010  . MITRAL REGURGITATION 08/01/2009  . HYPERTENSION, BENIGN 08/17/2008  . AV BLOCK, COMPLETE 08/17/2008  . ATRIAL FIBRILLATION 08/17/2008  . PACEMAKER, PERMANENT 08/17/2008   Outpatient Encounter Prescriptions as of 04/23/2015  Medication Sig  . b complex vitamins tablet Take 1 tablet by mouth daily.   . carbidopa-levodopa (SINEMET) 25-100 MG per tablet Take 1 tablet by mouth at bedtime.  . Cholecalciferol (VITAMIN D3) 1000 UNITS tablet Take 2,000 Units by  mouth daily.    . Fe Fum-FePoly-Vit C-Vit B3 (INTEGRA) 62.5-62.5-40-3 MG CAPS TAKE ONE CAPSULE BY MOUTH ONE TIME DAILY  . fish oil-omega-3 fatty acids 1000 MG capsule Take 2 g by mouth daily.  . furosemide (LASIX) 20 MG tablet Take 1 tablet (20 mg total) by mouth daily as needed.  Marland Kitchen losartan (COZAAR) 50 MG tablet Take 0.5 tablets (25 mg total) by mouth daily.  . polyethylene glycol powder (GLYCOLAX/MIRALAX) powder MIX 1 SCOOP (17 GRM) IN 4-6 OUNCES OF WATER, TEA, OR COFFEE AND DRINK ONCE A DAY AS NEEDED FOR CONSTIPATION  . vitamin C (ASCORBIC ACID) 500 MG tablet Take 500 mg by mouth daily.    . vitamin E 400 UNIT capsule Take 400 Units by mouth daily.  Marland Kitchen warfarin (COUMADIN) 5 MG tablet TAKE 1/2 OR 1 TABLET BY MOUTH ONCE A DAY OR AS INSTRUCTED BY COUMADIN ANTICOAGULATION CLINIC   No facility-administered encounter medications on file as of 04/23/2015.     Review of Systems  HENT: Negative.   Eyes: Negative.   Respiratory: Negative.   Cardiovascular: Negative.   Gastrointestinal: Negative.   Endocrine: Negative.   Genitourinary: Negative.   Musculoskeletal: Negative.   Skin: Negative.   Allergic/Immunologic: Negative.   Neurological: Positive for weakness.  Hematological: Negative.   Psychiatric/Behavioral: Negative.        Objective:   Physical Exam  Constitutional: She is oriented to person, place, and time. She appears well-developed and well-nourished. No distress.  Especially for her age of 81 years  HENT:  Head: Normocephalic and atraumatic.  Right Ear: External ear normal.  Left Ear: External ear normal.  Nose: Nose normal.  Mouth/Throat: Oropharynx is clear and moist.  Eyes: Conjunctivae and EOM are normal. Pupils are equal, round, and reactive to light. Right eye exhibits no discharge. Left eye exhibits no discharge. No scleral icterus.  Neck: Normal range of motion. Neck supple. No thyromegaly present.  There is a left carotid bruit which may reflect from the murmur  in her chest.  Cardiovascular: Normal rate, regular rhythm and intact distal pulses.  Exam reveals no friction rub.   No murmur heard. The rhythm is regular today at 72/m with a grade 2/6 systolic ejection murmur  Pulmonary/Chest: Effort normal and breath sounds normal. No respiratory distress. She has no wheezes. She has no rales. She exhibits no tenderness.  Clear anteriorly and posteriorly  Abdominal: Soft. Bowel sounds are normal. She exhibits no mass. There is no tenderness. There is no rebound and no guarding.  She has a ventral hernia which is tender with palpation.  Musculoskeletal: She exhibits no edema or tenderness.  The patient's range of motion is hesitant but she does use a cane for steadying her gait.  Lymphadenopathy:    She has no cervical adenopathy.  Neurological: She is alert and oriented to person, place, and time. She has normal reflexes. No cranial nerve deficit.  Skin: Skin is warm and dry. No rash noted.  Psychiatric: She has a normal mood and affect. Her behavior is normal. Judgment and thought content normal.  Nursing note and vitals reviewed.  BP 134/83 mmHg  Pulse 77  Temp(Src) 97.4 F (36.3 C) (Oral)  Ht _0  (1.676 m)  Wt 136 lb (61.689 kg)  BMI 21.96 kg/m2        Assessment & Plan:  1. Paroxysmal atrial fibrillation -The patient will continue to see the cardiologist regularly and she will continue to take her Coumadin as she is doing - CBC with Differential/Platelet  2. HYPERTENSION, BENIGN -The blood pressure is good today she will continue with current treatment - BMP8+EGFR - CBC with Differential/Platelet - Hepatic function panel - DG Chest 2 View; Future  3. Vitamin D deficiency -She will continue with her vitamin D replacement pending results of lab work - CBC with Differential/Platelet - Vit D  25 hydroxy (rtn osteoporosis monitoring)  4. Hyperlipemia -Patient will continue with therapeutic lifestyle changes - CBC with  Differential/Platelet - Lipid panel  5. Long term current use of anticoagulant therapy -She will continue with her Coumadin therapy because of her paroxysmal atrial fibrillation - POCT INR - CBC with Differential/Platelet  6. Atrial fibrillation -Continue with Coumadin therapy and follow-up with cardiology - POCT INR - CBC with Differential/Platelet  7. PACEMAKER, PERMANENT -Follow-up with cardiology  8. AV BLOCK, COMPLETE -Continue follow-up with cardiology  Patient Instructions                       Medicare Annual Wellness Visit  [facility] and the medical providers at [facility] strive to bring you the best medical care.  In doing so we not only want to address your current medical conditions and concerns but also to detect new conditions early and prevent illness, disease and health-related problems.    Medicare offers a yearly Wellness Visit which allows our clinical staff to assess your need for preventative services including immunizations, lifestyle education, counseling to decrease risk of preventable diseases and screening for  fall risk and other medical concerns.    This visit is provided free of charge (no copay) for all Medicare recipients. The clinical pharmacists at [facility] have begun to conduct these Wellness Visits which will also include a thorough review of all your medications.    As you primary medical provider recommend that you make an appointment for your Annual Wellness Visit if you have not done so already this year.  You may set up this appointment before you leave today or you may call back ([phone]) and schedule an appointment.  Please make sure when you call that you mention that you are scheduling your Annual Wellness Visit with the clinical pharmacist so that the appointment may be made for the proper length of time.     Continue current medications. Continue good therapeutic lifestyle changes which  include good diet and exercise. Fall precautions discussed with patient. If an FOBT was given today- please return it to our front desk. If you are over 63 years old - you may need Prevnar 62 or the adult Pneumonia vaccine.  **Flu shots will be available soon--- please call and schedule a FLU-CLINIC appointment**  After your visit with [place] today you will receive a survey in the mail or online from [employer] regarding your care with [place]. Please take a moment to fill this out. Your feedback is very important to [place] as you can help [place] better understand your patient needs as well as improve your experience and satisfaction. WE CARE ABOUT YOU!!!   **Please join [place] SEPT.22, 2016 from 5:00 to 7:00pm for our OPEN HOUSE! Come out and meet our NEW providers** She should follow-up with the cardiologist yearly as planned She should continue to be careful and did not put herself at risk for falling She should use her cane regularly She should consider using some in sure supplement one half to one can daily and see if this may make her feel stronger.    Arrie Senate MD

## 2015-04-24 LAB — BMP8+EGFR
BUN/Creatinine Ratio: 23 (ref 11–26)
BUN: 19 mg/dL (ref 10–36)
CALCIUM: 10.2 mg/dL (ref 8.7–10.3)
CHLORIDE: 96 mmol/L — AB (ref 97–108)
CO2: 28 mmol/L (ref 18–29)
Creatinine, Ser: 0.82 mg/dL (ref 0.57–1.00)
GFR calc Af Amer: 70 mL/min/{1.73_m2} (ref >59)
GFR calc non Af Amer: 61 mL/min/{1.73_m2} (ref >59)
GLUCOSE: 102 mg/dL — AB (ref 65–99)
POTASSIUM: 4.6 mmol/L (ref 3.5–5.2)
Sodium: 139 mmol/L (ref 134–144)

## 2015-04-24 LAB — CBC WITH DIFFERENTIAL/PLATELET
BASOS ABS: 0 10*3/uL (ref 0.0–0.2)
Basos: 1 %
EOS (ABSOLUTE): 0.1 10*3/uL (ref 0.0–0.4)
Eos: 2 %
Hematocrit: 34.2 % (ref 34.0–46.6)
Hemoglobin: 11.5 g/dL (ref 11.1–15.9)
IMMATURE GRANS (ABS): 0 10*3/uL (ref 0.0–0.1)
Immature Granulocytes: 0 %
LYMPHS: 27 %
Lymphocytes Absolute: 1.5 10*3/uL (ref 0.7–3.1)
MCH: 29.3 pg (ref 26.6–33.0)
MCHC: 33.6 g/dL (ref 31.5–35.7)
MCV: 87 fL (ref 79–97)
Monocytes Absolute: 0.8 10*3/uL (ref 0.1–0.9)
Monocytes: 13 %
NEUTROS ABS: 3.3 10*3/uL (ref 1.4–7.0)
Neutrophils: 57 %
PLATELETS: 205 10*3/uL (ref 150–379)
RBC: 3.93 x10E6/uL (ref 3.77–5.28)
RDW: 14.5 % (ref 12.3–15.4)
WBC: 5.8 10*3/uL (ref 3.4–10.8)

## 2015-04-24 LAB — LIPID PANEL
CHOLESTEROL TOTAL: 228 mg/dL — AB (ref 100–199)
Chol/HDL Ratio: 3.9 ratio units (ref 0.0–4.4)
HDL: 58 mg/dL (ref >39)
LDL Calculated: 155 mg/dL — ABNORMAL HIGH (ref 0–99)
TRIGLYCERIDES: 76 mg/dL (ref 0–149)
VLDL Cholesterol Cal: 15 mg/dL (ref 5–40)

## 2015-04-24 LAB — HEPATIC FUNCTION PANEL
ALT: 12 IU/L (ref 0–32)
AST: 22 IU/L (ref 0–40)
Albumin: 4.7 g/dL — ABNORMAL HIGH (ref 3.2–4.6)
Alkaline Phosphatase: 56 IU/L (ref 39–117)
Bilirubin Total: 1.1 mg/dL (ref 0.0–1.2)
Bilirubin, Direct: 0.3 mg/dL (ref 0.00–0.40)
Total Protein: 7.1 g/dL (ref 6.0–8.5)

## 2015-04-24 LAB — VITAMIN D 25 HYDROXY (VIT D DEFICIENCY, FRACTURES): Vit D, 25-Hydroxy: 34.5 ng/mL (ref 30.0–100.0)

## 2015-04-26 ENCOUNTER — Encounter: Payer: Self-pay | Admitting: Family Medicine

## 2015-04-27 ENCOUNTER — Other Ambulatory Visit: Payer: Medicare Other

## 2015-04-27 ENCOUNTER — Telehealth: Payer: Self-pay | Admitting: Family Medicine

## 2015-04-27 DIAGNOSIS — R350 Frequency of micturition: Secondary | ICD-10-CM

## 2015-04-27 DIAGNOSIS — R300 Dysuria: Secondary | ICD-10-CM | POA: Diagnosis not present

## 2015-04-27 MED ORDER — CEPHALEXIN 500 MG PO CAPS: 500.0000 mg | ORAL_CAPSULE | Freq: Three times a day (TID) | ORAL | Status: DC

## 2015-04-27 NOTE — Telephone Encounter (Signed)
Awaiting results. Will plan to treat accordingly.   Laroy Apple, MD [facility] 04/27/2015, 2:09 PM

## 2015-04-27 NOTE — Telephone Encounter (Signed)
Patient dropped off urine but it was sent for culture instead of UA + culture.   She has dysuria and frequency. Given her age I think it most prudent to Treat and await culture. Kelfex, adjust if needed per culture.   Laroy Apple, MD [facility] 04/27/2015, 5:17 PM

## 2015-04-27 NOTE — Progress Notes (Signed)
Lab only 

## 2015-04-27 NOTE — Telephone Encounter (Signed)
Pt is 81 yo, daughter thinks she has another UTI. Pt just saw Dr. Laurance Flatten on Monday. Order placed. Please call daughter [name] at [phone] with the results.

## 2015-04-30 LAB — URINE CULTURE

## 2015-05-10 ENCOUNTER — Other Ambulatory Visit: Payer: Medicare Other

## 2015-05-10 DIAGNOSIS — K921 Melena: Secondary | ICD-10-CM | POA: Diagnosis not present

## 2015-05-10 DIAGNOSIS — I4891 Unspecified atrial fibrillation: Secondary | ICD-10-CM | POA: Diagnosis not present

## 2015-05-10 DIAGNOSIS — R300 Dysuria: Secondary | ICD-10-CM | POA: Diagnosis not present

## 2015-05-10 DIAGNOSIS — Z7901 Long term (current) use of anticoagulants: Secondary | ICD-10-CM | POA: Diagnosis not present

## 2015-05-12 LAB — URINE CULTURE

## 2015-05-21 ENCOUNTER — Ambulatory Visit (INDEPENDENT_AMBULATORY_CARE_PROVIDER_SITE_OTHER): Payer: Medicare Other | Admitting: *Deleted

## 2015-05-21 ENCOUNTER — Telehealth: Payer: Self-pay | Admitting: Cardiology

## 2015-05-21 DIAGNOSIS — I442 Atrioventricular block, complete: Secondary | ICD-10-CM | POA: Diagnosis not present

## 2015-05-21 DIAGNOSIS — I4891 Unspecified atrial fibrillation: Secondary | ICD-10-CM | POA: Diagnosis not present

## 2015-05-21 NOTE — Telephone Encounter (Signed)
Spoke with pt and reminded pt of remote transmission that is due today. Pt verbalized understanding.   

## 2015-05-22 ENCOUNTER — Other Ambulatory Visit: Payer: Self-pay | Admitting: Family Medicine

## 2015-05-22 NOTE — Progress Notes (Signed)
LOOP RECORDER  

## 2015-05-23 NOTE — Progress Notes (Signed)
Error below -- Remote pacemaker transmission.

## 2015-05-25 ENCOUNTER — Encounter: Payer: Self-pay | Admitting: Cardiology

## 2015-05-25 LAB — CUP PACEART REMOTE DEVICE CHECK
Implantable Lead Location: 753860
Lead Channel Pacing Threshold Amplitude: 0.625 V
Lead Channel Pacing Threshold Pulse Width: 0.4 ms
Lead Channel Setting Pacing Amplitude: 0.875
Lead Channel Setting Pacing Pulse Width: 0.4 ms
Lead Channel Setting Sensing Sensitivity: 4 mV
MDC IDC LEAD IMPLANT DT: 20020418
MDC IDC MSMT BATTERY REMAINING LONGEVITY: 153 mo
MDC IDC MSMT BATTERY REMAINING PERCENTAGE: 95.5 %
MDC IDC MSMT BATTERY VOLTAGE: 2.98 V
MDC IDC MSMT LEADCHNL RV IMPEDANCE VALUE: 440 Ohm
MDC IDC MSMT LEADCHNL RV SENSING INTR AMPL: 12 mV
MDC IDC PG SERIAL: 7331332
MDC IDC SESS DTM: 20161024200611
MDC IDC STAT BRADY RV PERCENT PACED: 99 %
Pulse Gen Model: 1110

## 2015-06-01 DIAGNOSIS — K921 Melena: Secondary | ICD-10-CM | POA: Diagnosis not present

## 2015-06-04 ENCOUNTER — Ambulatory Visit (INDEPENDENT_AMBULATORY_CARE_PROVIDER_SITE_OTHER): Payer: Medicare Other | Admitting: Pharmacist

## 2015-06-04 DIAGNOSIS — I4891 Unspecified atrial fibrillation: Secondary | ICD-10-CM | POA: Diagnosis not present

## 2015-06-04 DIAGNOSIS — Z7901 Long term (current) use of anticoagulants: Secondary | ICD-10-CM | POA: Diagnosis not present

## 2015-06-04 LAB — POCT INR: INR: 2.1

## 2015-06-04 NOTE — Patient Instructions (Signed)
Anticoagulation Dose Instructions as of 06/04/2015      [name] Tue Wed Thu Fri Sat   New Dose 5 mg 2.5 mg 5 mg 2.5 mg 5 mg 2.5 mg 5 mg    Description        Continue warfarin 5mg  - take 1/2 tablet on mondays, wednesdays and fridays only.  Take 1 tablet all other days.

## 2015-07-05 ENCOUNTER — Ambulatory Visit (INDEPENDENT_AMBULATORY_CARE_PROVIDER_SITE_OTHER): Payer: Medicare Other | Admitting: Pharmacist

## 2015-07-05 ENCOUNTER — Encounter: Payer: Self-pay | Admitting: Pharmacist

## 2015-07-05 VITALS — Ht 64.75 in | Wt 138.0 lb

## 2015-07-05 DIAGNOSIS — I4891 Unspecified atrial fibrillation: Secondary | ICD-10-CM

## 2015-07-05 DIAGNOSIS — Z7901 Long term (current) use of anticoagulants: Secondary | ICD-10-CM

## 2015-07-05 LAB — POCT INR: INR: 2.2

## 2015-07-05 NOTE — Progress Notes (Signed)
Subjective:     Indication: atrial fibrillation Bleeding signs/symptoms: None Thromboembolic signs/symptoms: None  Missed Coumadin doses: None Medication changes: no Dietary changes: no Bacterial/viral infection: no Other concerns: yes - there is a recommendation to review patient's dietary intake due to low BMI (last BMI was 21.95)   However I checked patient's height and was lower than past recorded height. Asked patient about drinking nutritional supplements such as Ensure and she states that she does not like them.  Also some of these nutritional supplements can contain vitamin K which could affect INR.    The following portions of the patient's history were reviewed and updated as appropriate: allergies, current medications, past family history, past medical history, past social history, past surgical history and problem list.  Review of Systems Constitutional: negative for anorexia, chills, fatigue, fevers, malaise, night sweats, sweats and weight loss Gastrointestinal: positive for constipation, negative for abdominal pain, change in bowel habits, diarrhea, dyspepsia, dysphagia, jaundice, melena, nausea, odynophagia, reflux symptoms and vomiting   Objective:    INR Today: 2.2 Current dose: warfarin 5mg  daily except 2.5mg  on mondays, wednesdays and fridays.  Weight today was 138# Height was 5' 4.75" Body mass index is 23.13 kg/(m^2).       Assessment:    Therapeutic INR for goal of 2-3  Low BMI - at goal today.   Plan:    1. New dose: no change   2. Next INR: 6 weeks. 3.  Discussed healthy eating habits and goal BMI.  Continue to monitor weight.      Cherre Robins, PharmD, CPP

## 2015-07-05 NOTE — Patient Instructions (Signed)
Anticoagulation Dose Instructions as of 07/05/2015      [name] Tue Wed Thu Fri Sat   New Dose 5 mg 2.5 mg 5 mg 2.5 mg 5 mg 2.5 mg 5 mg    Description        Continue warfarin 5mg  - take 1/2 tablet on mondays, wednesdays and fridays only.  Take 1 tablet all other days.       INR was 2.2 today

## 2015-08-23 ENCOUNTER — Ambulatory Visit (INDEPENDENT_AMBULATORY_CARE_PROVIDER_SITE_OTHER): Payer: Medicare Other

## 2015-08-23 ENCOUNTER — Ambulatory Visit (INDEPENDENT_AMBULATORY_CARE_PROVIDER_SITE_OTHER): Payer: Medicare Other | Admitting: Family Medicine

## 2015-08-23 ENCOUNTER — Encounter: Payer: Self-pay | Admitting: Family Medicine

## 2015-08-23 VITALS — BP 124/70 | HR 72 | Temp 97.4°F | Ht 64.75 in | Wt 133.0 lb

## 2015-08-23 DIAGNOSIS — I4891 Unspecified atrial fibrillation: Secondary | ICD-10-CM

## 2015-08-23 DIAGNOSIS — Z7901 Long term (current) use of anticoagulants: Secondary | ICD-10-CM

## 2015-08-23 DIAGNOSIS — I480 Paroxysmal atrial fibrillation: Secondary | ICD-10-CM

## 2015-08-23 DIAGNOSIS — R0602 Shortness of breath: Secondary | ICD-10-CM

## 2015-08-23 DIAGNOSIS — R350 Frequency of micturition: Secondary | ICD-10-CM

## 2015-08-23 DIAGNOSIS — E559 Vitamin D deficiency, unspecified: Secondary | ICD-10-CM

## 2015-08-23 DIAGNOSIS — I10 Essential (primary) hypertension: Secondary | ICD-10-CM | POA: Diagnosis not present

## 2015-08-23 DIAGNOSIS — E785 Hyperlipidemia, unspecified: Secondary | ICD-10-CM

## 2015-08-23 LAB — POCT UA - MICROSCOPIC ONLY
BACTERIA, U MICROSCOPIC: NEGATIVE
CASTS, UR, LPF, POC: NEGATIVE
Crystals, Ur, HPF, POC: NEGATIVE
MUCUS UA: NEGATIVE
WBC, Ur, HPF, POC: NEGATIVE
YEAST UA: NEGATIVE

## 2015-08-23 LAB — POCT URINALYSIS DIPSTICK
BILIRUBIN UA: NEGATIVE
GLUCOSE UA: NEGATIVE
KETONES UA: NEGATIVE
Leukocytes, UA: NEGATIVE
NITRITE UA: NEGATIVE
Protein, UA: NEGATIVE
Spec Grav, UA: 1.01
Urobilinogen, UA: NEGATIVE
pH, UA: 6.5

## 2015-08-23 LAB — POCT INR: INR: 1.8

## 2015-08-23 NOTE — Patient Instructions (Addendum)
Anticoagulation Dose Instructions as of 08/23/2015      [name] Tue Wed Thu Fri Sat   New Dose 5 mg 2.5 mg 5 mg 2.5 mg 5 mg 2.5 mg 5 mg    Description        Take 1 and 1/2 tablets today (Thrusdays, January 27th).  Then continue warfarin 5mg  - take 1/2 tablet on mondays, wednesdays and fridays only.  Take 1 tablet on sundays, tuesdays, thursdays and saturdays.        INR was 1.8 today                      Medicare Annual Wellness Visit  [facility] and the medical providers at [facility] strive to bring you the best medical care.  In doing so we not only want to address your current medical conditions and concerns but also to detect new conditions early and prevent illness, disease and health-related problems.    Medicare offers a yearly Wellness Visit which allows our clinical staff to assess your need for preventative services including immunizations, lifestyle education, counseling to decrease risk of preventable diseases and screening for fall risk and other medical concerns.    This visit is provided free of charge (no copay) for all Medicare recipients. The clinical pharmacists at [facility] have begun to conduct these Wellness Visits which will also include a thorough review of all your medications.    As you primary medical provider recommend that you make an appointment for your Annual Wellness Visit if you have not done so already this year.  You may set up this appointment before you leave today or you may call back [phone] and schedule an appointment.  Please make sure when you call that you mention that you are scheduling your Annual Wellness Visit with the clinical pharmacist so that the appointment may be made for the proper length of time.     Continue current medications. Continue good therapeutic lifestyle changes which include good diet and exercise. Fall precautions discussed with patient. If an FOBT was given today-  please return it to our front desk. If you are over 10 years old - you may need Prevnar 62 or the adult Pneumonia vaccine.  After your visit with [place] today you will receive a survey in the mail or online from [employer] regarding your care with [place]. Please take a moment to fill this out. Your feedback is very important to [place] as you can help [place] better understand your patient needs as well as improve your experience and satisfaction. WE CARE ABOUT YOU!!!   The patient should take Lasix 20 one half on Monday Wednesday and Friday She should have a BMP repeated in a couple of weeks She should continue to take the Coumadin as directed by the clinical pharmacist We will call with the lab work results as soon as they become available She should use nasal saline at home for nasal congestion and Mucinex if needed for cough and congestion and drink plenty of fluids and keep the house as cool as possible

## 2015-08-23 NOTE — Progress Notes (Addendum)
Subjective:    Patient ID: [name], female    DOB: June 04, 1917, 81 y.o.   MRN: [id number]  HPI Pt here for follow up and management of chronic medical problems which includes a fib and hyperlipidemia.She is taking most medications regularly. She is accompanied today by her daughter. The patient has not been taking her fluid pill regularly. This includes her Lasix. She is taking her low [place]. The shortness of breath is been going on for about a week. She notices it at nighttime and during the day. She did take a fluid pill 20 mg today. She does not take this regularly. She denies any chest pain. She is swallowing without problems and has no heartburn indigestion nausea vomiting diarrhea or bright red blood in her stool. She is passing her water frequently especially when she takes a fluid pill. She is not having any symptoms with voiding.     Patient Active Problem List   Diagnosis Date Noted  . Ventral hernia 12/08/2013  . Vitamin D deficiency 12/08/2013  . History of breast cancer 12/08/2013  . Long term (current) use of anticoagulants 05/05/2013  . Shortness of breath 05/02/2013  . Restless leg syndrome 10/12/2010  . CAROTID BRUIT 08/21/2010  . MITRAL REGURGITATION 08/01/2009  . HYPERTENSION, BENIGN 08/17/2008  . AV BLOCK, COMPLETE 08/17/2008  . ATRIAL FIBRILLATION 08/17/2008  . PACEMAKER, PERMANENT 08/17/2008   Outpatient Encounter Prescriptions as of 08/23/2015  Medication Sig  . b complex vitamins tablet Take 1 tablet by mouth daily.   . Cholecalciferol (VITAMIN D3) 1000 UNITS tablet Take 2,000 Units by mouth daily.    . Fe Fum-FePoly-Vit C-Vit B3 (INTEGRA) 62.5-62.5-40-3 MG CAPS TAKE ONE CAPSULE BY MOUTH ONE TIME DAILY  . fish oil-omega-3 fatty acids 1000 MG capsule Take 2 g by mouth daily.  . furosemide (LASIX) 20 MG tablet Take 1 tablet (20 mg total) by mouth daily as needed.  Marland Kitchen losartan (COZAAR) 50 MG tablet Take 0.5 tablets (25 mg total) by mouth daily.  .  polyethylene glycol powder (GLYCOLAX/MIRALAX) powder MIX 1 SCOOP (17 GRM) IN 4-6 OUNCES OF WATER, TEA, OR COFFEE AND DRINK ONCE A DAY AS NEEDED FOR CONSTIPATION  . vitamin C (ASCORBIC ACID) 500 MG tablet Take 500 mg by mouth daily.    . vitamin E 400 UNIT capsule Take 400 Units by mouth daily.  Marland Kitchen warfarin (COUMADIN) 5 MG tablet TAKE 1/2 OR 1 TABLET BY MOUTH ONCE A DAY OR AS INSTRUCTED BY COUMADIN ANTICOAGULATION CLINIC  . [DISCONTINUED] carbidopa-levodopa (SINEMET) 25-100 MG per tablet Take 1 tablet by mouth at bedtime.   No facility-administered encounter medications on file as of 08/23/2015.     Review of Systems  Constitutional: Negative.   HENT: Negative.   Eyes: Negative.   Respiratory: Positive for shortness of breath.   Cardiovascular: Negative.   Gastrointestinal: Negative.   Endocrine: Negative.   Genitourinary: Negative.   Musculoskeletal: Negative.   Skin: Negative.   Allergic/Immunologic: Negative.   Neurological: Negative.   Hematological: Negative.   Psychiatric/Behavioral: Negative.        Objective:   Physical Exam  Constitutional: She is oriented to person, place, and time. She appears well-developed and well-nourished. No distress.  HENT:  Head: Normocephalic and atraumatic.  Right Ear: External ear normal.  Left Ear: External ear normal.  Nose: Nose normal.  Mouth/Throat: Oropharynx is clear and moist.  Eyes: Conjunctivae and EOM are normal. Pupils are equal, round, and reactive to light. Right eye exhibits no  discharge. Left eye exhibits no discharge. No scleral icterus.  Neck: Normal range of motion. Neck supple. No thyromegaly present.  Cardiovascular: Normal rate, regular rhythm and normal heart sounds.   No murmur heard. Heart has a regular rate and rhythm at 72/m.  Pulmonary/Chest: Effort normal and breath sounds normal. No respiratory distress. She has no wheezes. She has no rales. She exhibits no tenderness.  No rales or rhonchi. Lungs are clear  anteriorly and posteriorly  Abdominal: Soft. Bowel sounds are normal. She exhibits no mass. There is no tenderness. There is no rebound and no guarding.  Nontender abdomen. No presacral edema.  Musculoskeletal: She exhibits no edema or tenderness.  Somewhat unsteady on her feet but walks well with assistance  Lymphadenopathy:    She has no cervical adenopathy.  Neurological: She is alert and oriented to person, place, and time. No cranial nerve deficit.  Skin: Skin is warm and dry. No rash noted.  Psychiatric: She has a normal mood and affect. Her behavior is normal. Judgment and thought content normal.  Nursing note and vitals reviewed.  BP 124/70 mmHg  Pulse 72  Temp(Src) 97.4 F (36.3 C) (Oral)  Ht 5' 4.75" (1.645 m)  Wt 133 lb (60.328 kg)  BMI 22.29 kg/m2  SpO2 99%   [facility] reading (PRIMARY) by  Dr. Brunilda Payor x-ray--                                       Assessment & Plan:  1. Long term current use of anticoagulant therapy -Continue Coumadin as directed - POCT INR  2. Atrial fibrillation ([facility]) -Follow-up with cardiology as planned - POCT INR  3. HYPERTENSION, BENIGN -Blood pressure is good today and no change in treatment - BMP8+EGFR - Hepatic function panel - Brain natriuretic peptide  4. Hyperlipemia -Patient will continue with her omega-3 fatty acids and with diet and exercise as tolerated - Lipid panel  5. Vitamin D deficiency -Continue vitamin D as doing until lab work is returned - VITAMIN D 25 Hydroxy (Vit-D Deficiency, Fractures)  6. Long term (current) use of anticoagulants  7. Paroxysmal atrial fibrillation ([facility]) -Continue follow-up with cardiology  8. Shortness of breath -Take Lasix 20 mg one half on Monday Wednesday and Friday and recheck BMP in 2 weeks - Brain natriuretic peptide - DG Chest 2 View; Future  9. Urine frequency -Urinalysis and urine culture and sensitivity pending results of urinalysis - POCT UA - Microscopic Only - POCT  urinalysis dipstick - Urine culture  Patient Instructions   Anticoagulation Dose Instructions as of 08/23/2015      Dorene Grebe Tue Wed Thu Fri Sat   New Dose 5 mg 2.5 mg 5 mg 2.5 mg 5 mg 2.5 mg 5 mg    Description        Take 1 and 1/2 tablets today (Thrusdays, January 27th).  Then continue warfarin 98m - take 1/2 tablet on mondays, wednesdays and fridays only.  Take 1 tablet on sundays, tuesdays, thursdays and saturdays.        INR was 1.8 today                      Medicare Annual Wellness Visit  [facility].  In doing so we not only want to address your current medical  conditions and concerns but also to detect new conditions early and prevent illness, disease and health-related problems.    Medicare offers a yearly Wellness Visit which allows our clinical staff to assess your need for preventative services including immunizations, lifestyle education, counseling to decrease risk of preventable diseases and screening for fall risk and other medical concerns.    This visit is provided free of charge (no copay) for all Medicare recipients. The clinical pharmacists at [facility] have begun to conduct these Wellness Visits which will also include a thorough review of all your medications.    As you primary medical provider recommend that you make an appointment for your Annual Wellness Visit if you have not done so already this year.  You may set up this appointment before you leave today or you may call back [phone]) and schedule an appointment.  Please make sure when you call that you mention that you are scheduling your Annual Wellness Visit with the clinical pharmacist so that the appointment may be made for the proper length of time.     Continue current medications. Continue good therapeutic lifestyle changes which include good diet and exercise. Fall  precautions discussed with patient. If an FOBT was given today- please return it to our front desk. If you are over 57 years old - you may need Prevnar 49 or the adult Pneumonia vaccine.  After your visit with [place] today you will receive a survey in the mail or online from [employer] regarding your care with [place]. Please take a moment to fill this out. Your feedback is very important to [place] as you can help [place] better understand your patient needs as well as improve your experience and satisfaction. WE CARE ABOUT YOU!!!   The patient should take Lasix 20 one half on Monday Wednesday and Friday She should have a BMP repeated in a couple of weeks She should continue to take the Coumadin as directed by the clinical pharmacist We will call with the lab work results as soon as they become available She should use nasal saline at home for nasal congestion and Mucinex if needed for cough and congestion and drink plenty of fluids and keep the house as cool as possible   Arrie Senate MD

## 2015-08-24 LAB — VITAMIN D 25 HYDROXY (VIT D DEFICIENCY, FRACTURES): Vit D, 25-Hydroxy: 40.5 ng/mL (ref 30.0–100.0)

## 2015-08-24 LAB — LIPID PANEL
CHOL/HDL RATIO: 4.1 ratio (ref 0.0–4.4)
CHOLESTEROL TOTAL: 212 mg/dL — AB (ref 100–199)
HDL: 52 mg/dL (ref >39)
LDL Calculated: 146 mg/dL — ABNORMAL HIGH (ref 0–99)
TRIGLYCERIDES: 72 mg/dL (ref 0–149)
VLDL Cholesterol Cal: 14 mg/dL (ref 5–40)

## 2015-08-24 LAB — HEPATIC FUNCTION PANEL
ALK PHOS: 70 IU/L (ref 39–117)
ALT: 16 IU/L (ref 0–32)
AST: 26 IU/L (ref 0–40)
Albumin: 4.6 g/dL (ref 3.2–4.6)
BILIRUBIN TOTAL: 1.4 mg/dL — AB (ref 0.0–1.2)
BILIRUBIN, DIRECT: 0.41 mg/dL — AB (ref 0.00–0.40)
TOTAL PROTEIN: 7.2 g/dL (ref 6.0–8.5)

## 2015-08-24 LAB — BMP8+EGFR
BUN / CREAT RATIO: 26 (ref 11–26)
BUN: 19 mg/dL (ref 10–36)
CALCIUM: 9.8 mg/dL (ref 8.7–10.3)
CO2: 26 mmol/L (ref 18–29)
CREATININE: 0.74 mg/dL (ref 0.57–1.00)
Chloride: 94 mmol/L — ABNORMAL LOW (ref 96–106)
GFR calc Af Amer: 79 mL/min/{1.73_m2} (ref >59)
GFR calc non Af Amer: 69 mL/min/{1.73_m2} (ref >59)
GLUCOSE: 93 mg/dL (ref 65–99)
Potassium: 4.7 mmol/L (ref 3.5–5.2)
Sodium: 139 mmol/L (ref 134–144)

## 2015-08-24 LAB — BRAIN NATRIURETIC PEPTIDE: BNP: 167.3 pg/mL — ABNORMAL HIGH (ref 0.0–100.0)

## 2015-08-25 LAB — URINE CULTURE

## 2015-08-27 ENCOUNTER — Other Ambulatory Visit: Payer: Self-pay | Admitting: *Deleted

## 2015-08-27 DIAGNOSIS — R399 Unspecified symptoms and signs involving the genitourinary system: Secondary | ICD-10-CM

## 2015-08-28 ENCOUNTER — Ambulatory Visit: Payer: Medicare Other | Admitting: Family Medicine

## 2015-08-30 ENCOUNTER — Ambulatory Visit (INDEPENDENT_AMBULATORY_CARE_PROVIDER_SITE_OTHER): Payer: Medicare Other | Admitting: Internal Medicine

## 2015-08-30 ENCOUNTER — Encounter: Payer: Self-pay | Admitting: Internal Medicine

## 2015-08-30 VITALS — BP 120/79 | HR 62 | Wt 135.0 lb

## 2015-08-30 DIAGNOSIS — I442 Atrioventricular block, complete: Secondary | ICD-10-CM

## 2015-08-30 DIAGNOSIS — Z950 Presence of cardiac pacemaker: Secondary | ICD-10-CM | POA: Diagnosis not present

## 2015-08-30 DIAGNOSIS — I482 Chronic atrial fibrillation, unspecified: Secondary | ICD-10-CM

## 2015-08-30 LAB — CUP PACEART INCLINIC DEVICE CHECK
Battery Remaining Longevity: 141.6
Battery Voltage: 2.96 V
Brady Statistic RV Percent Paced: 99.95 %
Date Time Interrogation Session: 20170202144904
Implantable Lead Implant Date: 20020418
Implantable Lead Location: 753860
Implantable Lead Model: 5076
Lead Channel Setting Pacing Amplitude: 0.875
MDC IDC MSMT LEADCHNL RV IMPEDANCE VALUE: 450 Ohm
MDC IDC MSMT LEADCHNL RV PACING THRESHOLD AMPLITUDE: 0.625 V
MDC IDC MSMT LEADCHNL RV PACING THRESHOLD PULSEWIDTH: 0.4 ms
MDC IDC PG SERIAL: 7331332
MDC IDC SET LEADCHNL RV PACING PULSEWIDTH: 0.4 ms
MDC IDC SET LEADCHNL RV SENSING SENSITIVITY: 4 mV

## 2015-08-30 NOTE — Progress Notes (Signed)
Patient Care Team: Chipper Herb, MD as PCP - General (Family Medicine) Minus Breeding, MD as Consulting Physician (Cardiology) Deboraha Sprang, MD as Consulting Physician (Cardiology) Garald Balding, MD as Consulting Physician (Orthopedic Surgery) Williams Che, MD as Consulting Physician (Ophthalmology)   HPI  [name] is a 82 y.o. female Seen in followup for complete heart block. She status post pacemaker and underwent generator replacement 12/13 at [facility].  She has a history of atrial fibrillation-permanent and status post AV junction ablation.  She has known moderate aortic stenosis. she has severe biatrial enlargement and moderate to severe tricuspid regurgitation by echo 2/14.  She denies significant shortness of breath. There has been no peripheral edema or palpitations.    Past Medical History  Diagnosis Date  . Atrial fibrillation ([place])   . Hypertension   . Heart failure, diastolic ([place])   . Carotid bruit   . S/P AV nodal ablation   . Ovarian cyst   . Colon disorder     Adhesions  . QT prolongation     With solatol  . Nephrolithiasis   . Mitral regurgitation     Moderate  . Hyperlipidemia     statin intolerant  . Restless leg syndrome   . Cataract     Past Surgical History  Procedure Laterality Date  . Pacemaker insertion      St. Jude  . Cystectomy      Ovarian  . Abdominal adhesion surgery      twice  . Cataract extraction      Bilaterally  . Colon surgery      Colonic obstruction secondary to diverticulitis with resection  . Joint replacement Right 2004    hip  . Breast surgery Left 2010    lumpectomy  . Permanent pacemaker generator change N/A 07/22/2012    Procedure: PERMANENT PACEMAKER GENERATOR CHANGE;  Surgeon: Deboraha Sprang, MD;  Location: [facility] CATH LAB;  Service: Cardiovascular;  Laterality: N/A;  . Eye surgery      Current Outpatient Prescriptions  Medication Sig Dispense Refill  . b complex vitamins tablet Take 1 tablet by  mouth daily.     . Cholecalciferol (VITAMIN D3) 1000 UNITS tablet Take 2,000 Units by mouth daily.      . Fe Fum-FePoly-Vit C-Vit B3 (INTEGRA) 62.5-62.5-40-3 MG CAPS TAKE ONE CAPSULE BY MOUTH ONE TIME DAILY 90 capsule 3  . fish oil-omega-3 fatty acids 1000 MG capsule Take 2 g by mouth daily.    . furosemide (LASIX) 20 MG tablet Take 1 tablet (20 mg total) by mouth daily as needed. 90 tablet 3  . losartan (COZAAR) 50 MG tablet Take 0.5 tablets (25 mg total) by mouth daily. 45 tablet 6  . polyethylene glycol powder (GLYCOLAX/MIRALAX) powder MIX 1 SCOOP (17 GRM) IN 4-6 OUNCES OF WATER, TEA, OR COFFEE AND DRINK ONCE A DAY AS NEEDED FOR CONSTIPATION 1581 g 0  . vitamin C (ASCORBIC ACID) 500 MG tablet Take 500 mg by mouth daily.      . vitamin E 400 UNIT capsule Take 400 Units by mouth daily.    Marland Kitchen warfarin (COUMADIN) 5 MG tablet TAKE 1/2 OR 1 TABLET BY MOUTH ONCE A DAY OR AS INSTRUCTED BY COUMADIN ANTICOAGULATION CLINIC 90 tablet 3   No current facility-administered medications for this visit.    Allergies  Allergen Reactions  . Atorvastatin     unknown  . Boniva [Ibandronate Sodium]     unknown  . Chocolate  unknown  . Lovastatin     unknown  . Pravastatin Sodium     unknown  . Risedronate Sodium     unknown  . Toprol Xl [Metoprolol Succinate]     unknown  . Visken [Pindolol]     unknown  . Welchol [Colesevelam Hcl]     unknown    Review of Systems negative except from HPI and PMH  Physical Exam There were no vitals taken for this visit. Well developed and well nourished in no acute distress HENT normal E scleral and icterus clear Neck Supple JVP flat; carotids brisk and full RRR  3/ 6 murmur>>apex no murmurs gallops or rub Soft with active bowel sounds No clubbing cyanosis no  Edema Alert and oriented, grossly normal motor and sensory function Skin Warm and Dry  ECG demonstrates atrial fibrillation with underlying ventricular pacing   Assessment and  Plan  Atrial  fibrillation-permanent   Pacemaker-St. Jude   Complete heart block   She remains on anticoagulation with warfarin. Pacemaker function is normal.

## 2015-08-30 NOTE — Patient Instructions (Signed)
Medication Instructions: - Your physician recommends that you continue on your current medications as directed. Please refer to the Current Medication list given to you today.  Labwork: - none  Procedures/Testing: - none  Follow-Up: - Remote monitoring is used to monitor your Pacemaker of ICD from home. This monitoring reduces the number of office visits required to check your device to one time per year. It allows [place] to keep an eye on the functioning of your device to ensure it is working properly. You are scheduled for a device check from home on 11/29/15. You may send your transmission at any time that day. If you have a wireless device, the transmission will be sent automatically. After your physician reviews your transmission, you will receive a postcard with your next transmission date.  - Your physician wants you to follow-up in: 1 year with Dr. Caryl Comes. You will receive a reminder letter in the mail two months in advance. If you don't receive a letter, please call our office to schedule the follow-up appointment.   Any Additional Special Instructions Will Be Listed Below (If Applicable).     If you need a refill on your cardiac medications before your next appointment, please call your pharmacy.

## 2015-09-05 ENCOUNTER — Other Ambulatory Visit (INDEPENDENT_AMBULATORY_CARE_PROVIDER_SITE_OTHER): Payer: Medicare Other

## 2015-09-05 DIAGNOSIS — R399 Unspecified symptoms and signs involving the genitourinary system: Secondary | ICD-10-CM | POA: Diagnosis not present

## 2015-09-05 LAB — POCT URINALYSIS DIPSTICK
BILIRUBIN UA: NEGATIVE
Blood, UA: NEGATIVE
Glucose, UA: NEGATIVE
KETONES UA: NEGATIVE
LEUKOCYTES UA: NEGATIVE
NITRITE UA: NEGATIVE
PROTEIN UA: NEGATIVE
Spec Grav, UA: <=1.005
Urobilinogen, UA: NEGATIVE
pH, UA: 7.5

## 2015-09-05 LAB — POCT UA - MICROSCOPIC ONLY
Bacteria, U Microscopic: NEGATIVE
Casts, Ur, LPF, POC: NEGATIVE
Crystals, Ur, HPF, POC: NEGATIVE
MUCUS UA: NEGATIVE
RBC, URINE, MICROSCOPIC: NEGATIVE
WBC, UR, HPF, POC: NEGATIVE
Yeast, UA: NEGATIVE

## 2015-09-05 NOTE — Progress Notes (Signed)
Lab only 

## 2015-09-07 LAB — URINE CULTURE: Organism ID, Bacteria: NO GROWTH

## 2015-09-12 ENCOUNTER — Other Ambulatory Visit: Payer: Self-pay | Admitting: Family Medicine

## 2015-09-12 MED ORDER — POLYETHYLENE GLYCOL 3350 17 GM/SCOOP PO POWD: ORAL | Status: DC

## 2015-09-12 NOTE — Telephone Encounter (Signed)
done

## 2015-09-26 ENCOUNTER — Telehealth: Payer: Self-pay | Admitting: Family Medicine

## 2015-09-26 NOTE — Telephone Encounter (Signed)
Reviewed with patient when next appt is scheduled.  She understands.

## 2015-10-04 ENCOUNTER — Encounter: Payer: Self-pay | Admitting: Pharmacist

## 2015-10-04 ENCOUNTER — Ambulatory Visit (INDEPENDENT_AMBULATORY_CARE_PROVIDER_SITE_OTHER): Payer: Medicare Other | Admitting: Pharmacist

## 2015-10-04 VITALS — BP 128/76 | HR 62 | Ht 65.0 in | Wt 138.0 lb

## 2015-10-04 DIAGNOSIS — I482 Chronic atrial fibrillation, unspecified: Secondary | ICD-10-CM

## 2015-10-04 DIAGNOSIS — Z0000 Encounter for general adult medical examination without abnormal findings: Secondary | ICD-10-CM | POA: Diagnosis not present

## 2015-10-04 DIAGNOSIS — Z7901 Long term (current) use of anticoagulants: Secondary | ICD-10-CM | POA: Diagnosis not present

## 2015-10-04 LAB — COAGUCHEK XS/INR WAIVED
INR: 1.9 — AB (ref 0.9–1.1)
PROTHROMBIN TIME: 23.1 s

## 2015-10-04 NOTE — Progress Notes (Signed)
Patient ID: [name], female   DOB: [birth year], 82 y.o.   MRN: [id number]    Subjective:   [name] is a 82 y.o. white, female who presents for a Subsequentl Medicare Annual Wellness Visit and to recheck INR.  [name] is pleasant and alert and oriented.  She does not have any health related complaints today.   Review of Systems  Review of Systems  Constitutional: Negative.   HENT: Negative.   Eyes: Negative.   Respiratory: Negative.   Cardiovascular: Negative.   Gastrointestinal: Negative.   Genitourinary:       Wears depends daily - she has tried meds for bladder in past but caused dry mouth and constipation.  She also is not good candidate for Myrbetriq due to h/o a.fib   Musculoskeletal: Positive for joint pain.  Skin: Negative.   Neurological: Negative.   Endo/Heme/Allergies: Negative.   Psychiatric/Behavioral: Negative.      Current Medications (verified) Outpatient Encounter Prescriptions as of 10/04/2015  Medication Sig  . b complex vitamins tablet Take 1 tablet by mouth daily.   . Cholecalciferol (VITAMIN D3) 1000 UNITS tablet Take 2,000 Units by mouth daily.    . Fe Fum-FePoly-Vit C-Vit B3 (INTEGRA) 62.5-62.5-40-3 MG CAPS TAKE ONE CAPSULE BY MOUTH ONE TIME DAILY  . fish oil-omega-3 fatty acids 1000 MG capsule Take 2 g by mouth daily.  . furosemide (LASIX) 20 MG tablet Take 1 tablet (20 mg total) by mouth daily as needed.  . polyethylene glycol powder (GLYCOLAX/MIRALAX) powder MIX 1 SCOOP (17 GRM) IN 4-6 OUNCES OF WATER, TEA, OR COFFEE AND DRINK ONCE A DAY AS NEEDED FOR CONSTIPATION  . vitamin C (ASCORBIC ACID) 500 MG tablet Take 500 mg by mouth daily.    . vitamin E 400 UNIT capsule Take 400 Units by mouth daily.  Marland Kitchen warfarin (COUMADIN) 5 MG tablet TAKE 1/2 OR 1 TABLET BY MOUTH ONCE A DAY OR AS INSTRUCTED BY COUMADIN ANTICOAGULATION CLINIC   No facility-administered encounter medications on file as of 10/04/2015.    Allergies (verified) Atorvastatin;  Boniva; Chocolate; Lovastatin; Pravastatin sodium; Risedronate sodium; Toprol xl; Visken; and Welchol   History: Past Medical History  Diagnosis Date  . Atrial fibrillation ([place])   . Hypertension   . Heart failure, diastolic ([place])   . Carotid bruit   . S/P AV nodal ablation   . Ovarian cyst   . Colon disorder     Adhesions  . QT prolongation     With solatol  . Nephrolithiasis   . Mitral regurgitation     Moderate  . Hyperlipidemia     statin intolerant  . Restless leg syndrome   . Cataract    Past Surgical History  Procedure Laterality Date  . Pacemaker insertion      St. Jude  . Cystectomy      Ovarian  . Abdominal adhesion surgery      twice  . Cataract extraction      Bilaterally  . Colon surgery      Colonic obstruction secondary to diverticulitis with resection  . Joint replacement Right 2004    hip  . Breast surgery Left 2010    lumpectomy  . Permanent pacemaker generator change N/A 07/22/2012    Procedure: PERMANENT PACEMAKER GENERATOR CHANGE;  Surgeon: Deboraha Sprang, MD;  Location: [facility] CATH LAB;  Service: Cardiovascular;  Laterality: N/A;  . Eye surgery     Family History  Problem Relation Age of Onset  . Diabetes Mother  type 2  . Diabetes Father     type 2  . Cancer Sister 67    colon   Social History   Occupational History  . Retired    Social History Main Topics  . Smoking status: Never Smoker   . Smokeless tobacco: Never Used  . Alcohol Use: No  . Drug Use: No  . Sexual Activity: No    Do you feel safe at home?  No  Dietary issues and exercise activities: Current Exercise Habits: The patient does not participate in regular exercise at present, Exercise limited by: orthopedic condition(s)  Current Dietary habits:  Patient eats oatmeal and high fiber cereal for breakfast.  She tries to limit fried foods and eats lots of vegetables.   Objective:    Today's Vitals   10/04/15 1126  BP: 128/76  Pulse: 62  Height: 5\' 5"  (1.651 m)   Weight: 138 lb (62.596 kg)  PainSc: 0-No pain   Body mass index is 22.96 kg/(m^2).   INR was 1.9 today (up from 1.8)  Activities of Daily Living In your present state of health, do you have any difficulty performing the following activities: 10/04/2015 10/04/2014  Hearing? N N  Vision? N N  Difficulty concentrating or making decisions? N N  Walking or climbing stairs? Y N  Dressing or bathing? Y N  Doing errands, shopping? N N  Preparing Food and eating ? N N  Using the Toilet? N N  In the past six months, have you accidently leaked urine? Y Y  Do you have problems with loss of bowel control? N N  Managing your Medications? N N  Managing your Finances? N N  Housekeeping or managing your Housekeeping? N N    Are there smokers in your home (other than you)? No   Cardiac Risk Factors include: advanced age (>75men, >52 women);dyslipidemia;sedentary lifestyle  Depression Screen PHQ 2/9 Scores 10/04/2015 08/23/2015 04/23/2015 12/11/2014  PHQ - 2 Score 0 0 0 0    Fall Risk Fall Risk  10/04/2015 08/23/2015 04/23/2015 12/11/2014 10/04/2014  Falls in the past year? No Yes No Yes Yes  Number falls in past yr: - 1 - 1 1  Injury with Fall? - No - No No  Follow up Falls prevention discussed - - - Education provided;Falls prevention discussed    Cognitive Function: MMSE - Mini Mental State Exam 10/04/2015 10/04/2014  Orientation to time 4 5  Orientation to Place 5 5  Registration 3 3  Attention/ Calculation 5 2  Recall 3 3  Language- name 2 objects 2 2  Language- repeat 0 1  Language- follow 3 step command 3 3  Language- read & follow direction 1 1  Write a sentence 1 1  Copy design 1 1  Total score 28 27    Immunizations and Health Maintenance Immunization History  Administered Date(s) Administered  . Influenza Whole 04/27/2010  . Influenza,inj,Quad PF,36+ Mos 05/05/2013, 05/18/2014, 04/23/2015  . Pneumococcal Conjugate-13 12/08/2013  . Pneumococcal Polysaccharide-23 07/28/1998  . Td  07/28/2000  . Tdap 03/29/2011   There are no preventive care reminders to display for this patient.  Patient Care Team: Chipper Herb, MD as PCP - General (Family Medicine) Minus Breeding, MD as Consulting Physician (Cardiology) Deboraha Sprang, MD as Consulting Physician (Cardiology) Garald Balding, MD as Consulting Physician (Orthopedic Surgery) Williams Che, MD as Consulting Physician (Ophthalmology)  Indicate any recent Medical Services you may have received from other than [facility] providers in the  past year (date may be approximate).    Assessment:    Annual Wellness Visit  Slightly subtherapeutic anticoagulation   Screening Tests Health Maintenance  Topic Date Due  . ZOSTAVAX  06/12/2016 (Originally 07/27/1979)  . DEXA SCAN  04/22/2020 (Originally 07/26/1984)  . INFLUENZA VACCINE  02/26/2016  . TETANUS/TDAP  03/28/2021  . PNA vac Low Risk Adult  Completed        Plan:   During the course of the visit Callie was educated and counseled about the following appropriate screening and preventive services:   Vaccines - UTD except for Zostavax and HepB which patient declines  Colorectal cancer screening - FOBT is UTD; colonoscopy is not longer required  Cardiovascular disease screening - EKG is UTD; patient sees cardiologist regularly  Lipids - patient has elevated LDL / total cholesterol but she has been intolerant of statins in past  Diabetes screening - last FBG was 93 (07/2015)  Bone Denisty / Osteoporosis Screening - patient declines  Mammogram - patient has history of breast cancer with left lumpectomy.  Declines mammogram.  Continues to do self breast exams.  PAP - not longer required  Glaucoma screening /  Eye Exam - due now; reminded to make appt  Nutrition counseling - continue to eat a variety of vegetables and fruits. Choose whole grains and lean proteins (eggs, nuts, beans, fish)   Try to do chair exercises from handout daily.  Advanced  Directives - UTD, copy requested  RTC in 1 month to recheck INR / Protime  Anticoagulation Dose Instructions as of 10/04/2015      Dorene Grebe Tue Wed Thu Fri Sat   New Dose 5 mg 2.5 mg 5 mg 2.5 mg 5 mg 2.5 mg 5 mg    Description        Take 1 and 1/2 tablets today (Thursday, march 9th, 2017).  Then continue warfarin 5mg  - take 1/2 tablet on mondays, wednesdays and fridays only.  Take 1 tablet on sundays, tuesdays, thursdays and saturdays.          Patient Instructions (the written plan) were given to the patient.   Cherre Robins, [facility]   10/04/2015

## 2015-10-04 NOTE — Patient Instructions (Addendum)
Anticoagulation Dose Instructions as of 10/04/2015      [name] Tue Wed Thu Fri Sat   New Dose 5 mg 2.5 mg 5 mg 2.5 mg 5 mg 2.5 mg 5 mg    Description        Take 1 and 1/2 tablets today (Thursday, march 9th, 2017).  Then continue warfarin 5mg  - take 1/2 tablet on mondays, wednesdays and fridays only.  Take 1 tablet on sundays, tuesdays, thursdays and saturdays.        INR was 1.9 today.  [name] , Thank you for taking time to come for your Medicare Wellness Visit. I appreciate your ongoing commitment to your health goals. Please review the following plan we discussed and let me know if I can assist you in the future.   These are the goals we discussed: Eat a variety of vegetables and fruits.  Choose whole grains and lean proteins (eggs, nuts, beans, fish)  Try to do chair exercises from handout daily. Have eye exam yearly - due now.   This is a list of the screening recommended for you and due dates:  Health Maintenance  Topic Date Due  . Shingles Vaccine  06/12/2016*  . DEXA scan (bone density measurement)  04/22/2020*  . Flu Shot  02/26/2016  . Tetanus Vaccine  03/28/2021  . Pneumonia vaccines  Completed  *Topic was postponed. The date shown is not the original due date.    Breast Self-Awareness Practicing breast self-awareness may pick up problems early, prevent significant medical complications, and possibly save your life. By practicing breast self-awareness, you can become familiar with how your breasts look and feel and if your breasts are changing. This allows you to notice changes early. It can also offer you some reassurance that your breast health is good. One way to learn what is normal for your breasts and whether your breasts are changing is to do a breast self-exam. If you find a lump or something that was not present in the past, it is best to contact your caregiver right away. Other findings that should be evaluated by your caregiver include nipple discharge,  especially if it is bloody; skin changes or reddening; areas where the skin seems to be pulled in (retracted); or new lumps and bumps. Breast pain is seldom associated with cancer (malignancy), but should also be evaluated by a caregiver. HOW TO PERFORM A BREAST SELF-EXAM The best time to examine your breasts is 5-7 days after your menstrual period is over. During menstruation, the breasts are lumpier, and it may be more difficult to pick up changes. If you do not menstruate, have reached menopause, or had your uterus removed (hysterectomy), you should examine your breasts at regular intervals, such as monthly. If you are breastfeeding, examine your breasts after a feeding or after using a breast pump. Breast implants do not decrease the risk for lumps or tumors, so continue to perform breast self-exams as recommended. Talk to your caregiver about how to determine the difference between the implant and breast tissue. Also, talk about the amount of pressure you should use during the exam. Over time, you will become more familiar with the variations of your breasts and more comfortable with the exam. A breast self-exam requires you to remove all your clothes above the waist. 1. Look at your breasts and nipples. Stand in front of a mirror in a room with good lighting. With your hands on your hips, push your hands firmly downward. Look for a  difference in shape, contour, and size from one breast to the other (asymmetry). Asymmetry includes puckers, dips, or bumps. Also, look for skin changes, such as reddened or scaly areas on the breasts. Look for nipple changes, such as discharge, dimpling, repositioning, or redness. 2. Carefully feel your breasts. This is best done either in the shower or tub while using soapy water or when flat on your back. Place the arm (on the side of the breast you are examining) above your head. Use the pads (not the fingertips) of your three middle fingers on your opposite hand to feel  your breasts. Start in the underarm area and use  inch (2 cm) overlapping circles to feel your breast. Use 3 different levels of pressure (light, medium, and firm pressure) at each circle before moving to the next circle. The light pressure is needed to feel the tissue closest to the skin. The medium pressure will help to feel breast tissue a little deeper, while the firm pressure is needed to feel the tissue close to the ribs. Continue the overlapping circles, moving downward over the breast until you feel your ribs below your breast. Then, move one finger-width towards the center of the body. Continue to use the  inch (2 cm) overlapping circles to feel your breast as you move slowly up toward the collar bone (clavicle) near the base of the neck. Continue the up and down exam using all 3 pressures until you reach the middle of the chest. Do this with each breast, carefully feeling for lumps or changes. 3.  Keep a written record with breast changes or normal findings for each breast. By writing this information down, you do not need to depend only on memory for size, tenderness, or location. Write down where you are in your menstrual cycle, if you are still menstruating. Breast tissue can have some lumps or thick tissue. However, see your caregiver if you find anything that concerns you.  SEEK MEDICAL CARE IF:  You see a change in shape, contour, or size of your breasts or nipples.   You see skin changes, such as reddened or scaly areas on the breasts or nipples.   You have an unusual discharge from your nipples.   You feel a new lump or unusually thick areas.    This information is not intended to replace advice given to you by your health care provider. Make sure you discuss any questions you have with your health care provider.   Document Released: 07/14/2005 Document Revised: 06/30/2012 Document Reviewed: 10/29/2011 Elsevier Interactive Patient Education 2016 [place] in the Home  Falls can cause injuries and can affect people from all age groups. There are many simple things that you can do to make your home safe and to help prevent falls. WHAT CAN I DO ON THE OUTSIDE OF MY HOME? 4. Regularly repair the edges of walkways and driveways and fix any cracks. 5. Remove high doorway thresholds. 6. Trim any shrubbery on the main path into your home. 7. Use bright outdoor lighting. 8. Clear walkways of debris and clutter, including tools and rocks. 9. Regularly check that handrails are securely fastened and in good repair. Both sides of any steps should have handrails. 10. Install guardrails along the edges of any raised decks or porches. 11. Have leaves, snow, and ice cleared regularly. 12. Use sand or salt on walkways during winter months. 13. In the garage, clean up any spills right away, including grease or  oil spills. WHAT CAN I DO IN THE BATHROOM?  Use night lights.  Install grab bars by the toilet and in the tub and shower. Do not use towel bars as grab bars.  Use non-skid mats or decals on the floor of the tub or shower.  If you need to sit down while you are in the shower, use a plastic, non-slip stool.Marland Kitchen  Keep the floor dry. Immediately clean up any water that spills on the floor.  Remove soap buildup in the tub or shower on a regular basis.  Attach bath mats securely with double-sided non-slip rug tape.  Remove throw rugs and other tripping hazards from the floor. WHAT CAN I DO IN THE BEDROOM?  Use night lights.  Make sure that a bedside light is easy to reach.  Do not use oversized bedding that drapes onto the floor.  Have a firm chair that has side arms to use for getting dressed.  Remove throw rugs and other tripping hazards from the floor. WHAT CAN I DO IN THE KITCHEN?   Clean up any spills right away.  Avoid walking on wet floors.  Place frequently used items in easy-to-reach places.  If you need to reach for  something above you, use a sturdy step stool that has a grab bar.  Keep electrical cables out of the way.  Do not use floor polish or wax that makes floors slippery. If you have to use wax, make sure that it is non-skid floor wax.  Remove throw rugs and other tripping hazards from the floor. WHAT CAN I DO IN THE STAIRWAYS?  Do not leave any items on the stairs.  Make sure that there are handrails on both sides of the stairs. Fix handrails that are broken or loose. Make sure that handrails are as long as the stairways.  Check any carpeting to make sure that it is firmly attached to the stairs. Fix any carpet that is loose or worn.  Avoid having throw rugs at the top or bottom of stairways, or secure the rugs with carpet tape to prevent them from moving.  Make sure that you have a light switch at the top of the stairs and the bottom of the stairs. If you do not have them, have them installed. WHAT ARE SOME OTHER FALL PREVENTION TIPS?  Wear closed-toe shoes that fit well and support your feet. Wear shoes that have rubber soles or low heels.  When you use a stepladder, make sure that it is completely opened and that the sides are firmly locked. Have someone hold the ladder while you are using it. Do not climb a closed stepladder.  Add color or contrast paint or tape to grab bars and handrails in your home. Place contrasting color strips on the first and last steps.  Use mobility aids as needed, such as canes, walkers, scooters, and crutches.  Turn on lights if it is dark. Replace any light bulbs that burn out.  Set up furniture so that there are clear paths. Keep the furniture in the same spot.  Fix any uneven floor surfaces.  Choose a carpet design that does not hide the edge of steps of a stairway.  Be aware of any and all pets.  Review your medicines with your healthcare provider. Some medicines can cause dizziness or changes in blood pressure, which increase your risk of  falling. Talk with your health care provider about other ways that you can decrease your risk of falls. This may include  working with a physical therapist or trainer to improve your strength, balance, and endurance.   This information is not intended to replace advice given to you by your health care provider. Make sure you discuss any questions you have with your health care provider.   Document Released: 07/04/2002 Document Revised: 11/28/2014 Document Reviewed: 08/18/2014 Elsevier Interactive Patient Education [employer].

## 2015-10-05 ENCOUNTER — Telehealth: Payer: Self-pay | Admitting: Pharmacist

## 2015-10-08 NOTE — Telephone Encounter (Signed)
Mailed to patient

## 2015-10-18 ENCOUNTER — Telehealth: Payer: Self-pay | Admitting: Pharmacist

## 2015-10-18 NOTE — Telephone Encounter (Signed)
These were mailed 10/08/2015 but will mail again since not received.

## 2015-11-01 ENCOUNTER — Ambulatory Visit (INDEPENDENT_AMBULATORY_CARE_PROVIDER_SITE_OTHER): Payer: Medicare Other | Admitting: Pharmacist

## 2015-11-01 ENCOUNTER — Encounter (INDEPENDENT_AMBULATORY_CARE_PROVIDER_SITE_OTHER): Payer: Self-pay

## 2015-11-01 DIAGNOSIS — I482 Chronic atrial fibrillation, unspecified: Secondary | ICD-10-CM

## 2015-11-01 DIAGNOSIS — Z7901 Long term (current) use of anticoagulants: Secondary | ICD-10-CM

## 2015-11-01 LAB — COAGUCHEK XS/INR WAIVED
INR: 1.9 — AB (ref 0.9–1.1)
PROTHROMBIN TIME: 22.5 s

## 2015-11-01 NOTE — Patient Instructions (Signed)
Anticoagulation Dose Instructions as of 11/01/2015      [name] Tue Wed Thu Fri Sat   New Dose 5 mg 2.5 mg 5 mg 5 mg 5 mg 2.5 mg 5 mg    Description        Take 1 and 1/2 tablets today (Thursday, April 6th, 2017).  Then increase dose to warfarin 5mg  - take 1/2 tablet on mondays and fridays only.  Take 1 tablet on sundays, tuesdays, thursdays and saturdays.        INR was 1.9 today

## 2015-11-24 ENCOUNTER — Ambulatory Visit (INDEPENDENT_AMBULATORY_CARE_PROVIDER_SITE_OTHER): Payer: Medicare Other | Admitting: Family Medicine

## 2015-11-24 VITALS — BP 153/84 | HR 74 | Temp 96.8°F | Ht 65.0 in | Wt 137.2 lb

## 2015-11-24 DIAGNOSIS — N3090 Cystitis, unspecified without hematuria: Secondary | ICD-10-CM | POA: Diagnosis not present

## 2015-11-24 DIAGNOSIS — R399 Unspecified symptoms and signs involving the genitourinary system: Secondary | ICD-10-CM | POA: Diagnosis not present

## 2015-11-24 MED ORDER — NITROFURANTOIN MONOHYD MACRO 100 MG PO CAPS: 100.0000 mg | ORAL_CAPSULE | Freq: Two times a day (BID) | ORAL | Status: DC

## 2015-11-24 NOTE — Addendum Note (Signed)
Addended by: Claretta Fraise on: 11/24/2015 12:43 PM   Modules accepted: Miquel Dunn

## 2015-11-24 NOTE — Progress Notes (Signed)
Subjective:  Patient ID: [name], female    DOB: [birth year]  Age: 82 y.o. MRN: [id number]  CC: Urinary Frequency   HPI Charnay B Sonnenberg presents for burning with urination and frequency for two days. Denies fever . No flank pain. No nausea, vomiting.Daughter says she is quite allert and cognizant.    History Delvina has a past medical history of Atrial fibrillation ([place]); Hypertension; Heart failure, diastolic ([place]); Carotid bruit; S/P AV nodal ablation; Ovarian cyst; Colon disorder; QT prolongation; Nephrolithiasis; Mitral regurgitation; Hyperlipidemia; Restless leg syndrome; and Cataract.   She has past surgical history that includes Pacemaker insertion; Cystectomy; Abdominal adhesion surgery; Cataract extraction; Colon surgery; Joint replacement (Right, 2004); Permanent pacemaker generator change (N/A, 07/22/2012); Eye surgery; and Breast surgery (Left, 2010).   Her family history includes Cancer (age of onset: 36) in her sister; Diabetes in her father and mother.She reports that she has never smoked. She has never used smokeless tobacco. She reports that she does not drink alcohol or use illicit drugs.    ROS Review of Systems  Constitutional: Negative for fever, chills and diaphoresis.  HENT: Negative for congestion.   Eyes: Negative for visual disturbance.  Respiratory: Negative for cough and shortness of breath.   Cardiovascular: Negative for chest pain and palpitations.  Gastrointestinal: Negative for nausea, diarrhea and constipation.  Genitourinary: Positive for dysuria, urgency and frequency. Negative for hematuria, flank pain, decreased urine volume, menstrual problem and pelvic pain.  Musculoskeletal: Negative for joint swelling and arthralgias.  Skin: Negative for rash.  Neurological: Negative for dizziness and numbness.    Objective:  BP 153/84 mmHg  Pulse 74  Temp(Src) 96.8 F (36 C) (Oral)  Ht 5\' 5"  (1.651 m)  Wt 137 lb 3.2 oz (62.234 kg)  BMI 22.83  kg/m2  SpO2 100%  BP Readings from Last 3 Encounters:  11/24/15 153/84  10/04/15 128/76  08/30/15 120/79    Wt Readings from Last 3 Encounters:  11/24/15 137 lb 3.2 oz (62.234 kg)  10/04/15 138 lb (62.596 kg)  08/30/15 135 lb (61.236 kg)     Physical Exam  Constitutional: She is oriented to person, place, and time. She appears well-developed and well-nourished.  HENT:  Head: Normocephalic and atraumatic.  Cardiovascular: Normal rate and regular rhythm.   No murmur heard. Pulmonary/Chest: Effort normal and breath sounds normal.  Abdominal: Soft. Bowel sounds are normal. She exhibits no mass. There is no tenderness. There is no rebound and no guarding.  Musculoskeletal: She exhibits no tenderness.  Neurological: She is alert and oriented to person, place, and time.  Skin: Skin is warm and dry.  Psychiatric: She has a normal mood and affect. Her behavior is normal.     Lab Results  Component Value Date   WBC 5.8 04/23/2015   HGB 10.3* 01/26/2015   HCT 34.2 04/23/2015   PLT 205 04/23/2015   GLUCOSE 93 08/23/2015   CHOL 212* 08/23/2015   TRIG 72 08/23/2015   HDL 52 08/23/2015   LDLCALC 146* 08/23/2015   ALT 16 08/23/2015   AST 26 08/23/2015   NA 139 08/23/2015   K 4.7 08/23/2015   CL 94* 08/23/2015   CREATININE 0.74 08/23/2015   BUN 19 08/23/2015   CO2 26 08/23/2015   TSH 3.737 12/13/2012   INR 1.9* 11/01/2015    Assessment & Plan:   Malea was seen today for urinary frequency.  Diagnoses and all orders for this visit:  UTI symptoms -     Urinalysis  Cystitis  Other orders -     nitrofurantoin, macrocrystal-monohydrate, (MACROBID) 100 MG capsule; Take 1 capsule (100 mg total) by mouth 2 (two) times daily.   I am having Ms. Bergman start on nitrofurantoin (macrocrystal-monohydrate). I am also having her maintain her b complex vitamins, vitamin C, cholecalciferol, vitamin E, fish oil-omega-3 fatty acids, INTEGRA, furosemide, warfarin, and polyethylene  glycol powder.  Meds ordered this encounter  Medications  . nitrofurantoin, macrocrystal-monohydrate, (MACROBID) 100 MG capsule    Sig: Take 1 capsule (100 mg total) by mouth 2 (two) times daily.    Dispense:  14 capsule    Refill:  0     Follow-up: No Follow-up on file.  Claretta Fraise, M.D.

## 2015-11-24 NOTE — Addendum Note (Signed)
Addended by: Claretta Fraise on: 11/24/2015 12:29 PM   Modules accepted: Miquel Dunn

## 2015-11-26 LAB — URINALYSIS
Bilirubin, UA: NEGATIVE
GLUCOSE, UA: NEGATIVE
KETONES UA: NEGATIVE
LEUKOCYTES UA: NEGATIVE
Nitrite, UA: NEGATIVE
PROTEIN UA: NEGATIVE
Specific Gravity, UA: 1.02 (ref 1.005–1.030)
Urobilinogen, Ur: 0.2 mg/dL (ref 0.2–1.0)
pH, UA: 7.5 (ref 5.0–7.5)

## 2015-11-29 ENCOUNTER — Ambulatory Visit (INDEPENDENT_AMBULATORY_CARE_PROVIDER_SITE_OTHER): Payer: Medicare Other | Admitting: *Deleted

## 2015-11-29 ENCOUNTER — Telehealth: Payer: Self-pay | Admitting: Cardiology

## 2015-11-29 DIAGNOSIS — I442 Atrioventricular block, complete: Secondary | ICD-10-CM

## 2015-11-29 NOTE — Telephone Encounter (Signed)
Spoke with pt and reminded pt of remote transmission that is due today. Pt verbalized understanding.   

## 2015-11-30 ENCOUNTER — Encounter: Payer: Self-pay | Admitting: Family Medicine

## 2015-11-30 ENCOUNTER — Encounter (INDEPENDENT_AMBULATORY_CARE_PROVIDER_SITE_OTHER): Payer: Self-pay

## 2015-11-30 ENCOUNTER — Ambulatory Visit (INDEPENDENT_AMBULATORY_CARE_PROVIDER_SITE_OTHER): Payer: Medicare Other | Admitting: Family Medicine

## 2015-11-30 VITALS — BP 155/85 | HR 66 | Temp 97.0°F | Ht 65.0 in | Wt 133.0 lb

## 2015-11-30 DIAGNOSIS — E785 Hyperlipidemia, unspecified: Secondary | ICD-10-CM

## 2015-11-30 DIAGNOSIS — I080 Rheumatic disorders of both mitral and aortic valves: Secondary | ICD-10-CM

## 2015-11-30 DIAGNOSIS — I482 Chronic atrial fibrillation, unspecified: Secondary | ICD-10-CM

## 2015-11-30 DIAGNOSIS — I442 Atrioventricular block, complete: Secondary | ICD-10-CM

## 2015-11-30 DIAGNOSIS — E559 Vitamin D deficiency, unspecified: Secondary | ICD-10-CM

## 2015-11-30 DIAGNOSIS — I10 Essential (primary) hypertension: Secondary | ICD-10-CM | POA: Diagnosis not present

## 2015-11-30 DIAGNOSIS — Z950 Presence of cardiac pacemaker: Secondary | ICD-10-CM

## 2015-11-30 DIAGNOSIS — N390 Urinary tract infection, site not specified: Secondary | ICD-10-CM

## 2015-11-30 LAB — COAGUCHEK XS/INR WAIVED
INR: 2.5 — ABNORMAL HIGH (ref 0.9–1.1)
PROTHROMBIN TIME: 29.6 s

## 2015-11-30 NOTE — Progress Notes (Signed)
Remote pacemaker transmission.   

## 2015-11-30 NOTE — Progress Notes (Signed)
Subjective:    Patient ID: [name], female    DOB: Dec 12, 1916, 82 y.o.   MRN: [id number]  HPI Pt here for follow up and management of chronic medical problems which includes a fib, hyperlipidemia, and hypertension. She is taking medications regularly.The patient has had a recent UTI has been treated with Macrodantin. She will get lab work today. Her pro time has already been posted and her INR is 2.5 and she will continue her current dose of Coumadin. The patient is alert and doing well. She saw one of the other providers because of urinary tract infection when she was having burning and frequency. She only has one pill left. She's been drinking plenty of fluids and she is feeling better. We will check a urine today. She denies any chest pain or shortness of breath anymore than usual she is not having any nausea vomiting diarrhea blood in the stool. She does have dark stools because she is on iron treatment. She is incontinent of urine but we will continue to try to drink plenty of fluids to stay well hydrated. She sees a cardiologist regularly.     Patient Active Problem List   Diagnosis Date Noted  . Ventral hernia 12/08/2013  . Vitamin D deficiency 12/08/2013  . History of breast cancer 12/08/2013  . Long term (current) use of anticoagulants 05/05/2013  . Shortness of breath 05/02/2013  . Restless leg syndrome 10/12/2010  . CAROTID BRUIT 08/21/2010  . MITRAL REGURGITATION 08/01/2009  . HYPERTENSION, BENIGN 08/17/2008  . AV BLOCK, COMPLETE 08/17/2008  . ATRIAL FIBRILLATION 08/17/2008  . PACEMAKER, PERMANENT 08/17/2008   Outpatient Encounter Prescriptions as of 11/30/2015  Medication Sig  . b complex vitamins tablet Take 1 tablet by mouth daily.   . Cholecalciferol (VITAMIN D3) 1000 UNITS tablet Take 2,000 Units by mouth daily.    . Fe Fum-FePoly-Vit C-Vit B3 (INTEGRA) 62.5-62.5-40-3 MG CAPS TAKE ONE CAPSULE BY MOUTH ONE TIME DAILY  . fish oil-omega-3 fatty acids 1000 MG  capsule Take 2 g by mouth daily.  . furosemide (LASIX) 20 MG tablet Take 1 tablet (20 mg total) by mouth daily as needed.  . nitrofurantoin, macrocrystal-monohydrate, (MACROBID) 100 MG capsule Take 1 capsule (100 mg total) by mouth 2 (two) times daily.  . polyethylene glycol powder (GLYCOLAX/MIRALAX) powder MIX 1 SCOOP (17 GRM) IN 4-6 OUNCES OF WATER, TEA, OR COFFEE AND DRINK ONCE A DAY AS NEEDED FOR CONSTIPATION  . vitamin C (ASCORBIC ACID) 500 MG tablet Take 500 mg by mouth daily.    . vitamin E 400 UNIT capsule Take 400 Units by mouth daily.  Marland Kitchen warfarin (COUMADIN) 5 MG tablet TAKE 1/2 OR 1 TABLET BY MOUTH ONCE A DAY OR AS INSTRUCTED BY COUMADIN ANTICOAGULATION CLINIC   No facility-administered encounter medications on file as of 11/30/2015.     Review of Systems  Constitutional: Negative.   HENT: Negative.   Eyes: Negative.   Respiratory: Negative.   Cardiovascular: Negative.   Gastrointestinal: Negative.   Endocrine: Negative.   Genitourinary: Negative.   Musculoskeletal: Negative.   Skin: Negative.   Allergic/Immunologic: Negative.   Neurological: Negative.   Hematological: Negative.   Psychiatric/Behavioral: Negative.        Objective:   Physical Exam  Constitutional: She is oriented to person, place, and time. She appears well-developed and well-nourished. No distress.  The patient is alert and looks much younger than her age of 82 years.  HENT:  Head: Normocephalic and atraumatic.  Left Ear: External  ear normal.  Nose: Nose normal.  Mouth/Throat: Oropharynx is clear and moist.  There is here cerumen in the right ear canal. I was unable to view the eardrum.  Eyes: Conjunctivae and EOM are normal. Pupils are equal, round, and reactive to light. Right eye exhibits no discharge. Left eye exhibits no discharge. No scleral icterus.  Neck: Normal range of motion. Neck supple. No thyromegaly present.  Cardiovascular: Normal rate and regular rhythm.  Exam reveals no gallop and  no friction rub.   Murmur heard. The heart was regular today at 72/m. There is a grade 3/6 systolic ejection murmur.  Pulmonary/Chest: Effort normal and breath sounds normal. No respiratory distress. She has no wheezes. She has no rales. She exhibits no tenderness.  The lungs were clear anteriorly and posteriorly  Abdominal: Soft. Bowel sounds are normal. She exhibits no mass. There is tenderness. There is no rebound and no guarding.  The patient has a ventral hernia and this is somewhat tender. Suprapubic area had no tenderness. There appeared to be no liver or spleen enlargement.  Musculoskeletal: Normal range of motion. She exhibits no edema or tenderness.  The patient uses a cane for ambulation.  Lymphadenopathy:    She has no cervical adenopathy.  Neurological: She is alert and oriented to person, place, and time.  Skin: Skin is warm and dry. No rash noted.  Psychiatric: She has a normal mood and affect. Her behavior is normal. Judgment and thought content normal.  Nursing note and vitals reviewed.   BP 158/83 mmHg  Pulse 70  Temp(Src) 97 F (36.1 C) (Oral)  Ht 5' 5"  (1.651 m)  Wt 133 lb (60.328 kg)  BMI 22.13 kg/m2       Assessment & Plan:  1. Chronic atrial fibrillation (HCC) -Continue with follow-up with cardiology -Continue current Coumadin dose and follow-up with clinical pharmacy in 4 weeks - CoaguChek XS/INR Waived - CBC with Differential/Platelet  2. HYPERTENSION, BENIGN The blood pressure was elevated today but no changes will be made in treatment regimen - BMP8+EGFR - CBC with Differential/Platelet - Hepatic function panel  3. Hyperlipemia -Continue with as aggressive therapeutic lifestyle changes as possible plus omega-3 fatty acids. - CBC with Differential/Platelet - Lipid panel  4. Vitamin D deficiency -Continue current treatment pending results of lab work - CBC with Differential/Platelet - VITAMIN D 25 Hydroxy (Vit-D Deficiency, Fractures)  5.  Urinary tract infection without hematuria, site unspecified -Finish Macrodantin. The symptoms have resolved that she had initially. -We will check urinalysis today and call patient with results as soon as those results are available  6. MITRAL REGURGITATION -Follow-up with cardiology  7. AV BLOCK, COMPLETE -Follow-up with cardiology  8. PACEMAKER, PERMANENT -The rate was about 70-72/m today and she will continue to follow-up with cardiology.  Patient Instructions                       Medicare Annual Wellness Visit  [facility] and the medical providers at [facility] strive to bring you the best medical care.  In doing so we not only want to address your current medical conditions and concerns but also to detect new conditions early and prevent illness, disease and health-related problems.    Medicare offers a yearly Wellness Visit which allows our clinical staff to assess your need for preventative services including immunizations, lifestyle education, counseling to decrease risk of preventable diseases and screening for fall risk and other medical concerns.  This visit is provided free of charge (no copay) for all Medicare recipients. The clinical pharmacists at [facility] have begun to conduct these Wellness Visits which will also include a thorough review of all your medications.    As you primary medical provider recommend that you make an appointment for your Annual Wellness Visit if you have not done so already this year.  You may set up this appointment before you leave today or you may call back [phone]) and schedule an appointment.  Please make sure when you call that you mention that you are scheduling your Annual Wellness Visit with the clinical pharmacist so that the appointment may be made for the proper length of time.     Continue current medications. Continue good therapeutic lifestyle changes which include good diet and  exercise. Fall precautions discussed with patient. If an FOBT was given today- please return it to our front desk. If you are over 58 years old - you may need Prevnar 41 or the adult Pneumonia vaccine.  **Flu shots are available--- please call and schedule a FLU-CLINIC appointment**  After your visit with [place] today you will receive a survey in the mail or online from [employer] regarding your care with [place]. Please take a moment to fill this out. Your feedback is very important to [place] as you can help [place] better understand your patient needs as well as improve your experience and satisfaction. WE CARE ABOUT YOU!!!   She should follow-up regularly with her cardiologist She should continue to drink plenty of fluids and stay well hydrated She can purchase Debrox eardrops over-the-counter input 3-4 drops in the right ear because of earwax for 3 or 4 nights in a row and wait 1 week and repeat this process. This will help to soften the earwax. She should continue with her Coumadin as she is doing because her INR was good.   Arrie Senate MD

## 2015-11-30 NOTE — Patient Instructions (Addendum)
Medicare Annual Wellness Visit  [facility] and the medical providers at [facility] strive to bring you the best medical care.  In doing so we not only want to address your current medical conditions and concerns but also to detect new conditions early and prevent illness, disease and health-related problems.    Medicare offers a yearly Wellness Visit which allows our clinical staff to assess your need for preventative services including immunizations, lifestyle education, counseling to decrease risk of preventable diseases and screening for fall risk and other medical concerns.    This visit is provided free of charge (no copay) for all Medicare recipients. The clinical pharmacists at [facility] have begun to conduct these Wellness Visits which will also include a thorough review of all your medications.    As you primary medical provider recommend that you make an appointment for your Annual Wellness Visit if you have not done so already this year.  You may set up this appointment before you leave today or you may call back [phone] and schedule an appointment.  Please make sure when you call that you mention that you are scheduling your Annual Wellness Visit with the clinical pharmacist so that the appointment may be made for the proper length of time.     Continue current medications. Continue good therapeutic lifestyle changes which include good diet and exercise. Fall precautions discussed with patient. If an FOBT was given today- please return it to our front desk. If you are over 15 years old - you may need Prevnar 59 or the adult Pneumonia vaccine.  **Flu shots are available--- please call and schedule a FLU-CLINIC appointment**  After your visit with [place] today you will receive a survey in the mail or online from [employer] regarding your care with [place]. Please take a moment to fill this out. Your feedback is very  important to [place] as you can help [place] better understand your patient needs as well as improve your experience and satisfaction. WE CARE ABOUT YOU!!!   She should follow-up regularly with her cardiologist She should continue to drink plenty of fluids and stay well hydrated She can purchase Debrox eardrops over-the-counter input 3-4 drops in the right ear because of earwax for 3 or 4 nights in a row and wait 1 week and repeat this process. This will help to soften the earwax. She should continue with her Coumadin as she is doing because her INR was good.

## 2015-12-01 LAB — HEPATIC FUNCTION PANEL
ALBUMIN: 4.8 g/dL — AB (ref 3.2–4.6)
ALT: 11 IU/L (ref 0–32)
AST: 23 IU/L (ref 0–40)
Alkaline Phosphatase: 61 IU/L (ref 39–117)
Bilirubin Total: 1 mg/dL (ref 0.0–1.2)
Bilirubin, Direct: 0.31 mg/dL (ref 0.00–0.40)
TOTAL PROTEIN: 7.5 g/dL (ref 6.0–8.5)

## 2015-12-01 LAB — CBC WITH DIFFERENTIAL/PLATELET
BASOS ABS: 0 10*3/uL (ref 0.0–0.2)
Basos: 1 %
EOS (ABSOLUTE): 0.1 10*3/uL (ref 0.0–0.4)
EOS: 2 %
HEMATOCRIT: 35.4 % (ref 34.0–46.6)
HEMOGLOBIN: 11.8 g/dL (ref 11.1–15.9)
IMMATURE GRANS (ABS): 0 10*3/uL (ref 0.0–0.1)
Immature Granulocytes: 0 %
LYMPHS ABS: 1.9 10*3/uL (ref 0.7–3.1)
Lymphs: 35 %
MCH: 29.3 pg (ref 26.6–33.0)
MCHC: 33.3 g/dL (ref 31.5–35.7)
MCV: 88 fL (ref 79–97)
Monocytes Absolute: 0.7 10*3/uL (ref 0.1–0.9)
Monocytes: 13 %
NEUTROS ABS: 2.7 10*3/uL (ref 1.4–7.0)
Neutrophils: 49 %
Platelets: 228 10*3/uL (ref 150–379)
RBC: 4.03 x10E6/uL (ref 3.77–5.28)
RDW: 14.1 % (ref 12.3–15.4)
WBC: 5.3 10*3/uL (ref 3.4–10.8)

## 2015-12-01 LAB — LIPID PANEL
CHOL/HDL RATIO: 4.4 ratio (ref 0.0–4.4)
Cholesterol, Total: 262 mg/dL — ABNORMAL HIGH (ref 100–199)
HDL: 59 mg/dL (ref >39)
LDL CALC: 185 mg/dL — AB (ref 0–99)
TRIGLYCERIDES: 91 mg/dL (ref 0–149)
VLDL CHOLESTEROL CAL: 18 mg/dL (ref 5–40)

## 2015-12-01 LAB — BMP8+EGFR
BUN / CREAT RATIO: 24 (ref 12–28)
BUN: 18 mg/dL (ref 10–36)
CHLORIDE: 91 mmol/L — AB (ref 96–106)
CO2: 23 mmol/L (ref 18–29)
CREATININE: 0.76 mg/dL (ref 0.57–1.00)
Calcium: 10.3 mg/dL (ref 8.7–10.3)
GFR calc Af Amer: 77 mL/min/{1.73_m2} (ref >59)
GFR calc non Af Amer: 66 mL/min/{1.73_m2} (ref >59)
GLUCOSE: 100 mg/dL — AB (ref 65–99)
POTASSIUM: 4.6 mmol/L (ref 3.5–5.2)
SODIUM: 136 mmol/L (ref 134–144)

## 2015-12-01 LAB — VITAMIN D 25 HYDROXY (VIT D DEFICIENCY, FRACTURES): Vit D, 25-Hydroxy: 38 ng/mL (ref 30.0–100.0)

## 2015-12-06 ENCOUNTER — Encounter: Payer: Self-pay | Admitting: Pharmacist

## 2015-12-12 ENCOUNTER — Telehealth: Payer: Self-pay | Admitting: Family Medicine

## 2015-12-12 MED ORDER — SOLIFENACIN SUCCINATE 5 MG PO TABS: 5.0000 mg | ORAL_TABLET | Freq: Every day | ORAL | Status: DC

## 2015-12-12 NOTE — Telephone Encounter (Signed)
Try Vesicare as clinical pharmacy recommended----please have daughter call us back with response to this.

## 2015-12-12 NOTE — Telephone Encounter (Signed)
rx sent in  - daughter aware

## 2015-12-25 ENCOUNTER — Other Ambulatory Visit: Payer: Self-pay | Admitting: Family Medicine

## 2016-01-02 ENCOUNTER — Encounter: Payer: Self-pay | Admitting: Cardiology

## 2016-01-08 LAB — CUP PACEART REMOTE DEVICE CHECK
Battery Remaining Longevity: 146 mo
Date Time Interrogation Session: 20170504231619
Implantable Lead Implant Date: 20020418
Implantable Lead Location: 753860
Implantable Lead Model: 5076
Lead Channel Pacing Threshold Amplitude: 0.625 V
Lead Channel Pacing Threshold Pulse Width: 0.4 ms
Lead Channel Setting Sensing Sensitivity: 4 mV
MDC IDC MSMT BATTERY REMAINING PERCENTAGE: 95.5 %
MDC IDC MSMT BATTERY VOLTAGE: 2.96 V
MDC IDC MSMT LEADCHNL RV IMPEDANCE VALUE: 460 Ohm
MDC IDC MSMT LEADCHNL RV SENSING INTR AMPL: 12 mV
MDC IDC PG SERIAL: 7331332
MDC IDC SET LEADCHNL RV PACING AMPLITUDE: 0.875
MDC IDC SET LEADCHNL RV PACING PULSEWIDTH: 0.4 ms
MDC IDC STAT BRADY RV PERCENT PACED: 99 %

## 2016-01-10 ENCOUNTER — Ambulatory Visit (INDEPENDENT_AMBULATORY_CARE_PROVIDER_SITE_OTHER): Payer: Medicare Other | Admitting: Pharmacist

## 2016-01-10 DIAGNOSIS — I482 Chronic atrial fibrillation, unspecified: Secondary | ICD-10-CM

## 2016-01-10 DIAGNOSIS — Z7901 Long term (current) use of anticoagulants: Secondary | ICD-10-CM | POA: Diagnosis not present

## 2016-01-10 LAB — COAGUCHEK XS/INR WAIVED
INR: 1.8 — AB (ref 0.9–1.1)
PROTHROMBIN TIME: 21.3 s

## 2016-01-10 NOTE — Patient Instructions (Signed)
Anticoagulation Dose Instructions as of 01/10/2016      Dorene Grebe Tue Wed Thu Fri Sat   New Dose 5 mg 2.5 mg 5 mg 5 mg 5 mg 5 mg 5 mg    Description        Increase dose to warfarin 5mg   - take 1/2 tablet on mondays only.  Take 1 all other days.        INR was 1.8 today

## 2016-01-16 DIAGNOSIS — R300 Dysuria: Secondary | ICD-10-CM | POA: Diagnosis not present

## 2016-01-17 DIAGNOSIS — R300 Dysuria: Secondary | ICD-10-CM | POA: Diagnosis not present

## 2016-01-18 ENCOUNTER — Telehealth: Payer: Self-pay | Admitting: Family Medicine

## 2016-01-18 NOTE — Telephone Encounter (Signed)
Samples placed at front desk.  

## 2016-01-18 NOTE — Telephone Encounter (Signed)
We could try some samples of Myrbetriq 25 mg if she can have somebody come and pick up samples if we do not have samples we can call her in maybe 10 tablets to try

## 2016-01-19 ENCOUNTER — Telehealth: Payer: Self-pay | Admitting: *Deleted

## 2016-01-19 NOTE — Telephone Encounter (Signed)
Patient's daughter called back about the samples that were placed up front. Discussed that it was Myrbetriq and that it is used for over active bladder. She would prefer to not try anything new at this time. Stated that her mother is 58 and that the side effects are sometimes worse than the problem itself. Stated that I understood and that I would remove the samples. She will call back if she changes her mind.

## 2016-01-31 ENCOUNTER — Other Ambulatory Visit: Payer: Self-pay | Admitting: Pharmacist

## 2016-01-31 MED ORDER — INTEGRA 62.5-62.5-40-3 MG PO CAPS: ORAL_CAPSULE | ORAL | Status: DC

## 2016-01-31 NOTE — Telephone Encounter (Signed)
rx sent to [facility] removed from medication list.  Of note several med for OAB can affect heart rate and this could have been effect she was feeling.

## 2016-02-07 ENCOUNTER — Encounter: Payer: Self-pay | Admitting: Pharmacist

## 2016-02-08 ENCOUNTER — Encounter: Payer: Self-pay | Admitting: Pharmacist

## 2016-02-08 ENCOUNTER — Ambulatory Visit: Payer: Medicare Other | Admitting: Family Medicine

## 2016-02-11 ENCOUNTER — Other Ambulatory Visit: Payer: Self-pay | Admitting: *Deleted

## 2016-02-11 ENCOUNTER — Telehealth: Payer: Self-pay | Admitting: Family Medicine

## 2016-02-11 DIAGNOSIS — M79605 Pain in left leg: Secondary | ICD-10-CM

## 2016-02-11 DIAGNOSIS — M79604 Pain in right leg: Secondary | ICD-10-CM

## 2016-02-11 DIAGNOSIS — R29898 Other symptoms and signs involving the musculoskeletal system: Secondary | ICD-10-CM

## 2016-02-11 DIAGNOSIS — R5381 Other malaise: Secondary | ICD-10-CM

## 2016-02-14 ENCOUNTER — Ambulatory Visit (INDEPENDENT_AMBULATORY_CARE_PROVIDER_SITE_OTHER): Payer: Medicare Other | Admitting: Pharmacist

## 2016-02-14 DIAGNOSIS — I482 Chronic atrial fibrillation, unspecified: Secondary | ICD-10-CM

## 2016-02-14 DIAGNOSIS — Z7901 Long term (current) use of anticoagulants: Secondary | ICD-10-CM

## 2016-02-14 LAB — COAGUCHEK XS/INR WAIVED
INR: 2.2 — AB (ref 0.9–1.1)
PROTHROMBIN TIME: 25.9 s

## 2016-02-14 NOTE — Patient Instructions (Signed)
Anticoagulation Dose Instructions as of 02/14/2016      Dorene Grebe Tue Wed Thu Fri Sat   New Dose 5 mg 5 mg 5 mg 2.5 mg 5 mg 5 mg 5 mg    Description        Continue warfarin 5mg   - take 1/2 tablet on Wednesdays only.  Take 1 all other days.        INR was 2.2 today

## 2016-02-15 ENCOUNTER — Other Ambulatory Visit: Payer: Self-pay | Admitting: Family Medicine

## 2016-02-15 MED ORDER — WARFARIN SODIUM 5 MG PO TABS: ORAL_TABLET | ORAL | Status: DC

## 2016-02-15 NOTE — Telephone Encounter (Signed)
Rx sent over to the pharmacy and called the pharmacy so they would get it ready for pt.

## 2016-02-20 ENCOUNTER — Encounter: Payer: Self-pay | Admitting: Family Medicine

## 2016-02-20 ENCOUNTER — Ambulatory Visit (INDEPENDENT_AMBULATORY_CARE_PROVIDER_SITE_OTHER): Payer: Medicare Other | Admitting: Family Medicine

## 2016-02-20 VITALS — BP 138/74 | HR 72 | Temp 97.0°F | Ht 65.0 in | Wt 136.0 lb

## 2016-02-20 DIAGNOSIS — T887XXA Unspecified adverse effect of drug or medicament, initial encounter: Secondary | ICD-10-CM

## 2016-02-20 DIAGNOSIS — R531 Weakness: Secondary | ICD-10-CM | POA: Diagnosis not present

## 2016-02-20 DIAGNOSIS — I482 Chronic atrial fibrillation, unspecified: Secondary | ICD-10-CM

## 2016-02-20 DIAGNOSIS — R32 Unspecified urinary incontinence: Secondary | ICD-10-CM

## 2016-02-20 DIAGNOSIS — R739 Hyperglycemia, unspecified: Secondary | ICD-10-CM

## 2016-02-20 DIAGNOSIS — N393 Stress incontinence (female) (male): Secondary | ICD-10-CM | POA: Diagnosis not present

## 2016-02-20 DIAGNOSIS — Z7901 Long term (current) use of anticoagulants: Secondary | ICD-10-CM

## 2016-02-20 DIAGNOSIS — I080 Rheumatic disorders of both mitral and aortic valves: Secondary | ICD-10-CM | POA: Diagnosis not present

## 2016-02-20 DIAGNOSIS — E1165 Type 2 diabetes mellitus with hyperglycemia: Secondary | ICD-10-CM | POA: Diagnosis not present

## 2016-02-20 DIAGNOSIS — R7309 Other abnormal glucose: Secondary | ICD-10-CM | POA: Diagnosis not present

## 2016-02-20 DIAGNOSIS — T50905A Adverse effect of unspecified drugs, medicaments and biological substances, initial encounter: Secondary | ICD-10-CM

## 2016-02-20 NOTE — Progress Notes (Signed)
Subjective:    Patient ID: [name], female    DOB: [birth year], 82 y.o.   MRN: [id number]  HPI Patient here today for follow up on a recent med change. The medication, Vesicare made her feel weak, nervous and caused a sore throat. She has been off of this for about 6 weeks now. Her nurse anxiety and weakness are some better. She is also complaining with some problems with her ears. The patient comes to the visit today with her daughter. She is calm and alert and relaxed. She uses a walker for ambulation. She denies any chest pain or shortness of breath. She has no trouble with her GI tract including nausea vomiting diarrhea or trouble swallowing blood in the stool or black tarry bowel movements. She is passing her water without any burning but is incontinent again. Medicine that she took lasted for about 5 weeks.     Patient Active Problem List   Diagnosis Date Noted  . Ventral hernia 12/08/2013  . Vitamin D deficiency 12/08/2013  . Breast cancer ([place]) 12/08/2013  . Long term (current) use of anticoagulants 05/05/2013  . Shortness of breath 05/02/2013  . Restless leg syndrome 10/12/2010  . CAROTID BRUIT 08/21/2010  . MITRAL REGURGITATION 08/01/2009  . HYPERTENSION, BENIGN 08/17/2008  . AV BLOCK, COMPLETE 08/17/2008  . ATRIAL FIBRILLATION 08/17/2008  . PACEMAKER, PERMANENT 08/17/2008   Outpatient Encounter Prescriptions as of 02/20/2016  Medication Sig  . b complex vitamins tablet Take 1 tablet by mouth daily.   . Cholecalciferol (VITAMIN D3) 1000 UNITS tablet Take 2,000 Units by mouth daily.    . Fe Fum-FePoly-Vit C-Vit B3 (INTEGRA) 62.5-62.5-40-3 MG CAPS TAKE ONE CAPSULE BY MOUTH ONE TIME DAILY  . fish oil-omega-3 fatty acids 1000 MG capsule Take 2 g by mouth daily.  . furosemide (LASIX) 20 MG tablet Take 1 tablet (20 mg total) by mouth daily as needed.  . polyethylene glycol powder (GLYCOLAX/MIRALAX) powder MIX 1 SCOOP (17 GRM) IN 4-6 OUNCES OF WATER, TEA, OR COFFEE AND  DRINK ONCE A DAY AS NEEDED FOR CONSTIPATION  . vitamin C (ASCORBIC ACID) 500 MG tablet Take 500 mg by mouth daily.    . vitamin E 400 UNIT capsule Take 400 Units by mouth daily.  Marland Kitchen warfarin (COUMADIN) 5 MG tablet TAKE 1/2 OR 1 TABLET BY MOUTH ONCE A DAY OR AS INSTRUCTED BY COUMADIN ANTICOAGULATION CLINIC  . [DISCONTINUED] nitrofurantoin, macrocrystal-monohydrate, (MACROBID) 100 MG capsule Take 1 capsule (100 mg total) by mouth 2 (two) times daily.   No facility-administered encounter medications on file as of 02/20/2016.       Review of Systems  HENT: Negative.   Eyes: Negative.   Respiratory: Negative.   Cardiovascular: Negative.   Gastrointestinal: Negative.   Endocrine: Negative.   Genitourinary: Negative.   Musculoskeletal: Negative.   Skin: Negative.   Allergic/Immunologic: Negative.   Neurological: Positive for weakness.  Hematological: Negative.   Psychiatric/Behavioral: The patient is nervous/anxious.        Objective:   Physical Exam  Constitutional: She is oriented to person, place, and time.  Elderly but alert  HENT:  Head: Normocephalic and atraumatic.  Nose: Nose normal.  Mouth/Throat: Oropharynx is clear and moist. No oropharyngeal exudate.  Bilateral ear cerumen  Eyes: Conjunctivae and EOM are normal. Pupils are equal, round, and reactive to light. Right eye exhibits no discharge. Left eye exhibits no discharge. No scleral icterus.  Neck: Normal range of motion. Neck supple. No thyromegaly present.  Radiated bruit  from heart  Cardiovascular: Normal rate and regular rhythm.  Exam reveals no gallop and no friction rub.   Murmur heard. Pulmonary/Chest: Effort normal and breath sounds normal. No respiratory distress. She has no wheezes. She has no rales.  Abdominal: Soft. Bowel sounds are normal. She exhibits no mass. There is no tenderness. There is no rebound and no guarding.  Musculoskeletal: She exhibits no edema.  Patient is in a wheelchair and uses a cane  and walker at home for ambulation  Lymphadenopathy:    She has no cervical adenopathy.  Neurological: She is alert and oriented to person, place, and time. She has normal reflexes. No cranial nerve deficit.  Skin: Skin is warm and dry. No rash noted.  Psychiatric: She has a normal mood and affect. Her behavior is normal. Judgment and thought content normal.  Nursing note and vitals reviewed.  BP 138/74 (BP Location: Right Arm)   Pulse 72   Temp 97 F (36.1 C) (Oral)   Ht 5' 5"  (1.651 m)   Wt 136 lb (61.7 kg)   BMI 22.63 kg/m         Assessment & Plan:  1. Weakness -Patient is reassured and labs will be done to make sure that everything is stable -She will discontinue the Vesicare and not take any more of this medication - BMP8+EGFR - Thyroid Panel With TSH - CBC with Differential/Platelet  2. Chronic atrial fibrillation (HCC) -The patient is in normal sinus rhythm today and continues to take warfarin  3. MITRAL REGURGITATION -Follow-up with cardiology as planned  4. Incontinence in female -Reaction to Vesicare even though it did help the patient's symptoms of side effects were not tolerable  5. Medication reaction, initial encounter -No more Vesicare due to anxiety induced by taking the medication. -She will continue to drink plenty of fluids and stay well hydrated. -She will use a pad for her incontinence  Patient Instructions  The patient should continue to avoid caffeine She should wear a pad for her incontinence She should continue to stay off the Vesicare She should continue to drink plenty of fluids and stay well hydrated If she is interested in the future we could try Myrbetriq but this patient has always been sensitive to medicines and I do not think she will be willing to try any other medication for her incontinence.  Arrie Senate MD

## 2016-02-20 NOTE — Patient Instructions (Signed)
The patient should continue to avoid caffeine She should wear a pad for her incontinence She should continue to stay off the Vesicare She should continue to drink plenty of fluids and stay well hydrated If she is interested in the future we could try Myrbetriq but this patient has always been sensitive to medicines and I do not think she will be willing to try any other medication for her incontinence.

## 2016-02-21 ENCOUNTER — Other Ambulatory Visit: Payer: Self-pay | Admitting: *Deleted

## 2016-02-21 DIAGNOSIS — R899 Unspecified abnormal finding in specimens from other organs, systems and tissues: Secondary | ICD-10-CM

## 2016-02-21 LAB — CBC WITH DIFFERENTIAL/PLATELET
BASOS: 1 %
Basophils Absolute: 0 10*3/uL (ref 0.0–0.2)
EOS (ABSOLUTE): 0.1 10*3/uL (ref 0.0–0.4)
EOS: 2 %
HEMOGLOBIN: 10.7 g/dL — AB (ref 11.1–15.9)
Hematocrit: 32 % — ABNORMAL LOW (ref 34.0–46.6)
IMMATURE GRANS (ABS): 0 10*3/uL (ref 0.0–0.1)
Immature Granulocytes: 0 %
LYMPHS: 22 %
Lymphocytes Absolute: 1 10*3/uL (ref 0.7–3.1)
MCH: 29.7 pg (ref 26.6–33.0)
MCHC: 33.4 g/dL (ref 31.5–35.7)
MCV: 89 fL (ref 79–97)
MONOCYTES: 16 %
Monocytes Absolute: 0.7 10*3/uL (ref 0.1–0.9)
NEUTROS ABS: 2.7 10*3/uL (ref 1.4–7.0)
Neutrophils: 59 %
PLATELETS: 209 10*3/uL (ref 150–379)
RBC: 3.6 x10E6/uL — ABNORMAL LOW (ref 3.77–5.28)
RDW: 14.9 % (ref 12.3–15.4)
WBC: 4.5 10*3/uL (ref 3.4–10.8)

## 2016-02-21 LAB — THYROID PANEL WITH TSH
Free Thyroxine Index: 1.3 (ref 1.2–4.9)
T3 UPTAKE RATIO: 27 % (ref 24–39)
T4, Total: 4.8 ug/dL (ref 4.5–12.0)
TSH: 4.96 u[IU]/mL — ABNORMAL HIGH (ref 0.450–4.500)

## 2016-02-21 LAB — BMP8+EGFR
BUN / CREAT RATIO: 21 (ref 12–28)
BUN: 17 mg/dL (ref 10–36)
CALCIUM: 9.7 mg/dL (ref 8.7–10.3)
CHLORIDE: 96 mmol/L (ref 96–106)
CO2: 28 mmol/L (ref 18–29)
Creatinine, Ser: 0.81 mg/dL (ref 0.57–1.00)
GFR, EST AFRICAN AMERICAN: 71 mL/min/{1.73_m2} (ref >59)
GFR, EST NON AFRICAN AMERICAN: 61 mL/min/{1.73_m2} (ref >59)
Glucose: 128 mg/dL — ABNORMAL HIGH (ref 65–99)
POTASSIUM: 4.3 mmol/L (ref 3.5–5.2)
SODIUM: 138 mmol/L (ref 134–144)

## 2016-02-21 NOTE — Addendum Note (Signed)
Addended by: Zannie Cove on: 02/21/2016 03:08 PM   Modules accepted: Orders

## 2016-02-23 LAB — HGB A1C W/O EAG: HEMOGLOBIN A1C: 6.1 % — AB (ref 4.8–5.6)

## 2016-02-23 LAB — SPECIMEN STATUS REPORT

## 2016-02-28 ENCOUNTER — Encounter: Payer: Medicare Other | Admitting: *Deleted

## 2016-02-29 ENCOUNTER — Ambulatory Visit (INDEPENDENT_AMBULATORY_CARE_PROVIDER_SITE_OTHER): Payer: Medicare Other | Admitting: *Deleted

## 2016-02-29 ENCOUNTER — Encounter: Payer: Self-pay | Admitting: Internal Medicine

## 2016-02-29 DIAGNOSIS — I482 Chronic atrial fibrillation, unspecified: Secondary | ICD-10-CM

## 2016-02-29 DIAGNOSIS — I442 Atrioventricular block, complete: Secondary | ICD-10-CM | POA: Diagnosis not present

## 2016-02-29 LAB — CUP PACEART INCLINIC DEVICE CHECK
Battery Remaining Longevity: 148.8
Battery Voltage: 2.98 V
Brady Statistic RV Percent Paced: 99.92 %
Date Time Interrogation Session: 20170804121527
Implantable Lead Implant Date: 20020418
Implantable Lead Location: 753860
Lead Channel Pacing Threshold Amplitude: 0.75 V
Lead Channel Pacing Threshold Pulse Width: 0.4 ms
MDC IDC MSMT LEADCHNL RV IMPEDANCE VALUE: 425 Ohm
MDC IDC MSMT LEADCHNL RV PACING THRESHOLD AMPLITUDE: 0.75 V
MDC IDC MSMT LEADCHNL RV PACING THRESHOLD PULSEWIDTH: 0.4 ms
MDC IDC PG SERIAL: 7331332
MDC IDC SET LEADCHNL RV PACING AMPLITUDE: 0.875
MDC IDC SET LEADCHNL RV PACING PULSEWIDTH: 0.4 ms
MDC IDC SET LEADCHNL RV SENSING SENSITIVITY: 4 mV

## 2016-02-29 NOTE — Progress Notes (Signed)
Pacemaker check in clinic. Normal device function. Threshold and impedance consistent with previous measurements. Device programmed to maximize longevity. No high ventricular rates noted. Device programmed at appropriate safety margins. Histogram distribution appropriate for patient activity level. Device programmed to optimize intrinsic conduction. Estimated longevity 12.4-13.2 years. Patient will follow up via [facility] on 11/6 and with [facility] in 08-2016.

## 2016-03-11 ENCOUNTER — Telehealth: Payer: Self-pay | Admitting: Pharmacist

## 2016-03-11 MED ORDER — POLYETHYLENE GLYCOL 3350 17 GM/SCOOP PO POWD: 17.0000 g | Freq: Every day | ORAL | 1 refills | Status: DC

## 2016-03-11 NOTE — Telephone Encounter (Signed)
Refill sent to [facility]

## 2016-03-13 ENCOUNTER — Other Ambulatory Visit: Payer: Medicare Other

## 2016-03-13 ENCOUNTER — Telehealth: Payer: Self-pay | Admitting: Family Medicine

## 2016-03-13 DIAGNOSIS — R300 Dysuria: Secondary | ICD-10-CM

## 2016-03-13 DIAGNOSIS — R350 Frequency of micturition: Secondary | ICD-10-CM

## 2016-03-13 LAB — URINALYSIS, COMPLETE
Bilirubin, UA: NEGATIVE
GLUCOSE, UA: NEGATIVE
KETONES UA: NEGATIVE
LEUKOCYTES UA: NEGATIVE
NITRITE UA: NEGATIVE
Protein, UA: NEGATIVE
RBC, UA: NEGATIVE
SPEC GRAV UA: 1.015 (ref 1.005–1.030)
Urobilinogen, Ur: 0.2 mg/dL (ref 0.2–1.0)
pH, UA: 7 (ref 5.0–7.5)

## 2016-03-13 LAB — MICROSCOPIC EXAMINATION

## 2016-03-13 NOTE — Telephone Encounter (Signed)
Per Dr Laurance Flatten okay for pt to bring in UA Order entered in [place] Pt's daughter notified

## 2016-03-13 NOTE — Telephone Encounter (Signed)
error 

## 2016-03-13 NOTE — Addendum Note (Signed)
Addended by: Pollyann Kennedy F on: 03/13/2016 11:43 AM   Modules accepted: Orders

## 2016-03-14 LAB — URINE CULTURE

## 2016-03-17 ENCOUNTER — Other Ambulatory Visit: Payer: Self-pay | Admitting: Pharmacist

## 2016-03-17 MED ORDER — POLYETHYLENE GLYCOL 3350 17 GM/SCOOP PO POWD: 17.0000 g | Freq: Two times a day (BID) | ORAL | 1 refills | Status: DC

## 2016-03-17 NOTE — Telephone Encounter (Signed)
rx sent to [facility] for Miralax 17 gram bid for 90 day supply

## 2016-03-24 ENCOUNTER — Encounter: Payer: Self-pay | Admitting: Pharmacist

## 2016-03-27 ENCOUNTER — Ambulatory Visit (INDEPENDENT_AMBULATORY_CARE_PROVIDER_SITE_OTHER): Payer: Medicare Other | Admitting: Pharmacist

## 2016-03-27 DIAGNOSIS — R899 Unspecified abnormal finding in specimens from other organs, systems and tissues: Secondary | ICD-10-CM | POA: Diagnosis not present

## 2016-03-27 DIAGNOSIS — I482 Chronic atrial fibrillation, unspecified: Secondary | ICD-10-CM

## 2016-03-27 DIAGNOSIS — R946 Abnormal results of thyroid function studies: Secondary | ICD-10-CM | POA: Diagnosis not present

## 2016-03-27 DIAGNOSIS — Z7901 Long term (current) use of anticoagulants: Secondary | ICD-10-CM | POA: Diagnosis not present

## 2016-03-27 DIAGNOSIS — R7989 Other specified abnormal findings of blood chemistry: Secondary | ICD-10-CM

## 2016-03-27 LAB — COAGUCHEK XS/INR WAIVED
INR: 2.7 — ABNORMAL HIGH (ref 0.9–1.1)
PROTHROMBIN TIME: 32.5 s

## 2016-03-28 LAB — CBC WITH DIFFERENTIAL/PLATELET
BASOS ABS: 0 10*3/uL (ref 0.0–0.2)
Basos: 1 %
EOS (ABSOLUTE): 0.1 10*3/uL (ref 0.0–0.4)
Eos: 2 %
HEMOGLOBIN: 11.5 g/dL (ref 11.1–15.9)
Hematocrit: 33.9 % — ABNORMAL LOW (ref 34.0–46.6)
IMMATURE GRANS (ABS): 0 10*3/uL (ref 0.0–0.1)
Immature Granulocytes: 0 %
LYMPHS: 33 %
Lymphocytes Absolute: 1.6 10*3/uL (ref 0.7–3.1)
MCH: 29.9 pg (ref 26.6–33.0)
MCHC: 33.9 g/dL (ref 31.5–35.7)
MCV: 88 fL (ref 79–97)
MONOCYTES: 13 %
Monocytes Absolute: 0.7 10*3/uL (ref 0.1–0.9)
NEUTROS ABS: 2.5 10*3/uL (ref 1.4–7.0)
Neutrophils: 51 %
Platelets: 212 10*3/uL (ref 150–379)
RBC: 3.85 x10E6/uL (ref 3.77–5.28)
RDW: 14.4 % (ref 12.3–15.4)
WBC: 5 10*3/uL (ref 3.4–10.8)

## 2016-03-28 LAB — THYROID PANEL WITH TSH
FREE THYROXINE INDEX: 1.1 — AB (ref 1.2–4.9)
T3 Uptake Ratio: 25 % (ref 24–39)
T4, Total: 4.4 ug/dL — ABNORMAL LOW (ref 4.5–12.0)
TSH: 6.2 u[IU]/mL — ABNORMAL HIGH (ref 0.450–4.500)

## 2016-03-28 MED ORDER — LEVOTHYROXINE SODIUM 25 MCG PO TABS: 25.0000 ug | ORAL_TABLET | Freq: Every day | ORAL | 2 refills | Status: DC

## 2016-03-28 NOTE — Addendum Note (Signed)
Addended by: Thana Ates on: 03/28/2016 02:48 PM   Modules accepted: Orders

## 2016-04-10 DIAGNOSIS — M79676 Pain in unspecified toe(s): Secondary | ICD-10-CM | POA: Diagnosis not present

## 2016-04-10 DIAGNOSIS — B351 Tinea unguium: Secondary | ICD-10-CM | POA: Diagnosis not present

## 2016-05-01 DIAGNOSIS — H538 Other visual disturbances: Secondary | ICD-10-CM | POA: Diagnosis not present

## 2016-05-01 DIAGNOSIS — Z961 Presence of intraocular lens: Secondary | ICD-10-CM | POA: Diagnosis not present

## 2016-05-08 ENCOUNTER — Ambulatory Visit (INDEPENDENT_AMBULATORY_CARE_PROVIDER_SITE_OTHER): Payer: Medicare Other | Admitting: Pharmacist

## 2016-05-08 DIAGNOSIS — R300 Dysuria: Secondary | ICD-10-CM

## 2016-05-08 DIAGNOSIS — I482 Chronic atrial fibrillation, unspecified: Secondary | ICD-10-CM

## 2016-05-08 DIAGNOSIS — Z23 Encounter for immunization: Secondary | ICD-10-CM

## 2016-05-08 DIAGNOSIS — Z7901 Long term (current) use of anticoagulants: Secondary | ICD-10-CM

## 2016-05-08 LAB — URINALYSIS, COMPLETE
Bilirubin, UA: NEGATIVE
GLUCOSE, UA: NEGATIVE
Ketones, UA: NEGATIVE
Leukocytes, UA: NEGATIVE
Nitrite, UA: NEGATIVE
PROTEIN UA: NEGATIVE
RBC, UA: NEGATIVE
Specific Gravity, UA: 1.015 (ref 1.005–1.030)
UUROB: 0.2 mg/dL (ref 0.2–1.0)
pH, UA: 7 (ref 5.0–7.5)

## 2016-05-08 LAB — MICROSCOPIC EXAMINATION

## 2016-05-08 LAB — COAGUCHEK XS/INR WAIVED
INR: 1.7 — AB (ref 0.9–1.1)
PROTHROMBIN TIME: 20.8 s

## 2016-05-10 LAB — URINE CULTURE

## 2016-05-20 ENCOUNTER — Telehealth: Payer: Self-pay | Admitting: Family Medicine

## 2016-05-20 NOTE — Telephone Encounter (Signed)
[name] reports that her mother fell a few days ago.  No injury.  [name] feels that her leg weakness and falling is related to the levothyroxine she started about 6-8 weeks ago.   Told her to hold levothyroxine until she sees Dr Laurance Flatten 05/28/16 (8 days away)

## 2016-05-29 ENCOUNTER — Ambulatory Visit (INDEPENDENT_AMBULATORY_CARE_PROVIDER_SITE_OTHER): Payer: Medicare Other

## 2016-05-29 ENCOUNTER — Ambulatory Visit (INDEPENDENT_AMBULATORY_CARE_PROVIDER_SITE_OTHER): Payer: Medicare Other | Admitting: Family Medicine

## 2016-05-29 ENCOUNTER — Encounter: Payer: Self-pay | Admitting: Family Medicine

## 2016-05-29 VITALS — BP 127/73 | HR 69 | Temp 97.2°F | Ht 65.0 in | Wt 132.0 lb

## 2016-05-29 DIAGNOSIS — M79605 Pain in left leg: Secondary | ICD-10-CM

## 2016-05-29 DIAGNOSIS — E559 Vitamin D deficiency, unspecified: Secondary | ICD-10-CM | POA: Diagnosis not present

## 2016-05-29 DIAGNOSIS — R531 Weakness: Secondary | ICD-10-CM | POA: Diagnosis not present

## 2016-05-29 DIAGNOSIS — M79604 Pain in right leg: Secondary | ICD-10-CM

## 2016-05-29 DIAGNOSIS — R29898 Other symptoms and signs involving the musculoskeletal system: Secondary | ICD-10-CM

## 2016-05-29 DIAGNOSIS — I10 Essential (primary) hypertension: Secondary | ICD-10-CM | POA: Diagnosis not present

## 2016-05-29 DIAGNOSIS — I482 Chronic atrial fibrillation, unspecified: Secondary | ICD-10-CM

## 2016-05-29 DIAGNOSIS — I700 Atherosclerosis of aorta: Secondary | ICD-10-CM | POA: Insufficient documentation

## 2016-05-29 DIAGNOSIS — E7800 Pure hypercholesterolemia, unspecified: Secondary | ICD-10-CM

## 2016-05-29 LAB — COAGUCHEK XS/INR WAIVED
INR: 1.7 — ABNORMAL HIGH (ref 0.9–1.1)
PROTHROMBIN TIME: 20.3 s

## 2016-05-29 MED ORDER — POLYETHYLENE GLYCOL 3350 17 GM/SCOOP PO POWD: 17.0000 g | Freq: Two times a day (BID) | ORAL | 3 refills | Status: DC

## 2016-05-29 NOTE — Progress Notes (Signed)
Subjective:    Patient ID: [name], female    DOB: 03-07-1917, 82 y.o.   MRN: [id number]  HPI Pt here for follow up and management of chronic medical problems which includes hyperlipidemia, a fib and hypertension. She is taking medications regularly.The patient today complains of weakness and arthralgias in her legs. The INR today had a pro time of 1.7 and the clinical pharmacist will address this. Patient comes to the visit today with her daughter. Her biggest complaint is that her legs feel weak. She does not have that much pain is just a weakness in her legs. She does use a walker around the house. She denies any chest pain or shortness of breath anymore than usual. She denies any problems with her stomach nausea vomiting diarrhea blood in the stool or black tarry bowel movements. She is passing her water well and does not have good control at her age and wears pads to keep her dry as much as possible.     Patient Active Problem List   Diagnosis Date Noted  . Ventral hernia 12/08/2013  . Vitamin D deficiency 12/08/2013  . Breast cancer ([place]) 12/08/2013  . Long term (current) use of anticoagulants 05/05/2013  . Shortness of breath 05/02/2013  . Restless leg syndrome 10/12/2010  . CAROTID BRUIT 08/21/2010  . MITRAL REGURGITATION 08/01/2009  . HYPERTENSION, BENIGN 08/17/2008  . AV BLOCK, COMPLETE 08/17/2008  . ATRIAL FIBRILLATION 08/17/2008  . PACEMAKER, PERMANENT 08/17/2008   Outpatient Encounter Prescriptions as of 05/29/2016  Medication Sig  . b complex vitamins tablet Take 1 tablet by mouth daily.   . Cholecalciferol (VITAMIN D3) 1000 UNITS tablet Take 2,000 Units by mouth daily.    . Fe Fum-FePoly-Vit C-Vit B3 (INTEGRA) 62.5-62.5-40-3 MG CAPS TAKE ONE CAPSULE BY MOUTH ONE TIME DAILY  . fish oil-omega-3 fatty acids 1000 MG capsule Take 2 g by mouth daily.  . polyethylene glycol powder (GLYCOLAX/MIRALAX) powder Take 17 g by mouth 2 (two) times daily.  . vitamin C  (ASCORBIC ACID) 500 MG tablet Take 500 mg by mouth daily.    . vitamin E 400 UNIT capsule Take 400 Units by mouth daily.  Marland Kitchen warfarin (COUMADIN) 5 MG tablet TAKE 1/2 OR 1 TABLET BY MOUTH ONCE A DAY OR AS INSTRUCTED BY COUMADIN ANTICOAGULATION CLINIC  . [DISCONTINUED] levothyroxine (SYNTHROID, LEVOTHROID) 25 MCG tablet Take 1 tablet (25 mcg total) by mouth daily before breakfast.  . furosemide (LASIX) 20 MG tablet Take 1 tablet (20 mg total) by mouth daily as needed.   No facility-administered encounter medications on file as of 05/29/2016.        Review of Systems  Constitutional: Negative.   HENT: Negative.   Eyes: Negative.   Respiratory: Negative.   Cardiovascular: Negative.   Gastrointestinal: Negative.   Endocrine: Negative.   Genitourinary: Negative.   Musculoskeletal: Positive for arthralgias (legs are weak and painful).  Skin: Negative.   Allergic/Immunologic: Negative.   Neurological: Negative.   Hematological: Negative.   Psychiatric/Behavioral: Negative.        Objective:   Physical Exam  Constitutional: She is oriented to person, place, and time. She appears well-developed and well-nourished. No distress.  Elderly female alert and looking younger than her stated age of 86 years  HENT:  Head: Normocephalic and atraumatic.  Right Ear: External ear normal.  Left Ear: External ear normal.  Nose: Nose normal.  Mouth/Throat: Oropharynx is clear and moist.  Eyes: Conjunctivae and EOM are normal. Pupils are equal, round,  and reactive to light. Right eye exhibits no discharge. Left eye exhibits no discharge. No scleral icterus.  Neck: Normal range of motion. Neck supple. No thyromegaly present.  No bruits or thyromegaly  Cardiovascular: Normal rate, regular rhythm, normal heart sounds and intact distal pulses.   No murmur heard. The heart has a regular rate and rhythm today at 72/m  Pulmonary/Chest: Effort normal and breath sounds normal. No respiratory distress. She has  no wheezes. She has no rales.  Clear anteriorly and posteriorly  Abdominal: Soft. Bowel sounds are normal. She exhibits no mass. There is no tenderness. There is no rebound and no guarding.  No liver or spleen enlargement no abdominal tenderness and no bruit  Musculoskeletal: Normal range of motion. She exhibits no edema.  Good range of motion of both lower extremities and no back tenderness to palpation  Lymphadenopathy:    She has no cervical adenopathy.  Neurological: She is alert and oriented to person, place, and time.  Skin: Skin is warm and dry. No rash noted.  Psychiatric: She has a normal mood and affect. Her behavior is normal. Judgment and thought content normal.  Nursing note and vitals reviewed.  BP 127/73 (BP Location: Left Arm)   Pulse 69   Temp 97.2 F (36.2 C) (Oral)   Ht 5' 5"  (1.651 m)   Wt 132 lb (59.9 kg)   BMI 21.97 kg/m   LS spine films with results pending      Assessment & Plan:  1. HYPERTENSION, BENIGN -The blood pressure is good today and there is no change in treatment - BMP8+EGFR - CBC with Differential/Platelet - Hepatic function panel  2. Pure hypercholesterolemia -Continue aggressive therapeutic lifestyle changes and omega-3 fatty acids. - CBC with Differential/Platelet - Lipid panel  3. Chronic atrial fibrillation (HCC) -Continue with Coumadin as recommended by clinical pharmacy - CoaguChek XS/INR Waived - CBC with Differential/Platelet  4. Vitamin D deficiency -Continue calcium and vitamin D replacement - CBC with Differential/Platelet - VITAMIN D 25 Hydroxy (Vit-D Deficiency, Fractures)  5. Bilateral leg pain -Check for the possibility of spinal stenosis - DG Lumbar Spine 2-3 Views; Future  6. Bilateral leg weakness -The patient has good circulation and we will check for spinal stenosis - DG Lumbar Spine 2-3 Views; Future  7. Generalized weakness -Use a walker at home and did not put herself at risk for falling  Meds  ordered this encounter  Medications  . polyethylene glycol powder (GLYCOLAX/MIRALAX) powder    Sig: Take 17 g by mouth 2 (two) times daily.    Dispense:  3468 g    Refill:  3   Patient Instructions                       Medicare Annual Wellness Visit  [facility] and the medical providers at [facility] strive to bring you the best medical care.  In doing so we not only want to address your current medical conditions and concerns but also to detect new conditions early and prevent illness, disease and health-related problems.    Medicare offers a yearly Wellness Visit which allows our clinical staff to assess your need for preventative services including immunizations, lifestyle education, counseling to decrease risk of preventable diseases and screening for fall risk and other medical concerns.    This visit is provided free of charge (no copay) for all Medicare recipients. The clinical pharmacists at [facility] have begun to conduct these  Wellness Visits which will also include a thorough review of all your medications.    As you primary medical provider recommend that you make an appointment for your Annual Wellness Visit if you have not done so already this year.  You may set up this appointment before you leave today or you may call back [phone]) and schedule an appointment.  Please make sure when you call that you mention that you are scheduling your Annual Wellness Visit with the clinical pharmacist so that the appointment may be made for the proper length of time.     Continue current medications. Continue good therapeutic lifestyle changes which include good diet and exercise. Fall precautions discussed with patient. If an FOBT was given today- please return it to our front desk. If you are over 16 years old - you may need Prevnar 39 or the adult Pneumonia vaccine.  **Flu shots are available--- please call and schedule a FLU-CLINIC  appointment**  After your visit with [place] today you will receive a survey in the mail or online from [employer] regarding your care with [place]. Please take a moment to fill this out. Your feedback is very important to [place] as you can help [place] better understand your patient needs as well as improve your experience and satisfaction. WE CARE ABOUT YOU!!!   Continue to be careful and do not put yourself at risk for falling We'll call you with the lab work results and with the x-ray results as soon as they become available  Arrie Senate MD

## 2016-05-29 NOTE — Patient Instructions (Addendum)
Medicare Annual Wellness Visit  [facility] and the medical providers at [facility] strive to bring you the best medical care.  In doing so we not only want to address your current medical conditions and concerns but also to detect new conditions early and prevent illness, disease and health-related problems.    Medicare offers a yearly Wellness Visit which allows our clinical staff to assess your need for preventative services including immunizations, lifestyle education, counseling to decrease risk of preventable diseases and screening for fall risk and other medical concerns.    This visit is provided free of charge (no copay) for all Medicare recipients. The clinical pharmacists at [facility] have begun to conduct these Wellness Visits which will also include a thorough review of all your medications.    As you primary medical provider recommend that you make an appointment for your Annual Wellness Visit if you have not done so already this year.  You may set up this appointment before you leave today or you may call back [phone] and schedule an appointment.  Please make sure when you call that you mention that you are scheduling your Annual Wellness Visit with the clinical pharmacist so that the appointment may be made for the proper length of time.     Continue current medications. Continue good therapeutic lifestyle changes which include good diet and exercise. Fall precautions discussed with patient. If an FOBT was given today- please return it to our front desk. If you are over 55 years old - you may need Prevnar 59 or the adult Pneumonia vaccine.  **Flu shots are available--- please call and schedule a FLU-CLINIC appointment**  After your visit with [place] today you will receive a survey in the mail or online from [employer] regarding your care with [place]. Please take a moment to fill this out. Your feedback is very  important to [place] as you can help [place] better understand your patient needs as well as improve your experience and satisfaction. WE CARE ABOUT YOU!!!   Continue to be careful and do not put yourself at risk for falling We'll call you with the lab work results and with the x-ray results as soon as they become available

## 2016-05-30 LAB — CBC WITH DIFFERENTIAL/PLATELET
BASOS ABS: 0.1 10*3/uL (ref 0.0–0.2)
BASOS: 1 %
EOS (ABSOLUTE): 0.1 10*3/uL (ref 0.0–0.4)
EOS: 1 %
HEMATOCRIT: 31.2 % — AB (ref 34.0–46.6)
HEMOGLOBIN: 10.5 g/dL — AB (ref 11.1–15.9)
IMMATURE GRANS (ABS): 0 10*3/uL (ref 0.0–0.1)
Immature Granulocytes: 0 %
LYMPHS ABS: 1.5 10*3/uL (ref 0.7–3.1)
LYMPHS: 29 %
MCH: 29.2 pg (ref 26.6–33.0)
MCHC: 33.7 g/dL (ref 31.5–35.7)
MCV: 87 fL (ref 79–97)
MONOCYTES: 14 %
Monocytes Absolute: 0.7 10*3/uL (ref 0.1–0.9)
NEUTROS ABS: 2.8 10*3/uL (ref 1.4–7.0)
Neutrophils: 55 %
Platelets: 210 10*3/uL (ref 150–379)
RBC: 3.59 x10E6/uL — ABNORMAL LOW (ref 3.77–5.28)
RDW: 13.8 % (ref 12.3–15.4)
WBC: 5.1 10*3/uL (ref 3.4–10.8)

## 2016-05-30 LAB — BMP8+EGFR
BUN / CREAT RATIO: 24 (ref 12–28)
BUN: 22 mg/dL (ref 10–36)
CALCIUM: 9.9 mg/dL (ref 8.7–10.3)
CHLORIDE: 95 mmol/L — AB (ref 96–106)
CO2: 28 mmol/L (ref 18–29)
Creatinine, Ser: 0.91 mg/dL (ref 0.57–1.00)
GFR, EST AFRICAN AMERICAN: 62 mL/min/{1.73_m2} (ref >59)
GFR, EST NON AFRICAN AMERICAN: 53 mL/min/{1.73_m2} — AB (ref >59)
Glucose: 91 mg/dL (ref 65–99)
Potassium: 4.3 mmol/L (ref 3.5–5.2)
Sodium: 137 mmol/L (ref 134–144)

## 2016-05-30 LAB — HEPATIC FUNCTION PANEL
ALBUMIN: 4.2 g/dL (ref 3.2–4.6)
ALK PHOS: 54 IU/L (ref 39–117)
ALT: 13 IU/L (ref 0–32)
AST: 19 IU/L (ref 0–40)
BILIRUBIN TOTAL: 1 mg/dL (ref 0.0–1.2)
Bilirubin, Direct: 0.25 mg/dL (ref 0.00–0.40)
TOTAL PROTEIN: 6.9 g/dL (ref 6.0–8.5)

## 2016-05-30 LAB — LIPID PANEL
CHOL/HDL RATIO: 3.8 ratio (ref 0.0–4.4)
Cholesterol, Total: 217 mg/dL — ABNORMAL HIGH (ref 100–199)
HDL: 57 mg/dL (ref >39)
LDL CALC: 145 mg/dL — AB (ref 0–99)
TRIGLYCERIDES: 73 mg/dL (ref 0–149)
VLDL Cholesterol Cal: 15 mg/dL (ref 5–40)

## 2016-05-30 LAB — VITAMIN D 25 HYDROXY (VIT D DEFICIENCY, FRACTURES): Vit D, 25-Hydroxy: 43.6 ng/mL (ref 30.0–100.0)

## 2016-06-02 ENCOUNTER — Telehealth: Payer: Self-pay | Admitting: Cardiology

## 2016-06-02 ENCOUNTER — Ambulatory Visit (INDEPENDENT_AMBULATORY_CARE_PROVIDER_SITE_OTHER): Payer: Medicare Other | Admitting: *Deleted

## 2016-06-02 DIAGNOSIS — I442 Atrioventricular block, complete: Secondary | ICD-10-CM

## 2016-06-02 NOTE — Telephone Encounter (Signed)
Confirmed remote transmission w/ pt daughter.   

## 2016-06-03 NOTE — Progress Notes (Signed)
Remote pacemaker transmission.   

## 2016-06-04 ENCOUNTER — Encounter: Payer: Self-pay | Admitting: Cardiology

## 2016-06-15 LAB — CUP PACEART REMOTE DEVICE CHECK
Battery Remaining Percentage: 95.5 %
Battery Voltage: 2.98 V
Lead Channel Impedance Value: 440 Ohm
Lead Channel Sensing Intrinsic Amplitude: 12 mV
Lead Channel Setting Pacing Pulse Width: 0.4 ms
MDC IDC LEAD IMPLANT DT: 20020418
MDC IDC LEAD LOCATION: 753860
MDC IDC MSMT BATTERY REMAINING LONGEVITY: 154 mo
MDC IDC MSMT LEADCHNL RV PACING THRESHOLD AMPLITUDE: 0.625 V
MDC IDC MSMT LEADCHNL RV PACING THRESHOLD PULSEWIDTH: 0.4 ms
MDC IDC PG IMPLANT DT: 20131226
MDC IDC SESS DTM: 20171106234156
MDC IDC SET LEADCHNL RV PACING AMPLITUDE: 0.875
MDC IDC SET LEADCHNL RV SENSING SENSITIVITY: 4 mV
MDC IDC STAT BRADY RV PERCENT PACED: 99 %
Pulse Gen Model: 1110
Pulse Gen Serial Number: 7331332

## 2016-06-18 ENCOUNTER — Encounter: Payer: Self-pay | Admitting: Pharmacist

## 2016-06-25 ENCOUNTER — Telehealth: Payer: Self-pay | Admitting: Pharmacist

## 2016-06-25 ENCOUNTER — Ambulatory Visit (INDEPENDENT_AMBULATORY_CARE_PROVIDER_SITE_OTHER): Payer: Medicare Other | Admitting: Pharmacist

## 2016-06-25 DIAGNOSIS — Z7901 Long term (current) use of anticoagulants: Secondary | ICD-10-CM

## 2016-06-25 DIAGNOSIS — I482 Chronic atrial fibrillation, unspecified: Secondary | ICD-10-CM

## 2016-06-25 LAB — COAGUCHEK XS/INR WAIVED
INR: 1.8 — AB (ref 0.9–1.1)
Prothrombin Time: 21.1 s

## 2016-06-25 NOTE — Telephone Encounter (Signed)
Daughter wants faxed to [facility]

## 2016-06-25 NOTE — Patient Instructions (Signed)
Anticoagulation Dose Instructions as of 06/25/2016      Dorene Grebe Tue Wed Thu Fri Sat   New Dose 5 mg 2.5 mg 5 mg 5 mg 5 mg 2.5 mg 5 mg    Description   Take an extra 1/2 tablet today - Wednesday, November 29th.  Then continue current warfarin dose of 1/2 tablet on Mondays and Fridays.  Take 1 tablet all other days.     INR was 1.8 today.

## 2016-07-03 ENCOUNTER — Other Ambulatory Visit: Payer: Self-pay | Admitting: Family Medicine

## 2016-07-14 ENCOUNTER — Telehealth: Payer: Self-pay | Admitting: Family Medicine

## 2016-07-15 ENCOUNTER — Telehealth: Payer: Self-pay | Admitting: Family Medicine

## 2016-07-16 NOTE — Telephone Encounter (Signed)
Was sent 07/08/16

## 2016-07-17 NOTE — Telephone Encounter (Signed)
Fixed and refaxed

## 2016-07-24 ENCOUNTER — Ambulatory Visit (INDEPENDENT_AMBULATORY_CARE_PROVIDER_SITE_OTHER): Payer: Medicare Other | Admitting: Pharmacist

## 2016-07-24 DIAGNOSIS — I482 Chronic atrial fibrillation, unspecified: Secondary | ICD-10-CM

## 2016-07-24 DIAGNOSIS — Z7901 Long term (current) use of anticoagulants: Secondary | ICD-10-CM

## 2016-07-24 LAB — COAGUCHEK XS/INR WAIVED
INR: 2 — AB (ref 0.9–1.1)
Prothrombin Time: 23.5 s

## 2016-08-25 ENCOUNTER — Telehealth: Payer: Self-pay | Admitting: Family Medicine

## 2016-08-25 MED ORDER — OSELTAMIVIR PHOSPHATE 75 MG PO CAPS: 75.0000 mg | ORAL_CAPSULE | Freq: Every day | ORAL | 0 refills | Status: DC

## 2016-08-25 NOTE — Telephone Encounter (Signed)
start Tamiflu 75 mg 1 daily for 10 days

## 2016-08-25 NOTE — Telephone Encounter (Signed)
How close was the contact and when did it occur?

## 2016-08-25 NOTE — Telephone Encounter (Signed)
Patients daughter aware and rx sent to pharmacy.

## 2016-08-25 NOTE — Telephone Encounter (Signed)
Her daughter [name] was diagnosed with the flu yesterday and she lives with her. Please advise

## 2016-08-28 ENCOUNTER — Encounter: Payer: Self-pay | Admitting: Pharmacist

## 2016-09-01 ENCOUNTER — Encounter: Payer: Self-pay | Admitting: Family Medicine

## 2016-09-11 ENCOUNTER — Ambulatory Visit (INDEPENDENT_AMBULATORY_CARE_PROVIDER_SITE_OTHER): Payer: Medicare Other | Admitting: Pharmacist

## 2016-09-11 DIAGNOSIS — I482 Chronic atrial fibrillation, unspecified: Secondary | ICD-10-CM

## 2016-09-11 DIAGNOSIS — Z7901 Long term (current) use of anticoagulants: Secondary | ICD-10-CM | POA: Diagnosis not present

## 2016-09-11 LAB — COAGUCHEK XS/INR WAIVED
INR: 1.9 — AB (ref 0.9–1.1)
Prothrombin Time: 23.3 s

## 2016-10-16 ENCOUNTER — Encounter: Payer: Self-pay | Admitting: Pharmacist

## 2016-10-20 ENCOUNTER — Telehealth: Payer: Self-pay | Admitting: Family Medicine

## 2016-10-20 ENCOUNTER — Ambulatory Visit (INDEPENDENT_AMBULATORY_CARE_PROVIDER_SITE_OTHER): Payer: Medicare Other | Admitting: Nurse Practitioner

## 2016-10-20 ENCOUNTER — Encounter: Payer: Medicare Other | Admitting: Pharmacist

## 2016-10-20 ENCOUNTER — Ambulatory Visit (INDEPENDENT_AMBULATORY_CARE_PROVIDER_SITE_OTHER): Payer: Medicare Other

## 2016-10-20 ENCOUNTER — Encounter: Payer: Self-pay | Admitting: Nurse Practitioner

## 2016-10-20 VITALS — BP 121/70 | HR 80 | Temp 96.9°F | Ht 65.0 in | Wt 134.3 lb

## 2016-10-20 DIAGNOSIS — S322XXA Fracture of coccyx, initial encounter for closed fracture: Secondary | ICD-10-CM

## 2016-10-20 DIAGNOSIS — R34 Anuria and oliguria: Secondary | ICD-10-CM | POA: Diagnosis not present

## 2016-10-20 DIAGNOSIS — M533 Sacrococcygeal disorders, not elsewhere classified: Secondary | ICD-10-CM | POA: Diagnosis not present

## 2016-10-20 DIAGNOSIS — I482 Chronic atrial fibrillation, unspecified: Secondary | ICD-10-CM

## 2016-10-20 DIAGNOSIS — N3000 Acute cystitis without hematuria: Secondary | ICD-10-CM | POA: Diagnosis not present

## 2016-10-20 DIAGNOSIS — Z7901 Long term (current) use of anticoagulants: Secondary | ICD-10-CM

## 2016-10-20 DIAGNOSIS — K5904 Chronic idiopathic constipation: Secondary | ICD-10-CM | POA: Diagnosis not present

## 2016-10-20 DIAGNOSIS — S3210XA Unspecified fracture of sacrum, initial encounter for closed fracture: Secondary | ICD-10-CM | POA: Diagnosis not present

## 2016-10-20 DIAGNOSIS — R739 Hyperglycemia, unspecified: Secondary | ICD-10-CM

## 2016-10-20 LAB — URINALYSIS, COMPLETE
Bilirubin, UA: NEGATIVE
Glucose, UA: NEGATIVE
Nitrite, UA: NEGATIVE
PH UA: 6 (ref 5.0–7.5)
Protein, UA: NEGATIVE
RBC, UA: NEGATIVE
Specific Gravity, UA: 1.01 (ref 1.005–1.030)
Urobilinogen, Ur: 2 mg/dL — ABNORMAL HIGH (ref 0.2–1.0)

## 2016-10-20 LAB — COAGUCHEK XS/INR WAIVED
INR: 1.8 — ABNORMAL HIGH (ref 0.9–1.1)
PROTHROMBIN TIME: 21.8 s

## 2016-10-20 LAB — MICROSCOPIC EXAMINATION: Epithelial Cells (non renal): >10 /hpf — AB (ref 0–10)

## 2016-10-20 MED ORDER — SULFAMETHOXAZOLE-TRIMETHOPRIM 800-160 MG PO TABS: 1.0000 | ORAL_TABLET | Freq: Two times a day (BID) | ORAL | 0 refills | Status: DC

## 2016-10-20 NOTE — Addendum Note (Signed)
Addended by: Chevis Pretty on: 10/20/2016 04:38 PM   Modules accepted: Orders

## 2016-10-20 NOTE — Patient Instructions (Addendum)
Fall Prevention in the Home Falls can cause injuries. They can happen to people of all ages. There are many things you can do to make your home safe and to help prevent falls. What can I do on the outside of my home?  Regularly fix the edges of walkways and driveways and fix any cracks.  Remove anything that might make you trip as you walk through a door, such as a raised step or threshold.  Trim any bushes or trees on the path to your home.  Use bright outdoor lighting.  Clear any walking paths of anything that might make someone trip, such as rocks or tools.  Regularly check to see if handrails are loose or broken. Make sure that both sides of any steps have handrails.  Any raised decks and porches should have guardrails on the edges.  Have any leaves, snow, or ice cleared regularly.  Use sand or salt on walking paths during winter.  Clean up any spills in your garage right away. This includes oil or grease spills. What can I do in the bathroom?  Use night lights.  Install grab bars by the toilet and in the tub and shower. Do not use towel bars as grab bars.  Use non-skid mats or decals in the tub or shower.  If you need to sit down in the shower, use a plastic, non-slip stool.  Keep the floor dry. Clean up any water that spills on the floor as soon as it happens.  Remove soap buildup in the tub or shower regularly.  Attach bath mats securely with double-sided non-slip rug tape.  Do not have throw rugs and other things on the floor that can make you trip. What can I do in the bedroom?  Use night lights.  Make sure that you have a light by your bed that is easy to reach.  Do not use any sheets or blankets that are too big for your bed. They should not hang down onto the floor.  Have a firm chair that has side arms. You can use this for support while you get dressed.  Do not have throw rugs and other things on the floor that can make you trip. What can I do in the  kitchen?  Clean up any spills right away.  Avoid walking on wet floors.  Keep items that you use a lot in easy-to-reach places.  If you need to reach something above you, use a strong step stool that has a grab bar.  Keep electrical cords out of the way.  Do not use floor polish or wax that makes floors slippery. If you must use wax, use non-skid floor wax.  Do not have throw rugs and other things on the floor that can make you trip. What can I do with my stairs?  Do not leave any items on the stairs.  Make sure that there are handrails on both sides of the stairs and use them. Fix handrails that are broken or loose. Make sure that handrails are as long as the stairways.  Check any carpeting to make sure that it is firmly attached to the stairs. Fix any carpet that is loose or worn.  Avoid having throw rugs at the top or bottom of the stairs. If you do have throw rugs, attach them to the floor with carpet tape.  Make sure that you have a light switch at the top of the stairs and the bottom of the stairs. If you do   not have them, ask someone to add them for you. What else can I do to help prevent falls?  Wear shoes that:  Do not have high heels.  Have rubber bottoms.  Are comfortable and fit you well.  Are closed at the toe. Do not wear sandals.  If you use a stepladder:  Make sure that it is fully opened. Do not climb a closed stepladder.  Make sure that both sides of the stepladder are locked into place.  Ask someone to hold it for you, if possible.  Clearly mark and make sure that you can see:  Any grab bars or handrails.  First and last steps.  Where the edge of each step is.  Use tools that help you move around (mobility aids) if they are needed. These include:  Canes.  Walkers.  Scooters.  Crutches.  Turn on the lights when you go into a dark area. Replace any light bulbs as soon as they burn out.  Set up your furniture so you have a clear path.  Avoid moving your furniture around.  If any of your floors are uneven, fix them.  If there are any pets around you, be aware of where they are.  Review your medicines with your doctor. Some medicines can make you feel dizzy. This can increase your chance of falling. Ask your doctor what other things that you can do to help prevent falls. This information is not intended to replace advice given to you by your health care provider. Make sure you discuss any questions you have with your health care provider. Document Released: 05/10/2009 Document Revised: 12/20/2015 Document Reviewed: 08/18/2014 Elsevier Interactive Patient Education  2017 [place] Injury The tailbone is the small bone at the lower end of the backbone (spine). You may have stretched tissues, bruises, or a broken bone (fracture). These injuries can be painful. Most tailbone injuries get better on their own in 4-6 weeks. Follow these instructions at home:  Take medicines only as told by your doctor.  If told, apply ice to the injured area.  Put ice in a plastic bag.  Place a towel between your skin and the bag.  Leave the ice on for 20 minutes, 2-3 times per day. Do this for the first 1-2 days.  Sit on a large, rubber or inflated ring or cushion to lessen pain. Lean forward when you sit to help lessen pain.  Avoid sitting in one place for a long time.  Increase your activity as the pain allows.  Do exercises as told by your doctor or physical therapist.  If it is painful to poop, take medicine to help you poop (stool softeners) as told by your doctor.  Eat foods that have plenty of fiber.  Keep all follow-up visits as told by your doctor. This is important. Contact a doctor if:  Your pain gets worse.  Pooping causes you pain.  You cannot poop (constipation).  You are leaking pee (urinary incontinence).  You have a fever. This information is not intended to replace advice given to you by  your health care provider. Make sure you discuss any questions you have with your health care provider. Document Released: 08/16/2010 Document Revised: 03/13/2016 Document Reviewed: 07/10/2014 Elsevier Interactive Patient Education  2017 [employer].

## 2016-10-20 NOTE — Addendum Note (Signed)
Addended by: Chevis Pretty on: 10/20/2016 04:34 PM   Modules accepted: Orders

## 2016-10-20 NOTE — Telephone Encounter (Signed)
Daughter [name] called stating that patient fell Saturday night. States that she has bruises on her knee, hip, and bilateral arms. Patient urinated fine Sunday but Sunday night she did not have to change her pad. [name] states that patient normally has to change her pad 3 times per night but last night she did not have to change it at all and only urinated a little this morning. Advised her that the patient needed to be seen and offered to put them with another provider since [facility] was booked. Sonja refused and said she wanted to see what [facility] thought first.
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
## 2016-10-20 NOTE — Telephone Encounter (Signed)
With her mother's age and the fall, the patient should come in and see a provider for follow-up to make sure that everything is stable

## 2016-10-20 NOTE — Telephone Encounter (Signed)
Appointment made with MMM today at 3:15.

## 2016-10-21 ENCOUNTER — Other Ambulatory Visit: Payer: Self-pay | Admitting: Nurse Practitioner

## 2016-10-21 DIAGNOSIS — R739 Hyperglycemia, unspecified: Secondary | ICD-10-CM | POA: Diagnosis not present

## 2016-10-21 LAB — BMP8+EGFR
BUN/Creatinine Ratio: 30 — ABNORMAL HIGH (ref 12–28)
BUN: 21 mg/dL (ref 10–36)
CO2: 27 mmol/L (ref 18–29)
Calcium: 9.6 mg/dL (ref 8.7–10.3)
Chloride: 97 mmol/L (ref 96–106)
Creatinine, Ser: 0.71 mg/dL (ref 0.57–1.00)
GFR calc Af Amer: 83 mL/min/1.73
GFR calc non Af Amer: 72 mL/min/1.73
Glucose: 113 mg/dL — ABNORMAL HIGH (ref 65–99)
Potassium: 4.7 mmol/L (ref 3.5–5.2)
Sodium: 139 mmol/L (ref 134–144)

## 2016-10-21 LAB — BAYER DCA HB A1C WAIVED: HB A1C (BAYER DCA - WAIVED): 6.4 %

## 2016-10-21 NOTE — Addendum Note (Signed)
Addended by: Liliane Bade on: 10/21/2016 03:09 PM   Modules accepted: Orders

## 2016-10-29 ENCOUNTER — Ambulatory Visit (INDEPENDENT_AMBULATORY_CARE_PROVIDER_SITE_OTHER): Payer: Medicare Other | Admitting: *Deleted

## 2016-10-29 DIAGNOSIS — I442 Atrioventricular block, complete: Secondary | ICD-10-CM | POA: Diagnosis not present

## 2016-10-30 ENCOUNTER — Ambulatory Visit (INDEPENDENT_AMBULATORY_CARE_PROVIDER_SITE_OTHER): Payer: Medicare Other | Admitting: Family Medicine

## 2016-10-30 ENCOUNTER — Encounter: Payer: Self-pay | Admitting: Family Medicine

## 2016-10-30 ENCOUNTER — Encounter: Payer: Self-pay | Admitting: Cardiology

## 2016-10-30 VITALS — BP 120/68 | HR 69 | Temp 98.3°F | Ht 65.0 in | Wt 130.0 lb

## 2016-10-30 DIAGNOSIS — E559 Vitamin D deficiency, unspecified: Secondary | ICD-10-CM | POA: Diagnosis not present

## 2016-10-30 DIAGNOSIS — I10 Essential (primary) hypertension: Secondary | ICD-10-CM

## 2016-10-30 DIAGNOSIS — R7989 Other specified abnormal findings of blood chemistry: Secondary | ICD-10-CM

## 2016-10-30 DIAGNOSIS — E7800 Pure hypercholesterolemia, unspecified: Secondary | ICD-10-CM

## 2016-10-30 DIAGNOSIS — R946 Abnormal results of thyroid function studies: Secondary | ICD-10-CM | POA: Diagnosis not present

## 2016-10-30 DIAGNOSIS — S8011XD Contusion of right lower leg, subsequent encounter: Secondary | ICD-10-CM | POA: Diagnosis not present

## 2016-10-30 DIAGNOSIS — S322XXD Fracture of coccyx, subsequent encounter for fracture with routine healing: Secondary | ICD-10-CM | POA: Diagnosis not present

## 2016-10-30 DIAGNOSIS — I482 Chronic atrial fibrillation, unspecified: Secondary | ICD-10-CM

## 2016-10-30 LAB — CUP PACEART REMOTE DEVICE CHECK
Battery Voltage: 2.96 V
Brady Statistic RV Percent Paced: 99 %
Implantable Lead Location: 753860
Lead Channel Impedance Value: 450 Ohm
Lead Channel Pacing Threshold Amplitude: 0.625 V
Lead Channel Pacing Threshold Pulse Width: 0.4 ms
Lead Channel Sensing Intrinsic Amplitude: 12 mV
Lead Channel Setting Pacing Amplitude: 0.875
Lead Channel Setting Pacing Pulse Width: 0.4 ms
MDC IDC LEAD IMPLANT DT: 20020418
MDC IDC MSMT BATTERY REMAINING LONGEVITY: 148 mo
MDC IDC MSMT BATTERY REMAINING PERCENTAGE: 95.5 %
MDC IDC PG IMPLANT DT: 20131226
MDC IDC SESS DTM: 20180404203713
MDC IDC SET LEADCHNL RV SENSING SENSITIVITY: 4 mV
Pulse Gen Serial Number: 7331332

## 2016-10-30 NOTE — Progress Notes (Signed)
Subjective:    Patient ID: [name], female    DOB: 1916-09-23, 83 y.o.   MRN: [id number]  HPI Pt here for follow up and management of chronic medical problems which includes a fib, hypertension, and hyperlipidemia. She is taking medication regularly. This patient had a fall a couple weeks ago when the power was out and landed on her coccyx and sacral area and it was felt she might have a coccygeal fracture. She denies any other complaints today. She is due to get lab work and will be given an FOBT to return. Her vital signs are stable and her weight is down 4 pounds since the last visit. The patient is pleasant and alert and comes to the visit today with her daughter. She denies any specific complaints other than soreness on her bottom but no complaints of pain with this hematoma on her right leg. She has not had any chest pain or shortness of breath anymore than usual. She did have some initial constipation but her stools are now more normal. The daughter withheld her iron and calcium following the fall and will restart this soon. The patient does drink 3 glasses of water a day is encouraged to drink more. She is passing her water without problems.   Patient Active Problem List   Diagnosis Date Noted  . Abdominal aortic atherosclerosis ([place]) 05/29/2016  . Ventral hernia 12/08/2013  . Vitamin D deficiency 12/08/2013  . Breast cancer ([place]) 12/08/2013  . Long term current use of anticoagulant therapy 05/05/2013  . Shortness of breath 05/02/2013  . Restless leg syndrome 10/12/2010  . CAROTID BRUIT 08/21/2010  . MITRAL REGURGITATION 08/01/2009  . HYPERTENSION, BENIGN 08/17/2008  . AV BLOCK, COMPLETE 08/17/2008  . ATRIAL FIBRILLATION 08/17/2008  . PACEMAKER, PERMANENT 08/17/2008   Outpatient Encounter Prescriptions as of 10/30/2016  Medication Sig  . b complex vitamins tablet Take 1 tablet by mouth daily.   . Cholecalciferol (VITAMIN D3) 1000 UNITS tablet Take 2,000 Units by mouth  daily.    . Fe Fum-FePoly-Vit C-Vit B3 (INTEGRA) 62.5-62.5-40-3 MG CAPS TAKE ONE CAPSULE BY MOUTH ONCE DAILY.  . fish oil-omega-3 fatty acids 1000 MG capsule Take 2 g by mouth daily.  . furosemide (LASIX) 20 MG tablet Take 1 tablet (20 mg total) by mouth daily as needed.  . polyethylene glycol powder (GLYCOLAX/MIRALAX) powder Take 17 g by mouth 2 (two) times daily.  . vitamin C (ASCORBIC ACID) 500 MG tablet Take 500 mg by mouth daily.    . vitamin E 400 UNIT capsule Take 400 Units by mouth daily.  Marland Kitchen warfarin (COUMADIN) 5 MG tablet TAKE 1/2 OR 1 TABLET BY MOUTH ONCE A DAY OR AS INSTRUCTED BY COUMADIN ANTICOAGULATION CLINIC  . [DISCONTINUED] sulfamethoxazole-trimethoprim (BACTRIM DS) 800-160 MG tablet Take 1 tablet by mouth 2 (two) times daily.   No facility-administered encounter medications on file as of 10/30/2016.        Review of Systems  Constitutional: Negative.   HENT: Negative.   Eyes: Negative.   Respiratory: Negative.   Cardiovascular: Negative.   Gastrointestinal: Negative.   Endocrine: Negative.   Genitourinary: Negative.   Musculoskeletal: Positive for back pain (fractured tail bone).  Skin: Positive for wound (right lower leg - fall).  Allergic/Immunologic: Negative.   Neurological: Negative.   Hematological: Negative.   Psychiatric/Behavioral: Negative.        Objective:   Physical Exam  Constitutional: She is oriented to person, place, and time. She appears well-developed. No distress.  The patient is pleasant and alert and looks much younger than her stated age of 48 years. She is in a wheelchair and she comes to the visit with her daughter.  HENT:  Head: Normocephalic and atraumatic.  Right Ear: External ear normal.  Left Ear: External ear normal.  Nose: Nose normal.  Mouth/Throat: Oropharynx is clear and moist. No oropharyngeal exudate.  Eyes: Conjunctivae and EOM are normal. Pupils are equal, round, and reactive to light. Right eye exhibits no discharge.  Left eye exhibits no discharge. No scleral icterus.  Neck: Normal range of motion. Neck supple. No thyromegaly present.  Cardiovascular: Normal rate, regular rhythm and normal heart sounds.   No murmur heard. The heart has a regular rate and rhythm today at 72/m  Pulmonary/Chest: Effort normal and breath sounds normal. No respiratory distress. She has no wheezes. She has no rales. She exhibits no tenderness.  Clear anteriorly and posteriorly  Abdominal: Soft. Bowel sounds are normal. She exhibits no mass. There is no tenderness. There is no rebound and no guarding.  No abdominal tenderness masses or organ enlargement  Musculoskeletal: She exhibits tenderness. She exhibits no edema.  There is some soreness and tenderness over the coccygeal area where her fracture is located. The patient is in a wheelchair today.  Lymphadenopathy:    She has no cervical adenopathy.  Neurological: She is alert and oriented to person, place, and time.  Skin: Skin is warm and dry. No rash noted.  There is a large silver dollar sized hematoma on the right anterior leg. There is surrounding redness and warmth. There is bruising up and down the leg from the hematoma. We will continue to have the patient use warm wet compresses and keep a dressing over it to keep from irritating it anymore.  Psychiatric: She has a normal mood and affect. Her behavior is normal. Judgment and thought content normal.  Nursing note and vitals reviewed.   BP 120/68 (BP Location: Right Arm)   Pulse 69   Temp 98.3 F (36.8 C) (Oral)   Ht 5\' 5"  (1.651 m)   Wt 130 lb (59 kg)   BMI 21.63 kg/m        Assessment & Plan:  1. HYPERTENSION, BENIGN -The blood pressure is good today and she will continue with her current medication - CBC with Differential/Platelet - Hepatic function panel  2. Pure hypercholesterolemia -Continue with omega-3 fatty acids - CBC with Differential/Platelet - Hepatic function panel - Lipid panel  3.  Vitamin D deficiency -Continue with current treatment pending results of lab work - CBC with Differential/Platelet - VITAMIN D 25 Hydroxy (Vit-D Deficiency, Fractures)  4. Chronic atrial fibrillation (HCC) -The heart appeared to have a regular rate and rhythm today at 72/m. She will continue to follow-up with cardiology as planned and will stay on her blood thinner. - CBC with Differential/Platelet  5. Elevated TSH - CBC with Differential/Platelet - Thyroid Panel With TSH  6. Open fracture of coccyx with routine healing, subsequent encounter -This is actually a closed fracture. And not an open one. Consult continue doughnut cushion and soft support for sitting.  7. Traumatic hematoma of right lower leg, subsequent encounter -Continue with warm wet compresses and monitoring. -When she comes in for her next pro time visit we will ask the clinical pharmacist to let us look at the hematoma at that time. -Please get CBC at next visit with clinical pharmacy.  Patient Instructions  Medicare Annual Wellness Visit  [facility] and the medical providers at [facility] strive to bring you the best medical care.  In doing so we not only want to address your current medical conditions and concerns but also to detect new conditions early and prevent illness, disease and health-related problems.    Medicare offers a yearly Wellness Visit which allows our clinical staff to assess your need for preventative services including immunizations, lifestyle education, counseling to decrease risk of preventable diseases and screening for fall risk and other medical concerns.    This visit is provided free of charge (no copay) for all Medicare recipients. The clinical pharmacists at [facility] have begun to conduct these Wellness Visits which will also include a thorough review of all your medications.    As you primary medical provider  recommend that you make an appointment for your Annual Wellness Visit if you have not done so already this year.  You may set up this appointment before you leave today or you may call back ([phone]) and schedule an appointment.  Please make sure when you call that you mention that you are scheduling your Annual Wellness Visit with the clinical pharmacist so that the appointment may be made for the proper length of time.     Continue current medications. Continue good therapeutic lifestyle changes which include good diet and exercise. Fall precautions discussed with patient. If an FOBT was given today- please return it to our front desk. If you are over 31 years old - you may need Prevnar 89 or the adult Pneumonia vaccine.  **Flu shots are available--- please call and schedule a FLU-CLINIC appointment**  After your visit with [place] today you will receive a survey in the mail or online from [employer] regarding your care with [place]. Please take a moment to fill this out. Your feedback is very important to [place] as you can help [place] better understand your patient needs as well as improve your experience and satisfaction. WE CARE ABOUT YOU!!!  Use warm wet compresses to hematoma of right lower leg Continue to sit on a doughnut cushion to help control coccygeal pain Have provider look at leg at next check a ProTime. Consider trying some in sure her Glucerna for extra nourishment because of recent weight loss   Arrie Senate MD

## 2016-10-30 NOTE — Patient Instructions (Addendum)
Medicare Annual Wellness Visit  [facility] and the medical providers at [facility] strive to bring you the best medical care.  In doing so we not only want to address your current medical conditions and concerns but also to detect new conditions early and prevent illness, disease and health-related problems.    Medicare offers a yearly Wellness Visit which allows our clinical staff to assess your need for preventative services including immunizations, lifestyle education, counseling to decrease risk of preventable diseases and screening for fall risk and other medical concerns.    This visit is provided free of charge (no copay) for all Medicare recipients. The clinical pharmacists at [facility] have begun to conduct these Wellness Visits which will also include a thorough review of all your medications.    As you primary medical provider recommend that you make an appointment for your Annual Wellness Visit if you have not done so already this year.  You may set up this appointment before you leave today or you may call back ([phone]) and schedule an appointment.  Please make sure when you call that you mention that you are scheduling your Annual Wellness Visit with the clinical pharmacist so that the appointment may be made for the proper length of time.     Continue current medications. Continue good therapeutic lifestyle changes which include good diet and exercise. Fall precautions discussed with patient. If an FOBT was given today- please return it to our front desk. If you are over 24 years old - you may need Prevnar 42 or the adult Pneumonia vaccine.  **Flu shots are available--- please call and schedule a FLU-CLINIC appointment**  After your visit with [place] today you will receive a survey in the mail or online from [employer] regarding your care with [place]. Please take a moment to fill this out. Your feedback is very  important to [place] as you can help [place] better understand your patient needs as well as improve your experience and satisfaction. WE CARE ABOUT YOU!!!  Use warm wet compresses to hematoma of right lower leg Continue to sit on a doughnut cushion to help control coccygeal pain Have provider look at leg at next check a ProTime. Consider trying some in sure her Glucerna for extra nourishment because of recent weight loss

## 2016-10-30 NOTE — Progress Notes (Signed)
Remote pacemaker transmission.   

## 2016-10-31 LAB — CBC WITH DIFFERENTIAL/PLATELET
BASOS: 1 %
Basophils Absolute: 0.1 10*3/uL (ref 0.0–0.2)
EOS (ABSOLUTE): 0.2 10*3/uL (ref 0.0–0.4)
EOS: 4 %
HEMATOCRIT: 30.7 % — AB (ref 34.0–46.6)
Hemoglobin: 10.5 g/dL — ABNORMAL LOW (ref 11.1–15.9)
IMMATURE GRANS (ABS): 0 10*3/uL (ref 0.0–0.1)
IMMATURE GRANULOCYTES: 0 %
LYMPHS: 26 %
Lymphocytes Absolute: 1.6 10*3/uL (ref 0.7–3.1)
MCH: 29.1 pg (ref 26.6–33.0)
MCHC: 34.2 g/dL (ref 31.5–35.7)
MCV: 85 fL (ref 79–97)
Monocytes Absolute: 0.9 10*3/uL (ref 0.1–0.9)
Monocytes: 16 %
NEUTROS ABS: 3.1 10*3/uL (ref 1.4–7.0)
NEUTROS PCT: 53 %
Platelets: 288 10*3/uL (ref 150–379)
RBC: 3.61 x10E6/uL — ABNORMAL LOW (ref 3.77–5.28)
RDW: 13.8 % (ref 12.3–15.4)
WBC: 5.9 10*3/uL (ref 3.4–10.8)

## 2016-10-31 LAB — THYROID PANEL WITH TSH
Free Thyroxine Index: 1.5 (ref 1.2–4.9)
T3 Uptake Ratio: 30 % (ref 24–39)
T4 TOTAL: 5 ug/dL (ref 4.5–12.0)
TSH: 4.32 u[IU]/mL (ref 0.450–4.500)

## 2016-10-31 LAB — HEPATIC FUNCTION PANEL
ALK PHOS: 91 IU/L (ref 39–117)
ALT: 15 IU/L (ref 0–32)
AST: 24 IU/L (ref 0–40)
Albumin: 4.2 g/dL (ref 3.2–4.6)
BILIRUBIN TOTAL: 1 mg/dL (ref 0.0–1.2)
BILIRUBIN, DIRECT: 0.29 mg/dL (ref 0.00–0.40)
Total Protein: 7 g/dL (ref 6.0–8.5)

## 2016-10-31 LAB — LIPID PANEL
CHOLESTEROL TOTAL: 203 mg/dL — AB (ref 100–199)
Chol/HDL Ratio: 3.8 ratio (ref 0.0–4.4)
HDL: 53 mg/dL (ref >39)
LDL CALC: 134 mg/dL — AB (ref 0–99)
TRIGLYCERIDES: 78 mg/dL (ref 0–149)
VLDL CHOLESTEROL CAL: 16 mg/dL (ref 5–40)

## 2016-10-31 LAB — VITAMIN D 25 HYDROXY (VIT D DEFICIENCY, FRACTURES): VIT D 25 HYDROXY: 40.9 ng/mL (ref 30.0–100.0)

## 2016-12-15 ENCOUNTER — Telehealth: Payer: Self-pay | Admitting: Family Medicine

## 2016-12-15 ENCOUNTER — Other Ambulatory Visit: Payer: Medicare Other

## 2016-12-15 DIAGNOSIS — R300 Dysuria: Secondary | ICD-10-CM

## 2016-12-15 LAB — MICROSCOPIC EXAMINATION
RBC MICROSCOPIC, UA: NONE SEEN /HPF (ref 0–?)
Renal Epithel, UA: NONE SEEN /hpf

## 2016-12-15 LAB — URINALYSIS, COMPLETE
Bilirubin, UA: NEGATIVE
Glucose, UA: NEGATIVE
KETONES UA: NEGATIVE
Leukocytes, UA: NEGATIVE
NITRITE UA: NEGATIVE
Protein, UA: NEGATIVE
RBC UA: NEGATIVE
SPEC GRAV UA: 1.015 (ref 1.005–1.030)
Urobilinogen, Ur: 0.2 mg/dL (ref 0.2–1.0)
pH, UA: 7 (ref 5.0–7.5)

## 2016-12-15 NOTE — Telephone Encounter (Signed)
Should I have gotten this?

## 2016-12-15 NOTE — Telephone Encounter (Signed)
What symptoms do you have? A little burning with urination, pt thinks she has UTI, but daughter thinks she is paranoid Daughter wants to know if she can bring in urine  How long have you been sick? About three days  Have you been seen for this problem? no  If your provider decides to give you a prescription, which pharmacy would you like for it to be sent to? [facility]   Patient informed that this information will be sent to the clinical staff for review and that they should receive a follow up call.

## 2016-12-15 NOTE — Telephone Encounter (Signed)
Pt daughter aware - she will bring in Urine- order placed

## 2016-12-18 ENCOUNTER — Ambulatory Visit (INDEPENDENT_AMBULATORY_CARE_PROVIDER_SITE_OTHER): Payer: Medicare Other | Admitting: Pharmacist

## 2016-12-18 DIAGNOSIS — I482 Chronic atrial fibrillation, unspecified: Secondary | ICD-10-CM

## 2016-12-18 DIAGNOSIS — Z7901 Long term (current) use of anticoagulants: Secondary | ICD-10-CM

## 2016-12-18 LAB — COAGUCHEK XS/INR WAIVED
INR: 1.5 — ABNORMAL HIGH (ref 0.9–1.1)
PROTHROMBIN TIME: 17.9 s

## 2016-12-18 MED ORDER — BOOST PO LIQD: 237.0000 mL | Freq: Every day | ORAL | 0 refills | Status: AC

## 2016-12-19 LAB — URINE CULTURE

## 2016-12-23 ENCOUNTER — Other Ambulatory Visit: Payer: Self-pay | Admitting: *Deleted

## 2016-12-23 MED ORDER — CIPROFLOXACIN HCL 250 MG PO TABS: 250.0000 mg | ORAL_TABLET | Freq: Two times a day (BID) | ORAL | 0 refills | Status: DC

## 2017-01-08 ENCOUNTER — Ambulatory Visit (INDEPENDENT_AMBULATORY_CARE_PROVIDER_SITE_OTHER): Payer: Medicare Other | Admitting: Pharmacist

## 2017-01-08 DIAGNOSIS — I482 Chronic atrial fibrillation, unspecified: Secondary | ICD-10-CM

## 2017-01-08 DIAGNOSIS — Z7901 Long term (current) use of anticoagulants: Secondary | ICD-10-CM

## 2017-01-08 LAB — COAGUCHEK XS/INR WAIVED
INR: 2.3 — ABNORMAL HIGH (ref 0.9–1.1)
Prothrombin Time: 27.8 s

## 2017-01-23 ENCOUNTER — Other Ambulatory Visit: Payer: Self-pay | Admitting: Family Medicine

## 2017-01-29 ENCOUNTER — Telehealth: Payer: Self-pay | Admitting: Cardiology

## 2017-01-29 ENCOUNTER — Ambulatory Visit (INDEPENDENT_AMBULATORY_CARE_PROVIDER_SITE_OTHER): Payer: Medicare Other | Admitting: *Deleted

## 2017-01-29 DIAGNOSIS — I442 Atrioventricular block, complete: Secondary | ICD-10-CM | POA: Diagnosis not present

## 2017-01-29 NOTE — Telephone Encounter (Signed)
Spoke with pt and reminded pt of remote transmission that is due today. Pt verbalized understanding.   

## 2017-01-30 NOTE — Progress Notes (Signed)
Remote pacemaker transmission.   

## 2017-02-06 ENCOUNTER — Encounter: Payer: Self-pay | Admitting: Cardiology

## 2017-02-06 ENCOUNTER — Ambulatory Visit (INDEPENDENT_AMBULATORY_CARE_PROVIDER_SITE_OTHER): Payer: Medicare Other | Admitting: Pharmacist Clinician (PhC)/ Clinical Pharmacy Specialist

## 2017-02-06 DIAGNOSIS — I482 Chronic atrial fibrillation, unspecified: Secondary | ICD-10-CM

## 2017-02-06 DIAGNOSIS — I4891 Unspecified atrial fibrillation: Secondary | ICD-10-CM

## 2017-02-06 DIAGNOSIS — Z7901 Long term (current) use of anticoagulants: Secondary | ICD-10-CM | POA: Diagnosis not present

## 2017-02-06 LAB — COAGUCHEK XS/INR WAIVED
INR: 2.5 — ABNORMAL HIGH (ref 0.9–1.1)
Prothrombin Time: 30 s

## 2017-02-06 NOTE — Patient Instructions (Signed)
Anticoagulation Warfarin Dose Instructions as of 02/06/2017      [name] Tue Wed Thu Fri Sat   New Dose 5 mg 2.5 mg 5 mg 5 mg 5 mg 5 mg 5 mg    Description   Continue current warfarin dose -  take 1/2 tablet on Mondays only.  Take 1 tablet all other days.  INR today 2.5

## 2017-02-18 ENCOUNTER — Encounter (INDEPENDENT_AMBULATORY_CARE_PROVIDER_SITE_OTHER): Payer: Self-pay

## 2017-02-18 ENCOUNTER — Ambulatory Visit (INDEPENDENT_AMBULATORY_CARE_PROVIDER_SITE_OTHER): Payer: Medicare Other | Admitting: Internal Medicine

## 2017-02-18 VITALS — BP 108/84 | HR 70 | Ht 65.0 in

## 2017-02-18 DIAGNOSIS — Z950 Presence of cardiac pacemaker: Secondary | ICD-10-CM

## 2017-02-18 DIAGNOSIS — I442 Atrioventricular block, complete: Secondary | ICD-10-CM | POA: Diagnosis not present

## 2017-02-18 DIAGNOSIS — I4821 Permanent atrial fibrillation: Secondary | ICD-10-CM

## 2017-02-18 DIAGNOSIS — I482 Chronic atrial fibrillation: Secondary | ICD-10-CM | POA: Diagnosis not present

## 2017-02-18 NOTE — Patient Instructions (Addendum)
Medication Instructions: - Your physician recommends that you continue on your current medications as directed. Please refer to the Current Medication list given to you today.  Labwork: - none ordered  Procedures/Testing: - none ordered  Follow-Up: - Remote monitoring is used to monitor your Pacemaker of ICD from home. This monitoring reduces the number of office visits required to check your device to one time per year. It allows [place] to keep an eye on the functioning of your device to ensure it is working properly. You are scheduled for a device check from home on 04/30/17. You may send your transmission at any time that day. If you have a wireless device, the transmission will be sent automatically. After your physician reviews your transmission, you will receive a postcard with your next transmission date.  - Your physician wants you to follow-up in: 1 year with Dr. Caryl Comes. You will receive a reminder letter in the mail two months in advance. If you don't receive a letter, please call our office to schedule the follow-up appointment.   Any Additional Special Instructions Will Be Listed Below (If Applicable).     If you need a refill on your cardiac medications before your next appointment, please call your pharmacy.

## 2017-02-18 NOTE — Progress Notes (Signed)
Patient Care Team: Chipper Herb, MD as PCP - General (Family Medicine) Minus Breeding, MD as Consulting Physician (Cardiology) Deboraha Sprang, MD as Consulting Physician (Cardiology) Garald Balding, MD as Consulting Physician (Orthopedic Surgery) Williams Che, MD (Inactive) as Consulting Physician (Ophthalmology)   HPI  [name] is a 83 y.o. female Seen in followup for complete heart block. She status post pacemaker and underwent generator replacement 12/13 at [facility].  She has a history of atrial fibrillation-permanent and status post AV junction ablation.  She has known moderate aortic stenosis;. she has severe biatrial enlargement and moderate to severe tricuspid regurgitation by echo 2/14.  She no significant sob, no edema no chest pain     Past Medical History:  Diagnosis Date  . Atrial fibrillation ([place])   . Carotid bruit   . Cataract   . Colon disorder    Adhesions  . Heart failure, diastolic ([place])   . Hyperlipidemia    statin intolerant  . Hypertension   . Mitral regurgitation    Moderate  . Nephrolithiasis   . Ovarian cyst   . QT prolongation    With solatol  . Restless leg syndrome   . S/P AV nodal ablation     Past Surgical History:  Procedure Laterality Date  . ABDOMINAL ADHESION SURGERY     twice  . BREAST SURGERY Left 2010   lumpectomy  . CATARACT EXTRACTION     Bilaterally  . COLON SURGERY     Colonic obstruction secondary to diverticulitis with resection  . CYSTECTOMY     Ovarian  . EYE SURGERY    . JOINT REPLACEMENT Right 2004   hip  . PACEMAKER INSERTION     St. Jude  . PERMANENT PACEMAKER GENERATOR CHANGE N/A 07/22/2012   Procedure: PERMANENT PACEMAKER GENERATOR CHANGE;  Surgeon: Deboraha Sprang, MD;  Location: [facility] CATH LAB;  Service: Cardiovascular;  Laterality: N/A;    Current Outpatient Prescriptions  Medication Sig Dispense Refill  . b complex vitamins tablet Take 1 tablet by mouth daily.     . Cholecalciferol  (VITAMIN D3) 1000 UNITS tablet Take 2,000 Units by mouth daily.      . ciprofloxacin (CIPRO) 250 MG tablet Take 1 tablet (250 mg total) by mouth 2 (two) times daily. 6 tablet 0  . Fe Fum-FePoly-Vit C-Vit B3 (INTEGRA) 62.5-62.5-40-3 MG CAPS TAKE ONE CAPSULE BY MOUTH ONCE DAILY. 90 capsule 0  . fish oil-omega-3 fatty acids 1000 MG capsule Take 2 g by mouth daily.    . furosemide (LASIX) 20 MG tablet Take 1 tablet (20 mg total) by mouth daily as needed. 90 tablet 3  . lactose free nutrition (BOOST) LIQD Take 237 mLs by mouth daily.  0  . polyethylene glycol powder (GLYCOLAX/MIRALAX) powder Take 17 g by mouth 2 (two) times daily. 3468 g 3  . vitamin C (ASCORBIC ACID) 500 MG tablet Take 500 mg by mouth daily.      . vitamin E 400 UNIT capsule Take 400 Units by mouth daily.    Marland Kitchen warfarin (COUMADIN) 5 MG tablet TAKE 1/2 OR 1 TABLET BY MOUTH ONCE A DAY OR AS INSTRUCTED BY COUMADIN ANTICOAGULATION CLINIC 90 tablet 3   No current facility-administered medications for this visit.     Allergies  Allergen Reactions  . Atorvastatin     unknown  . Boniva [Ibandronate Sodium]     unknown  . Chocolate     unknown  . Lovastatin  unknown  . Pravastatin Sodium     unknown  . Risedronate Sodium     unknown  . Toprol Xl [Metoprolol Succinate]     unknown  . Visken [Pindolol]     unknown  . Welchol [Colesevelam Hcl]     unknown  . Vesicare [Solifenacin] Anxiety and Other (See Comments)    lightheaded    Review of Systems negative except from HPI and PMH  Physical Exam BP 108/84   Pulse 70   Ht 5\' 5"  (1.651 m)   SpO2 97%  Well developed and nourished in no acute distress HENT normal Neck supple with JVP-flat Carotids delayed  Clear Regular rate and rhythm, 3/6 m Abd-soft with active BS without hepatomegaly No Clubbing cyanosis edema Skin-warm and dry A & Oriented  Grossly normal sensory and motor function   ECG demonstrates Afib with underlying Vpacing  Assessment and   Plan  Atrial fibrillation-permanent   Pacemaker-St. Jude   Complete heart block   Aortic Stenosis  Grief   She remains on anticoagulation with warfarin. Pacemaker function is normal.   Her daughter died following a series of brain bleeds   AS seems asymptomatic  Will not echo at this point   Spent time with greiving about her daughter who died from a series of intracerebral bleeds  We spent more than 50% of our >25 min visit in face to face counseling regarding the above

## 2017-02-24 LAB — CUP PACEART REMOTE DEVICE CHECK
Battery Remaining Percentage: 95.5 %
Battery Voltage: 2.96 V
Brady Statistic RV Percent Paced: 99 %
Implantable Lead Location: 753860
Lead Channel Impedance Value: 440 Ohm
Lead Channel Sensing Intrinsic Amplitude: 12 mV
Lead Channel Setting Pacing Amplitude: 0.75 V
Lead Channel Setting Pacing Pulse Width: 0.4 ms
MDC IDC LEAD IMPLANT DT: 20020418
MDC IDC MSMT BATTERY REMAINING LONGEVITY: 147 mo
MDC IDC MSMT LEADCHNL RV PACING THRESHOLD AMPLITUDE: 0.5 V
MDC IDC MSMT LEADCHNL RV PACING THRESHOLD PULSEWIDTH: 0.4 ms
MDC IDC PG IMPLANT DT: 20131226
MDC IDC SESS DTM: 20180705174528
MDC IDC SET LEADCHNL RV SENSING SENSITIVITY: 4 mV
Pulse Gen Model: 1110
Pulse Gen Serial Number: 7331332

## 2017-02-24 LAB — CUP PACEART INCLINIC DEVICE CHECK
Date Time Interrogation Session: 20180731192943
Implantable Lead Implant Date: 20020418
Implantable Lead Model: 5076
Implantable Pulse Generator Implant Date: 20131226
Lead Channel Setting Pacing Pulse Width: 0.4 ms
Lead Channel Setting Sensing Sensitivity: 4 mV
MDC IDC LEAD LOCATION: 753860
MDC IDC PG SERIAL: 7331332
MDC IDC SET LEADCHNL RV PACING AMPLITUDE: 0.875
Pulse Gen Model: 1110

## 2017-03-02 ENCOUNTER — Other Ambulatory Visit: Payer: Self-pay | Admitting: Family Medicine

## 2017-03-09 ENCOUNTER — Encounter: Payer: Self-pay | Admitting: *Deleted

## 2017-03-17 ENCOUNTER — Ambulatory Visit (INDEPENDENT_AMBULATORY_CARE_PROVIDER_SITE_OTHER): Payer: Medicare Other | Admitting: *Deleted

## 2017-03-17 DIAGNOSIS — Z7901 Long term (current) use of anticoagulants: Secondary | ICD-10-CM

## 2017-03-17 DIAGNOSIS — I4821 Permanent atrial fibrillation: Secondary | ICD-10-CM | POA: Insufficient documentation

## 2017-03-17 DIAGNOSIS — I482 Chronic atrial fibrillation: Secondary | ICD-10-CM

## 2017-03-17 LAB — COAGUCHEK XS/INR WAIVED
INR: 1.5 — AB (ref 0.9–1.1)
Prothrombin Time: 18 s

## 2017-03-17 NOTE — Progress Notes (Signed)
Subjective:     Indication: atrial fibrillation Bleeding signs/symptoms: None Thromboembolic signs/symptoms: None  Missed Coumadin doses: This week - Friday 03/13/17 Medication changes: no Dietary changes: no Bacterial/viral infection: no Other concerns: no   Objective:   INR Today: 1.5 5 mg daily except 2.5 mg on Mondays      Assessment:    Subtherapeutic INR for goal of 2-3   Plan:   1. New dose: no change   2. Next INR: 1 week   3.Do not skip doses without discussing with provider  Chong Sicilian, RN

## 2017-03-17 NOTE — Patient Instructions (Addendum)
Anticoagulation Warfarin Dose Instructions as of 03/17/2017      [name] Tue Wed Thu Fri Sat   New Dose 5 mg 2.5 mg 5 mg 5 mg 5 mg 5 mg 5 mg    Description   Resume normal schedule of 1 tablet daily except 1/2 tablet on Mondays.   Return on 03/23/17 at 11:45 for recheck    Do not skip doses. If you are feeling bad/weak please call our office to talk about your symptoms.

## 2017-03-23 ENCOUNTER — Ambulatory Visit (INDEPENDENT_AMBULATORY_CARE_PROVIDER_SITE_OTHER): Payer: Medicare Other | Admitting: *Deleted

## 2017-03-23 DIAGNOSIS — I4821 Permanent atrial fibrillation: Secondary | ICD-10-CM

## 2017-03-23 DIAGNOSIS — I482 Chronic atrial fibrillation: Secondary | ICD-10-CM

## 2017-03-23 DIAGNOSIS — Z7901 Long term (current) use of anticoagulants: Secondary | ICD-10-CM

## 2017-03-23 LAB — COAGUCHEK XS/INR WAIVED
INR: 2.6 — ABNORMAL HIGH (ref 0.9–1.1)
Prothrombin Time: 31.1 s

## 2017-03-23 NOTE — Progress Notes (Signed)
Subjective:     Indication: atrial fibrillation Bleeding signs/symptoms: None Thromboembolic signs/symptoms: None  Missed Coumadin doses: None Medication changes: no Dietary changes: no Bacterial/viral infection: no Other concerns: no    Objective:    INR Today: 2.6 Current dose:  Anticoagulation Warfarin Dose Instructions as of 03/23/2017      Dorene Grebe Tue Wed Thu Fri Sat   New Dose 5 mg 2.5 mg 5 mg 5 mg 5 mg 5 mg 5 mg    Description   Continue normal schedule of 1 tablet daily except 1/2 tablet on Mondays.    Return on 04/23/17 at 8:15.         Assessment:    Therapeutic INR for goal of 2-3   Plan:    1. New dose: no change   2. Next INR: 1 month    Chong Sicilian, RN  Agree Mary-Margaret Hassell Done, FNP

## 2017-03-23 NOTE — Patient Instructions (Signed)
Anticoagulation Warfarin Dose Instructions as of 03/23/2017      Dorene Grebe Tue Wed Thu Fri Sat   New Dose 5 mg 2.5 mg 5 mg 5 mg 5 mg 5 mg 5 mg    Description   Continue normal schedule of 1 tablet daily except 1/2 tablet on Mondays.    Return on 04/23/17 at 8:15.

## 2017-03-26 NOTE — Addendum Note (Signed)
Addended by: Ilean China on: 03/26/2017 02:25 PM   Modules accepted: Level of Service

## 2017-03-27 ENCOUNTER — Telehealth: Payer: Self-pay | Admitting: *Deleted

## 2017-03-27 DIAGNOSIS — L602 Onychogryphosis: Secondary | ICD-10-CM

## 2017-03-27 NOTE — Telephone Encounter (Signed)
Discussed at protime visit that patient needs toenail care. She has physical limitations due to age and medical conditions and there is also a concern for bleeding due to coumadin use.   Referral to Dr Irving Shows placed.

## 2017-04-23 ENCOUNTER — Ambulatory Visit (INDEPENDENT_AMBULATORY_CARE_PROVIDER_SITE_OTHER): Payer: Medicare Other | Admitting: *Deleted

## 2017-04-23 DIAGNOSIS — Z7901 Long term (current) use of anticoagulants: Secondary | ICD-10-CM

## 2017-04-23 DIAGNOSIS — I482 Chronic atrial fibrillation: Secondary | ICD-10-CM | POA: Diagnosis not present

## 2017-04-23 DIAGNOSIS — I4821 Permanent atrial fibrillation: Secondary | ICD-10-CM

## 2017-04-23 LAB — COAGUCHEK XS/INR WAIVED
INR: 2 — AB (ref 0.9–1.1)
Prothrombin Time: 23.9 s

## 2017-04-23 NOTE — Patient Instructions (Addendum)
Anticoagulation Warfarin Dose Instructions as of 04/23/2017      [name] Tue Wed Thu Fri Sat   New Dose 5 mg 2.5 mg 5 mg 5 mg 5 mg 5 mg 5 mg    Description   Continue normal schedule of 1 tablet daily except 1/2 tablet on Mondays.    Return on 04/29/17 for appointment with Dr. Laurance Flatten.    INR was 2.0 today

## 2017-04-23 NOTE — Progress Notes (Signed)
Subjective:     Indication: atrial fibrillation. Mitral valve regurgitation Bleeding signs/symptoms: Yes - Patient states 2 weeks ago she woke up with blood on her pillow and she thinks it came from her mouth, this happened on one occassion Thromboembolic signs/symptoms: None  Missed Coumadin doses: has missed 2 doses since her last protime check- she missed a Monday dose of 2.5 mg and a 5 mg dose.  She states she decided not to take these days because she was feeling weak  Medication changes: no Dietary changes: no Bacterial/viral infection: no Other concerns: yes - Patient states she has been feeling more weak over the past month.  Scheduled her with Dr. Laurance Flatten on 04/29/17 for a follow up and lab work.   The following portions of the patient's history were reviewed and updated as appropriate: allergies, current medications, past family history, past medical history, past social history, past surgical history and problem list.     Objective:    INR Today: 2.0 Current dose: Coumadin 1 tablet (5 mg) all days except Mondays take 1/2 tablet (2.5 mg).   Assessment:    Therapeutic INR for goal of 2-3   Plan:    1. New dose: no change   2. Next INR: 1 week - scheduled patient for an appointment with Dr. Laurance Flatten on 04/29/17 for follow up and labs related to patient's increased weakness.  Advised her to seek medical attention immediately if she had any chest pain, shortness of breath, or a major bleeding event.  Patient and her great grand daughter expressed understanding.     Mary-Margaret Hassell Done, FNP   Discussed recommendations with Chevis Pretty, FNP

## 2017-04-24 ENCOUNTER — Other Ambulatory Visit: Payer: Self-pay | Admitting: Family Medicine

## 2017-04-24 ENCOUNTER — Other Ambulatory Visit: Payer: Medicare Other

## 2017-04-24 ENCOUNTER — Telehealth: Payer: Self-pay | Admitting: Family Medicine

## 2017-04-24 ENCOUNTER — Other Ambulatory Visit: Payer: Self-pay | Admitting: *Deleted

## 2017-04-24 DIAGNOSIS — R399 Unspecified symptoms and signs involving the genitourinary system: Secondary | ICD-10-CM

## 2017-04-24 LAB — URINALYSIS, COMPLETE
Bilirubin, UA: NEGATIVE
Glucose, UA: NEGATIVE
Ketones, UA: NEGATIVE
Leukocytes, UA: NEGATIVE
Nitrite, UA: NEGATIVE
PH UA: 8 — AB (ref 5.0–7.5)
PROTEIN UA: NEGATIVE
Specific Gravity, UA: 1.015 (ref 1.005–1.030)
UUROB: 0.2 mg/dL (ref 0.2–1.0)

## 2017-04-24 LAB — MICROSCOPIC EXAMINATION
RBC MICROSCOPIC, UA: NONE SEEN /HPF (ref 0–?)
RENAL EPITHEL UA: NONE SEEN /HPF

## 2017-04-24 MED ORDER — NITROFURANTOIN MONOHYD MACRO 100 MG PO CAPS: 100.0000 mg | ORAL_CAPSULE | Freq: Two times a day (BID) | ORAL | 0 refills | Status: DC

## 2017-04-24 NOTE — Telephone Encounter (Signed)
Sure. Please order

## 2017-04-24 NOTE — Telephone Encounter (Signed)
[place]

## 2017-04-24 NOTE — Telephone Encounter (Signed)
Is it ok for patients granddaughter to bring in urine specimen for patinet

## 2017-04-26 LAB — URINE CULTURE: Organism ID, Bacteria: NO GROWTH

## 2017-04-29 ENCOUNTER — Ambulatory Visit (INDEPENDENT_AMBULATORY_CARE_PROVIDER_SITE_OTHER): Payer: Medicare Other | Admitting: Family Medicine

## 2017-04-29 ENCOUNTER — Telehealth: Payer: Self-pay | Admitting: Family Medicine

## 2017-04-29 ENCOUNTER — Encounter: Payer: Self-pay | Admitting: Family Medicine

## 2017-04-29 VITALS — BP 140/83 | HR 76 | Temp 97.2°F | Ht 65.0 in | Wt 130.0 lb

## 2017-04-29 DIAGNOSIS — Z7901 Long term (current) use of anticoagulants: Secondary | ICD-10-CM | POA: Diagnosis not present

## 2017-04-29 DIAGNOSIS — I4821 Permanent atrial fibrillation: Secondary | ICD-10-CM

## 2017-04-29 DIAGNOSIS — R531 Weakness: Secondary | ICD-10-CM | POA: Diagnosis not present

## 2017-04-29 DIAGNOSIS — I482 Chronic atrial fibrillation: Secondary | ICD-10-CM | POA: Diagnosis not present

## 2017-04-29 DIAGNOSIS — R5383 Other fatigue: Secondary | ICD-10-CM | POA: Diagnosis not present

## 2017-04-29 DIAGNOSIS — N390 Urinary tract infection, site not specified: Secondary | ICD-10-CM | POA: Diagnosis not present

## 2017-04-29 LAB — COAGUCHEK XS/INR WAIVED
INR: 2.5 — AB (ref 0.9–1.1)
Prothrombin Time: 30.3 s

## 2017-04-29 NOTE — Telephone Encounter (Signed)
Please check with [facility] and see if they sent anyone out to this patient's home

## 2017-04-29 NOTE — Telephone Encounter (Signed)
Please check with [facility] to see if any nurses visited this patient's home

## 2017-04-29 NOTE — Patient Instructions (Addendum)
Continue to drink plenty of fluids and stay well hydrated Finish Macrobid We will call Vicente Males with the results of the lab work and urinalysis as soon as these results become available Your niece or Vicente Males should call the insurance company to confirm that a visit was done today at the patient's home If it was, they should be reminded in the future to call a responsible family member to let them know this is going to happen so that someone will be in the home when the patient is visited by the caregiver. The patient should continue to follow-up Dr. Caryl Comes regarding her pacemaker check She can continue with the current Coumadin dose that she is currently unknown.   Anticoagulation Warfarin Dose Instructions as of 04/29/2017      [name] Tue Wed Thu Fri Sat   New Dose 5 mg 2.5 mg 5 mg 5 mg 5 mg 5 mg 5 mg    Description   Continue normal schedule of 1 tablet daily except 1/2 tablet on Mondays.   INR 2.5 today   Return on 04/29/17 for appointment with Dr. Laurance Flatten.

## 2017-04-29 NOTE — Progress Notes (Signed)
Subjective:    Patient ID: [name], female    DOB: 03/07/1917, 83 y.o.   MRN: [id number]  HPI Patient here today for weakness and fatigue.The patient comes to the visit today with her caregiver. She complains of weakness and fatigue. She is currently on Macrobid for a urinary tract infection. She also has a pacemaker check schedule with Dr. Caryl Comes and she would like to postpone that and we will do this for her. She is requesting that she wait a little while on the flu shot and we will go along with that and ask her to come back before the end of the month to get her flu shot. Her vital signs today are stable and she is not running any fever. The patient comes to the visit today in her wheelchair. She is still not completed all of her antibiotic for the urinary tract infection that was prescribed even though she did not have a lot of symptoms with this. She understands that the antibiotic could be making her feel bad. She denies any chest pain or shortness of breath other than shortness of breath associated with weakness. She denies any cough or congestion. She denies any trouble with her stomach including nausea vomiting diarrhea or blood in the stool. She does have dark bowel movements because she is taking iron for iron deficiency anemia. She is not having any symptoms with passing her water today and still has 3-4 more antibiotics to take. She understands the importance of taking these with food. She will get a urine specimen today in addition to the blood work.    Patient Active Problem List   Diagnosis Date Noted  . Permanent atrial fibrillation ([place]) 03/17/2017  . Abdominal aortic atherosclerosis ([place]) 05/29/2016  . Ventral hernia 12/08/2013  . Vitamin D deficiency 12/08/2013  . Breast cancer ([place]) 12/08/2013  . Long term current use of anticoagulant therapy 05/05/2013  . Shortness of breath 05/02/2013  . Restless leg syndrome 10/12/2010  . CAROTID BRUIT 08/21/2010  . MITRAL  REGURGITATION 08/01/2009  . HYPERTENSION, BENIGN 08/17/2008  . AV BLOCK, COMPLETE 08/17/2008  . ATRIAL FIBRILLATION 08/17/2008  . PACEMAKER, PERMANENT 08/17/2008   Outpatient Encounter Prescriptions as of 04/29/2017  Medication Sig  . b complex vitamins tablet Take 1 tablet by mouth daily.   . Cholecalciferol (VITAMIN D3) 1000 UNITS tablet Take 2,000 Units by mouth daily.    . Fe Fum-FePoly-Vit C-Vit B3 (INTEGRA) 62.5-62.5-40-3 MG CAPS TAKE ONE CAPSULE BY MOUTH ONCE DAILY.  . fish oil-omega-3 fatty acids 1000 MG capsule Take 2 g by mouth daily.  . furosemide (LASIX) 20 MG tablet Take 1 tablet (20 mg total) by mouth daily as needed.  . lactose free nutrition (BOOST) LIQD Take 237 mLs by mouth daily.  . nitrofurantoin, macrocrystal-monohydrate, (MACROBID) 100 MG capsule Take 1 capsule (100 mg total) by mouth 2 (two) times daily.  . polyethylene glycol powder (GLYCOLAX/MIRALAX) powder Take 17 g by mouth 2 (two) times daily.  . vitamin C (ASCORBIC ACID) 500 MG tablet Take 500 mg by mouth daily.    . vitamin E 400 UNIT capsule Take 400 Units by mouth daily.  Marland Kitchen warfarin (COUMADIN) 5 MG tablet TAKE 1/2 OR 1 TABLET BY MOUTH ONCE A DAY OR AS INSTRUCTED BY COUMADIN ANTICOAGULATION CLINIC   No facility-administered encounter medications on file as of 04/29/2017.       Review of Systems  Constitutional: Positive for fatigue.  HENT: Negative.   Eyes: Negative.   Respiratory:  Negative.   Cardiovascular: Negative.   Gastrointestinal: Negative.   Endocrine: Negative.   Genitourinary: Negative.   Musculoskeletal: Negative.   Skin: Negative.   Allergic/Immunologic: Negative.   Neurological: Positive for weakness.  Hematological: Negative.   Psychiatric/Behavioral: Negative.        Objective:   Physical Exam  Constitutional: She is oriented to person, place, and time. She appears well-developed and well-nourished. No distress.  Elderly, but alert and answering questions appropriately.  HENT:   Head: Normocephalic and atraumatic.  Right Ear: External ear normal.  Left Ear: External ear normal.  Nose: Nose normal.  Mouth/Throat: Oropharynx is clear and moist. No oropharyngeal exudate.  No sign of any recent nasal bleed  Eyes: Pupils are equal, round, and reactive to light. Conjunctivae and EOM are normal. Right eye exhibits no discharge. Left eye exhibits no discharge. No scleral icterus.  Neck: Normal range of motion. Neck supple. No thyromegaly present.  Cardiovascular: Normal rate, regular rhythm and intact distal pulses.   Murmur heard. The rhythm was regular at 01/U Grade 3/6 systolic ejection murmur  Pulmonary/Chest: Effort normal and breath sounds normal. No respiratory distress. She has no wheezes. She has no rales.  Abdominal: Soft. Bowel sounds are normal. She exhibits no mass. There is no tenderness. There is no rebound and no guarding.  No suprapubic tenderness  Musculoskeletal: She exhibits no edema.  Patient was in a wheelchair during the visit  Lymphadenopathy:    She has no cervical adenopathy.  Neurological: She is alert and oriented to person, place, and time.  Skin: Skin is warm and dry. No rash noted.  Psychiatric: She has a normal mood and affect. Her behavior is normal. Judgment and thought content normal.  Nursing note and vitals reviewed.  BP 140/83 (BP Location: Left Arm)   Pulse 76   Temp (!) 97.2 F (36.2 C) (Oral)   Ht 5' 5" (1.651 m)   Wt 130 lb (59 kg)   BMI 21.63 kg/m         Assessment & Plan:  1. Fatigue, unspecified type -We will call Vicente Males, with these results - CMP14+EGFR - CBC with Differential/Platelet - Thyroid Panel With TSH - CoaguChek XS/INR Waived  2. Weakness -The weakness may be due to taking the antibiotic. The weakness may be due to the urinary tract infection. We will wait until all the results are returned to make a final determination regarding this. - CMP14+EGFR - CBC with Differential/Platelet - Thyroid  Panel With TSH - CoaguChek XS/INR Waived  3. Permanent atrial fibrillation (HCC) -The caregiver will be given the phone number for Dr. Caryl Comes to reschedule the pacemaker check visit -She will continue the Coumadin as she is currently doing as the INR today was 2.5. - CoaguChek XS/INR Waived  4. Urinary tract infection without hematuria, site unspecified -Repeat urinalysis today with urine culture and sensitivity and patient will continue and complete the antibiotic that she has left. -She will also continue to drink plenty of fluids and stay well hydrated.  Patient Instructions  Continue to drink plenty of fluids and stay well hydrated Finish Macrobid We will call Vicente Males with the results of the lab work and urinalysis as soon as these results become available Your niece or Vicente Males should call the insurance company to confirm that a visit was done today at the patient's home If it was, they should be reminded in the future to call a responsible family member to let them know this is going to happen so  that someone will be in the home when the patient is visited by the caregiver. The patient should continue to follow-up Dr. Caryl Comes regarding her pacemaker check She can continue with the current Coumadin dose that she is currently unknown.  Arrie Senate MD

## 2017-04-30 ENCOUNTER — Ambulatory Visit (INDEPENDENT_AMBULATORY_CARE_PROVIDER_SITE_OTHER): Payer: Medicare Other | Admitting: *Deleted

## 2017-04-30 ENCOUNTER — Telehealth: Payer: Self-pay | Admitting: Cardiology

## 2017-04-30 DIAGNOSIS — I4821 Permanent atrial fibrillation: Secondary | ICD-10-CM

## 2017-04-30 DIAGNOSIS — I482 Chronic atrial fibrillation: Secondary | ICD-10-CM | POA: Diagnosis not present

## 2017-04-30 DIAGNOSIS — I442 Atrioventricular block, complete: Secondary | ICD-10-CM

## 2017-04-30 LAB — CBC WITH DIFFERENTIAL/PLATELET
Basophils Absolute: 0 10*3/uL (ref 0.0–0.2)
Basos: 1 %
EOS (ABSOLUTE): 0.1 10*3/uL (ref 0.0–0.4)
EOS: 2 %
HEMATOCRIT: 33.4 % — AB (ref 34.0–46.6)
HEMOGLOBIN: 11.3 g/dL (ref 11.1–15.9)
IMMATURE GRANS (ABS): 0 10*3/uL (ref 0.0–0.1)
IMMATURE GRANULOCYTES: 0 %
LYMPHS: 26 %
Lymphocytes Absolute: 1.3 10*3/uL (ref 0.7–3.1)
MCH: 30.1 pg (ref 26.6–33.0)
MCHC: 33.8 g/dL (ref 31.5–35.7)
MCV: 89 fL (ref 79–97)
MONOCYTES: 14 %
Monocytes Absolute: 0.7 10*3/uL (ref 0.1–0.9)
NEUTROS ABS: 2.8 10*3/uL (ref 1.4–7.0)
NEUTROS PCT: 57 %
PLATELETS: 208 10*3/uL (ref 150–379)
RBC: 3.76 x10E6/uL — ABNORMAL LOW (ref 3.77–5.28)
RDW: 14.5 % (ref 12.3–15.4)
WBC: 4.8 10*3/uL (ref 3.4–10.8)

## 2017-04-30 LAB — CMP14+EGFR
A/G RATIO: 1.8 (ref 1.2–2.2)
ALT: 12 IU/L (ref 0–32)
AST: 22 IU/L (ref 0–40)
Albumin: 4.6 g/dL (ref 3.2–4.6)
Alkaline Phosphatase: 48 IU/L (ref 39–117)
BUN/Creatinine Ratio: 26 (ref 12–28)
BUN: 22 mg/dL (ref 10–36)
Bilirubin Total: 0.9 mg/dL (ref 0.0–1.2)
CALCIUM: 10.1 mg/dL (ref 8.7–10.3)
CO2: 26 mmol/L (ref 20–29)
CREATININE: 0.84 mg/dL (ref 0.57–1.00)
Chloride: 94 mmol/L — ABNORMAL LOW (ref 96–106)
GFR calc non Af Amer: 58 mL/min/{1.73_m2} — ABNORMAL LOW (ref >59)
GFR, EST AFRICAN AMERICAN: 67 mL/min/{1.73_m2} (ref >59)
Globulin, Total: 2.5 g/dL (ref 1.5–4.5)
Glucose: 137 mg/dL — ABNORMAL HIGH (ref 65–99)
Potassium: 4.5 mmol/L (ref 3.5–5.2)
Sodium: 135 mmol/L (ref 134–144)
TOTAL PROTEIN: 7.1 g/dL (ref 6.0–8.5)

## 2017-04-30 LAB — THYROID PANEL WITH TSH
FREE THYROXINE INDEX: 1.4 (ref 1.2–4.9)
T3 UPTAKE RATIO: 27 % (ref 24–39)
T4, Total: 5.1 ug/dL (ref 4.5–12.0)
TSH: 3.66 u[IU]/mL (ref 0.450–4.500)

## 2017-04-30 LAB — URINE CULTURE

## 2017-04-30 NOTE — Telephone Encounter (Signed)
Confirmed remote transmission w/ pt great grand daugher .

## 2017-04-30 NOTE — Telephone Encounter (Signed)
Spoke with [facility] Pt is not active so no one from [facility] has gone out to see pt Also contacted pt's secondary insurance and no one there has been out to see pt

## 2017-04-30 NOTE — Telephone Encounter (Signed)
Spoke with pt's caregiver regarding incident The family will contact law enforcement to advise of incident

## 2017-05-02 ENCOUNTER — Emergency Department (HOSPITAL_COMMUNITY)
Admission: EM | Admit: 2017-05-02 | Discharge: 2017-05-02 | Disposition: A | Discharge status: Home / Self Care | Payer: Medicare Other | Attending: Emergency Medicine | Admitting: Emergency Medicine

## 2017-05-02 ENCOUNTER — Emergency Department (HOSPITAL_COMMUNITY): Payer: Medicare Other

## 2017-05-02 DIAGNOSIS — I5032 Chronic diastolic (congestive) heart failure: Secondary | ICD-10-CM | POA: Insufficient documentation

## 2017-05-02 DIAGNOSIS — R531 Weakness: Secondary | ICD-10-CM

## 2017-05-02 DIAGNOSIS — Z950 Presence of cardiac pacemaker: Secondary | ICD-10-CM | POA: Diagnosis not present

## 2017-05-02 DIAGNOSIS — R0602 Shortness of breath: Secondary | ICD-10-CM | POA: Diagnosis not present

## 2017-05-02 DIAGNOSIS — I5031 Acute diastolic (congestive) heart failure: Secondary | ICD-10-CM | POA: Insufficient documentation

## 2017-05-02 DIAGNOSIS — Z8679 Personal history of other diseases of the circulatory system: Secondary | ICD-10-CM | POA: Insufficient documentation

## 2017-05-02 DIAGNOSIS — Z79899 Other long term (current) drug therapy: Secondary | ICD-10-CM | POA: Diagnosis not present

## 2017-05-02 DIAGNOSIS — I110 Hypertensive heart disease with heart failure: Secondary | ICD-10-CM | POA: Insufficient documentation

## 2017-05-02 DIAGNOSIS — R5383 Other fatigue: Secondary | ICD-10-CM | POA: Insufficient documentation

## 2017-05-02 DIAGNOSIS — Z7901 Long term (current) use of anticoagulants: Secondary | ICD-10-CM | POA: Diagnosis not present

## 2017-05-02 LAB — COMPREHENSIVE METABOLIC PANEL
ALBUMIN: 4.4 g/dL (ref 3.5–5.0)
ALK PHOS: 49 U/L (ref 38–126)
ALT: 22 U/L (ref 14–54)
AST: 35 U/L (ref 15–41)
Anion gap: 11 (ref 5–15)
BILIRUBIN TOTAL: 1.3 mg/dL — AB (ref 0.3–1.2)
BUN: 22 mg/dL — AB (ref 6–20)
CO2: 29 mmol/L (ref 22–32)
CREATININE: 0.72 mg/dL (ref 0.44–1.00)
Calcium: 9.9 mg/dL (ref 8.9–10.3)
Chloride: 92 mmol/L — ABNORMAL LOW (ref 101–111)
GFR calc Af Amer: >60 mL/min (ref >60)
GLUCOSE: 112 mg/dL — AB (ref 65–99)
Potassium: 4.6 mmol/L (ref 3.5–5.1)
Sodium: 132 mmol/L — ABNORMAL LOW (ref 135–145)
TOTAL PROTEIN: 7.8 g/dL (ref 6.5–8.1)

## 2017-05-02 LAB — CBC WITH DIFFERENTIAL/PLATELET
BASOS ABS: 0 10*3/uL (ref 0.0–0.1)
BASOS PCT: 1 %
Eosinophils Absolute: 0.2 10*3/uL (ref 0.0–0.7)
Eosinophils Relative: 3 %
HEMATOCRIT: 35.3 % — AB (ref 36.0–46.0)
HEMOGLOBIN: 11.7 g/dL — AB (ref 12.0–15.0)
Lymphocytes Relative: 20 %
Lymphs Abs: 1.2 10*3/uL (ref 0.7–4.0)
MCH: 30.1 pg (ref 26.0–34.0)
MCHC: 33.1 g/dL (ref 30.0–36.0)
MCV: 90.7 fL (ref 78.0–100.0)
MONO ABS: 0.9 10*3/uL (ref 0.1–1.0)
Monocytes Relative: 15 %
NEUTROS ABS: 3.7 10*3/uL (ref 1.7–7.7)
NEUTROS PCT: 61 %
Platelets: 201 10*3/uL (ref 150–400)
RBC: 3.89 MIL/uL (ref 3.87–5.11)
RDW: 13.7 % (ref 11.5–15.5)
WBC: 5.9 10*3/uL (ref 4.0–10.5)

## 2017-05-02 LAB — URINALYSIS, ROUTINE W REFLEX MICROSCOPIC
Bilirubin Urine: NEGATIVE
Glucose, UA: NEGATIVE mg/dL
HGB URINE DIPSTICK: NEGATIVE
Ketones, ur: NEGATIVE mg/dL
Leukocytes, UA: NEGATIVE
Nitrite: NEGATIVE
Protein, ur: NEGATIVE mg/dL
Specific Gravity, Urine: 1.005 (ref 1.005–1.030)
pH: 7 (ref 5.0–8.0)

## 2017-05-02 LAB — PROTIME-INR
INR: 2.24
Prothrombin Time: 24.6 seconds — ABNORMAL HIGH (ref 11.4–15.2)

## 2017-05-02 LAB — POC OCCULT BLOOD, ED: Fecal Occult Bld: NEGATIVE

## 2017-05-02 MED ORDER — SODIUM CHLORIDE 0.9 % IV BOLUS (SEPSIS)
500.0000 mL | Freq: Once | INTRAVENOUS | Status: AC
  Administered 2017-05-02: 500 mL via INTRAVENOUS

## 2017-05-02 NOTE — Discharge Instructions (Signed)
Your urine, CXR and blood work is reassuring. Get rest and drink fluids. Please see PCP for follow-up in the next few days.  Return without fail for worsening symptoms, including fever, confusion, difficulty walking, or any other symptoms concerning to you.

## 2017-05-02 NOTE — ED Notes (Signed)
PT/ INR reordered. Blood had hemolyzed

## 2017-05-02 NOTE — ED Triage Notes (Signed)
Pt reports generalized weakness, worsening yesterday.  Finished Macrobid yesterday for UTI.

## 2017-05-02 NOTE — ED Provider Notes (Signed)
[facility] DEPT Provider Note   CSN: [id number] Arrival date & time: 05/02/17  1345     History   Chief Complaint Chief Complaint  Patient presents with  . Weakness    HPI [name] is a 83 y.o. female.  HPI 83 year old female who presents with weakness and fatigue. History of atrial fibrillation on coumadin, diastolic heart failure, complete heart block with permanent pacemaker, HTN, and HLD. Reports generalized weakness yesterday. Unable to sleep last night, and reports tossing and turning. Has been ambulatory with walker, which is baseline. Seen by NP 3 days ago, with reassuring blood work. Had been taking macrobid for UTI, which she finished yesterday. No fever, chills, nausea, vomiting, abdominal pain, chest pain, shortness of breath, dysuria, urinary frequency, falls, confusion, focal numbness or weakness. Has black stools from iron chronically.    Past Medical History:  Diagnosis Date  . Atrial fibrillation ([place])   . Carotid bruit   . Cataract   . Colon disorder    Adhesions  . Heart failure, diastolic ([place])   . Hyperlipidemia    statin intolerant  . Hypertension   . Mitral regurgitation    Moderate  . Nephrolithiasis   . Ovarian cyst   . QT prolongation    With solatol  . Restless leg syndrome   . S/P AV nodal ablation     Patient Active Problem List   Diagnosis Date Noted  . Permanent atrial fibrillation ([place]) 03/17/2017  . Abdominal aortic atherosclerosis ([place]) 05/29/2016  . Ventral hernia 12/08/2013  . Vitamin D deficiency 12/08/2013  . Breast cancer ([place]) 12/08/2013  . Long term current use of anticoagulant therapy 05/05/2013  . Shortness of breath 05/02/2013  . Restless leg syndrome 10/12/2010  . CAROTID BRUIT 08/21/2010  . MITRAL REGURGITATION 08/01/2009  . HYPERTENSION, BENIGN 08/17/2008  . AV BLOCK, COMPLETE 08/17/2008  . ATRIAL FIBRILLATION 08/17/2008  . PACEMAKER, PERMANENT 08/17/2008    Past Surgical History:  Procedure  Laterality Date  . ABDOMINAL ADHESION SURGERY     twice  . BREAST SURGERY Left 2010   lumpectomy  . CATARACT EXTRACTION     Bilaterally  . COLON SURGERY     Colonic obstruction secondary to diverticulitis with resection  . CYSTECTOMY     Ovarian  . EYE SURGERY    . JOINT REPLACEMENT Right 2004   hip  . PACEMAKER INSERTION     [employer]  . PERMANENT PACEMAKER GENERATOR CHANGE N/A 07/22/2012   Procedure: PERMANENT PACEMAKER GENERATOR CHANGE;  Surgeon: Deboraha Sprang, MD;  Location: [facility] CATH LAB;  Service: Cardiovascular;  Laterality: N/A;    OB History    No data available       Home Medications    Prior to Admission medications   Medication Sig Start Date End Date Taking? Authorizing Provider  b complex vitamins tablet Take 1 tablet by mouth daily.     [provider]  Cholecalciferol (VITAMIN D3) 1000 UNITS tablet Take 2,000 Units by mouth daily.      [provider]  Fe Fum-FePoly-Vit C-Vit B3 (INTEGRA) 62.5-62.5-40-3 MG CAPS TAKE ONE CAPSULE BY MOUTH ONCE DAILY. 01/23/17   Hassell Done Mary-Margaret, FNP  fish oil-omega-3 fatty acids 1000 MG capsule Take 2 g by mouth daily.    [provider]  furosemide (LASIX) 20 MG tablet Take 1 tablet (20 mg total) by mouth daily as needed. 11/29/14   Cherre Robins, PharmD  lactose free nutrition (BOOST) LIQD Take 237 mLs by  mouth daily. 12/18/16   Chipper Herb, MD  nitrofurantoin, macrocrystal-monohydrate, (MACROBID) 100 MG capsule Take 1 capsule (100 mg total) by mouth 2 (two) times daily. 04/24/17   Claretta Fraise, MD  polyethylene glycol powder (GLYCOLAX/MIRALAX) powder Take 17 g by mouth 2 (two) times daily. 05/29/16   Chipper Herb, MD  vitamin C (ASCORBIC ACID) 500 MG tablet Take 500 mg by mouth daily.      [provider]  vitamin E 400 UNIT capsule Take 400 Units by mouth daily.    [provider]  warfarin (COUMADIN) 5 MG tablet TAKE 1/2 OR 1 TABLET BY MOUTH ONCE A DAY OR AS INSTRUCTED BY  COUMADIN ANTICOAGULATION CLINIC 03/02/17   Chipper Herb, MD    Family History Family History  Problem Relation Age of Onset  . Diabetes Mother        type 2  . Diabetes Father        type 2  . Cancer Sister 32       colon    Social History Social History  Substance Use Topics  . Smoking status: Never Smoker  . Smokeless tobacco: Never Used  . Alcohol use No     Allergies   Atorvastatin; Boniva [ibandronate sodium]; Chocolate; Lovastatin; Pravastatin sodium; Risedronate sodium; Toprol xl [metoprolol succinate]; Visken [pindolol]; Welchol [colesevelam hcl]; and Vesicare [solifenacin]   Review of Systems Review of Systems  Constitutional: Negative for fever.  Respiratory: Negative for cough and shortness of breath.   Cardiovascular: Negative for chest pain and leg swelling.  Gastrointestinal: Negative for abdominal pain.  Genitourinary: Negative for dysuria and frequency.  All other systems reviewed and are negative.    Physical Exam Updated Vital Signs BP (!) 146/86   Pulse 70   Temp 98.1 F (36.7 C) (Oral)   Resp 18   Ht 5\' 5"  (1.651 m)   Wt 59 kg (130 lb)   SpO2 98%   BMI 21.63 kg/m   Physical Exam Physical Exam  Nursing note and vitals reviewed. Constitutional: Well developed, well nourished, non-toxic, and in no acute distress Head: Normocephalic and atraumatic.  Mouth/Throat: Oropharynx is clear and moist.  Neck: Normal range of motion. Neck supple.  Cardiovascular: Normal rate and regular rhythm.   Pulmonary/Chest: Effort normal and breath sounds normal.  Abdominal: Soft. There is no tenderness. There is no rebound and no guarding.  Musculoskeletal: Normal range of motion.  Neurological: Alert, no facial droop, fluent speech, no dysmetria with finger to nose, PERRL, EOMI, no pronator drift, no drift of lower extremities against gravity. Sensation to light touch in tact throughout face and extremities Skin: Skin is warm and dry.  Psychiatric:  Cooperative   ED Treatments / Results  Labs (all labs ordered are listed, but only abnormal results are displayed) Labs Reviewed  CBC WITH DIFFERENTIAL/PLATELET - Abnormal; Notable for the following:       Result Value   Hemoglobin 11.7 (*)    HCT 35.3 (*)    All other components within normal limits  COMPREHENSIVE METABOLIC PANEL - Abnormal; Notable for the following:    Sodium 132 (*)    Chloride 92 (*)    Glucose, Bld 112 (*)    BUN 22 (*)    Total Bilirubin 1.3 (*)    All other components within normal limits  URINALYSIS, ROUTINE W REFLEX MICROSCOPIC - Abnormal; Notable for the following:    APPearance HAZY (*)    All other components within normal limits  PROTIME-INR  POC OCCULT BLOOD, ED    EKG  EKG Interpretation  Date/Time:  Saturday May 02 2017 14:51:17 EDT Ventricular Rate:  70 PR Interval:    QRS Duration: 146 QT Interval:  451 QTC Calculation: 487 R Axis:   -64 Text Interpretation:  Ventricular-paced rhythm No further analysis attempted due to paced rhythm similar to previous EKG  v-paced rhythm  Confirmed by Brantley Stage [phone]) on 05/02/2017 3:38:21 PM       Radiology Dg Chest 2 View  Result Date: 05/02/2017 CLINICAL DATA:  Short of breath and weakness. Recent completion of antibiotics for urinary tract infection. EXAM: CHEST  2 VIEW COMPARISON:  08/23/2015 FINDINGS: Cardiac silhouette is mildly enlarged. No mediastinal or hilar masses. No convincing adenopathy. Mildly thickened interstitial markings most evident in the peripheral lung bases. No evidence of pneumonia. No convincing pulmonary edema. No pleural effusion or pneumothorax. Left anterior chest wall single lead pacemaker is stable. Skeletal structures are demineralized but grossly intact. IMPRESSION: No acute cardiopulmonary disease. Electronically Signed   By: Lajean Manes M.D.   On: 05/02/2017 16:00    Procedures Procedures (including critical care time)  Medications Ordered in  ED Medications  sodium chloride 0.9 % bolus 500 mL (500 mLs Intravenous New Bag/Given 05/02/17 1557)     Initial Impression / Assessment and Plan / ED Course  I have reviewed the triage vital signs and the nursing notes.  Pertinent labs & imaging results that were available during my care of the patient were reviewed by me and considered in my medical decision making (see chart for details).     Presents with generalized weakness for one day. Records reviewed, and seen by PCP 3-4 days ago with reassuring blood work, including thyroid studies. Her mentation is normal. No neurological deficits. No major electrolyte or metabolic derangements. No signs of UTI that is persistent and no evidence of pneumonia or other acute cardiopulmonary processes on CXR. Stool is occult negative and no signs of GI bleeding. At this time, unclear etiology of her weakness. Given IVF as possible subtle dehydration or may be side effect of recent antibiotics. At this time, no systemic signs or symptoms of illness. Has been ambulatory. No indications for admission. Patient lives with great granddaughter. Will return for any worsening or new symptoms. Will follow-up on Monday with PCP for recheck. Strict return and follow-up instructions reviewed. She and family expressed understanding of all discharge instructions and felt comfortable with the plan of care.   Final Clinical Impressions(s) / ED Diagnoses   Final diagnoses:  Generalized weakness    New Prescriptions New Prescriptions   No medications on file     Forde Dandy, MD 05/02/17 1620

## 2017-05-02 NOTE — ED Notes (Signed)
Patient transported to X-ray 

## 2017-05-06 NOTE — Progress Notes (Signed)
Remote pacemaker transmission.   

## 2017-05-07 ENCOUNTER — Encounter: Payer: Self-pay | Admitting: Cardiology

## 2017-05-07 ENCOUNTER — Other Ambulatory Visit: Payer: Self-pay | Admitting: Nurse Practitioner

## 2017-05-07 LAB — CUP PACEART REMOTE DEVICE CHECK
Brady Statistic RV Percent Paced: 99 %
Implantable Lead Implant Date: 20020418
Implantable Lead Location: 753860
Implantable Pulse Generator Implant Date: 20131226
Lead Channel Impedance Value: 450 Ohm
Lead Channel Pacing Threshold Amplitude: 0.625 V
Lead Channel Pacing Threshold Pulse Width: 0.4 ms
Lead Channel Sensing Intrinsic Amplitude: 12 mV
Lead Channel Setting Pacing Amplitude: 0.875
Lead Channel Setting Pacing Pulse Width: 0.4 ms
MDC IDC MSMT BATTERY REMAINING LONGEVITY: 147 mo
MDC IDC MSMT BATTERY REMAINING PERCENTAGE: 95.5 %
MDC IDC MSMT BATTERY VOLTAGE: 2.96 V
MDC IDC SESS DTM: 20181004184134
MDC IDC SET LEADCHNL RV SENSING SENSITIVITY: 4 mV
Pulse Gen Model: 1110
Pulse Gen Serial Number: 7331332

## 2017-05-15 ENCOUNTER — Other Ambulatory Visit: Payer: Medicare Other

## 2017-05-15 DIAGNOSIS — R300 Dysuria: Secondary | ICD-10-CM | POA: Diagnosis not present

## 2017-05-15 LAB — URINALYSIS, COMPLETE
BILIRUBIN UA: NEGATIVE
Glucose, UA: NEGATIVE
Ketones, UA: NEGATIVE
LEUKOCYTES UA: NEGATIVE
Nitrite, UA: NEGATIVE
PH UA: 8.5 — AB (ref 5.0–7.5)
Protein, UA: NEGATIVE
RBC UA: NEGATIVE
Specific Gravity, UA: 1.015 (ref 1.005–1.030)
Urobilinogen, Ur: 0.2 mg/dL (ref 0.2–1.0)

## 2017-05-15 LAB — MICROSCOPIC EXAMINATION
BACTERIA UA: NONE SEEN
RENAL EPITHEL UA: NONE SEEN /HPF
WBC UA: NONE SEEN /HPF (ref 0–?)

## 2017-05-16 LAB — URINE CULTURE

## 2017-06-01 ENCOUNTER — Ambulatory Visit (INDEPENDENT_AMBULATORY_CARE_PROVIDER_SITE_OTHER): Payer: Medicare Other | Admitting: Family Medicine

## 2017-06-01 ENCOUNTER — Encounter: Payer: Self-pay | Admitting: Family Medicine

## 2017-06-01 VITALS — BP 136/78 | HR 73 | Temp 98.7°F | Ht 65.0 in | Wt 130.0 lb

## 2017-06-01 DIAGNOSIS — R7989 Other specified abnormal findings of blood chemistry: Secondary | ICD-10-CM | POA: Diagnosis not present

## 2017-06-01 DIAGNOSIS — D509 Iron deficiency anemia, unspecified: Secondary | ICD-10-CM | POA: Diagnosis not present

## 2017-06-01 DIAGNOSIS — R531 Weakness: Secondary | ICD-10-CM

## 2017-06-01 DIAGNOSIS — E559 Vitamin D deficiency, unspecified: Secondary | ICD-10-CM

## 2017-06-01 DIAGNOSIS — Z23 Encounter for immunization: Secondary | ICD-10-CM

## 2017-06-01 DIAGNOSIS — Z7901 Long term (current) use of anticoagulants: Secondary | ICD-10-CM | POA: Diagnosis not present

## 2017-06-01 DIAGNOSIS — I4821 Permanent atrial fibrillation: Secondary | ICD-10-CM

## 2017-06-01 DIAGNOSIS — R2681 Unsteadiness on feet: Secondary | ICD-10-CM

## 2017-06-01 DIAGNOSIS — I10 Essential (primary) hypertension: Secondary | ICD-10-CM

## 2017-06-01 DIAGNOSIS — R29898 Other symptoms and signs involving the musculoskeletal system: Secondary | ICD-10-CM | POA: Diagnosis not present

## 2017-06-01 DIAGNOSIS — I482 Chronic atrial fibrillation: Secondary | ICD-10-CM

## 2017-06-01 DIAGNOSIS — E7800 Pure hypercholesterolemia, unspecified: Secondary | ICD-10-CM

## 2017-06-01 LAB — COAGUCHEK XS/INR WAIVED
INR: 2.6 — ABNORMAL HIGH (ref 0.9–1.1)
Prothrombin Time: 30.7 s

## 2017-06-01 MED ORDER — WARFARIN SODIUM 5 MG PO TABS: ORAL_TABLET | ORAL | 3 refills | Status: DC

## 2017-06-01 NOTE — Progress Notes (Signed)
Subjective:    Patient ID: [name], female    DOB: 01-11-1917, 83 y.o.   MRN: [id number]  HPI Pt here for follow up and management of chronic medical problems which includes hypertension, a fib, and hyperlipidemia. She is taking medication regularly.  The patient comes to the visit today with her great granddaughter Evelena Peat who lives with her.  The patient denies any chest pain or any more shortness of breath than usual.  She has no trouble with swallowing heartburn indigestion nausea vomiting diarrhea or blood in the stool.  She does take iron regularly and her stools are dark in color.  She is passing her water without problems.  She tries to drink plenty of fluids.  She feels better when her protimes are between 2.0 and 2.5 and no higher than 2.5.  She is currently taking 1 extra strength Tylenol daily if needed for generalized pain.    Patient Active Problem List   Diagnosis Date Noted  . Permanent atrial fibrillation ([place]) 03/17/2017  . Abdominal aortic atherosclerosis ([place]) 05/29/2016  . Ventral hernia 12/08/2013  . Vitamin D deficiency 12/08/2013  . Breast cancer ([place]) 12/08/2013  . Long term current use of anticoagulant therapy 05/05/2013  . Shortness of breath 05/02/2013  . Restless leg syndrome 10/12/2010  . CAROTID BRUIT 08/21/2010  . MITRAL REGURGITATION 08/01/2009  . HYPERTENSION, BENIGN 08/17/2008  . AV BLOCK, COMPLETE 08/17/2008  . ATRIAL FIBRILLATION 08/17/2008  . PACEMAKER, PERMANENT 08/17/2008   Outpatient Encounter Medications as of 06/01/2017  Medication Sig  . b complex vitamins tablet Take 1 tablet by mouth daily.   . Cholecalciferol (VITAMIN D3) 1000 UNITS tablet Take 2,000 Units by mouth daily.    . Fe Fum-FePoly-Vit C-Vit B3 (INTEGRA) 62.5-62.5-40-3 MG CAPS TAKE ONE CAPSULE BY MOUTH ONCE DAILY.  . fish oil-omega-3 fatty acids 1000 MG capsule Take 2 g by mouth daily.  . furosemide (LASIX) 20 MG tablet Take 1 tablet (20 mg total) by mouth daily as  needed.  . lactose free nutrition (BOOST) LIQD Take 237 mLs by mouth daily.  . polyethylene glycol powder (GLYCOLAX/MIRALAX) powder Take 17 g by mouth 2 (two) times daily.  . vitamin C (ASCORBIC ACID) 500 MG tablet Take 500 mg by mouth daily.    . vitamin E 400 UNIT capsule Take 400 Units by mouth daily.  Marland Kitchen warfarin (COUMADIN) 5 MG tablet TAKE 1/2 OR 1 TABLET BY MOUTH ONCE A DAY OR AS INSTRUCTED BY COUMADIN ANTICOAGULATION CLINIC  . [DISCONTINUED] nitrofurantoin, macrocrystal-monohydrate, (MACROBID) 100 MG capsule Take 1 capsule (100 mg total) by mouth 2 (two) times daily.   No facility-administered encounter medications on file as of 06/01/2017.       Review of Systems  HENT: Negative.   Eyes: Negative.   Respiratory: Negative.   Cardiovascular: Negative.   Gastrointestinal: Negative.   Endocrine: Negative.   Genitourinary: Negative.   Musculoskeletal: Positive for myalgias.  Skin: Negative.   Allergic/Immunologic: Negative.   Neurological: Positive for weakness (legs ).  Hematological: Negative.   Psychiatric/Behavioral: Negative.        Objective:   Physical Exam  Constitutional: She is oriented to person, place, and time. She appears well-developed and well-nourished. No distress.  The patient is pleasant and alert especially for her age of 49 years and having just lost her daughter.  HENT:  Head: Normocephalic and atraumatic.  Right Ear: External ear normal.  Left Ear: External ear normal.  Nose: Nose normal.  Mouth/Throat: Oropharynx is clear  and moist. No oropharyngeal exudate.  Eyes: Conjunctivae and EOM are normal. Pupils are equal, round, and reactive to light. Right eye exhibits no discharge. Left eye exhibits no discharge. No scleral icterus.  Neck: Normal range of motion. Neck supple. No thyromegaly present.  No bruits thyromegaly or anterior cervical adenopathy  Cardiovascular: Normal rate, regular rhythm and normal heart sounds.  No murmur heard. Heart is  regular today at 84/min  Pulmonary/Chest: Effort normal and breath sounds normal. No respiratory distress. She has no wheezes. She has no rales.  Clear anteriorly and posteriorly  Abdominal: Soft. Bowel sounds are normal. She exhibits no mass. There is no tenderness. There is no rebound and no guarding.  No abdominal tenderness masses organ enlargement  Musculoskeletal: She exhibits no edema.  Patient comes to the visit today in her wheelchair  Lymphadenopathy:    She has no cervical adenopathy.  Neurological: She is alert and oriented to person, place, and time. She has normal reflexes. No cranial nerve deficit.  Skin: Skin is warm and dry. No rash noted.  Psychiatric: She has a normal mood and affect. Her behavior is normal. Judgment and thought content normal.  Nursing note and vitals reviewed.   BP 136/78 (BP Location: Right Arm)   Pulse 73   Temp 98.7 F (37.1 C) (Oral)   Ht 5' 5" (1.651 m)   Wt 130 lb (59 kg)   BMI 21.63 kg/m        Assessment & Plan:  1. Permanent atrial fibrillation (HCC) -Rhythm today was regular at 84/min and not irregular irregular. - CoaguChek XS/INR Waived - BMP8+EGFR - CBC with Differential/Platelet  2. HYPERTENSION, BENIGN -The blood pressure was good for this patient at 136/78 and she will continue with her current treatment regimen - BMP8+EGFR - CBC with Differential/Platelet - Hepatic function panel  3. Pure hypercholesterolemia -The patient has hyperlipidemia and has been intolerant to statin drugs and is currently only taking omega-3 fatty acids - CBC with Differential/Platelet - Lipid panel  4. Vitamin D deficiency -Continue with current vitamin D replacement pending results of lab work - CBC with Differential/Platelet - VITAMIN D 25 Hydroxy (Vit-D Deficiency, Fractures)  5. Elevated TSH -Recheck thyroid profile with a history of elevated TSH - CBC with Differential/Platelet  6. Long term current use of anticoagulant  therapy -The patient will continue with her current dose of Coumadin but will take half a pill tomorrow only and then will resume her usual dose after tomorrow.  She likes to keep her INR is at 2.5 or less.  7.  Iron deficiency anemia -Continue with Integra ending results of CBC  Meds ordered this encounter  Medications  . warfarin (COUMADIN) 5 MG tablet    Sig: TAKE 1/2 OR 1 TABLET BY MOUTH ONCE A DAY OR AS INSTRUCTED BY COUMADIN ANTICOAGULATION CLINIC    Dispense:  90 tablet    Refill:  3   Patient Instructions                       Medicare Annual Wellness Visit  [facility] and the medical providers at [facility] strive to bring you the best medical care.  In doing so we not only want to address your current medical conditions and concerns but also to detect new conditions early and prevent illness, disease and health-related problems.    Medicare offers a yearly Wellness Visit which allows our clinical staff to assess your need for preventative services  including immunizations, lifestyle education, counseling to decrease risk of preventable diseases and screening for fall risk and other medical concerns.    This visit is provided free of charge (no copay) for all Medicare recipients. The clinical pharmacists at [facility] have begun to conduct these Wellness Visits which will also include a thorough review of all your medications.    As you primary medical provider recommend that you make an appointment for your Annual Wellness Visit if you have not done so already this year.  You may set up this appointment before you leave today or you may call back ([phone]) and schedule an appointment.  Please make sure when you call that you mention that you are scheduling your Annual Wellness Visit with the clinical pharmacist so that the appointment may be made for the proper length of time.     Continue current medications. Continue good  therapeutic lifestyle changes which include good diet and exercise. Fall precautions discussed with patient. If an FOBT was given today- please return it to our front desk. If you are over 17 years old - you may need Prevnar 51 or the adult Pneumonia vaccine.  **Flu shots are available--- please call and schedule a FLU-CLINIC appointment**  After your visit with [place] today you will receive a survey in the mail or online from [employer] regarding your care with [place]. Please take a moment to fill this out. Your feedback is very important to [place] as you can help [place] better understand your patient needs as well as improve your experience and satisfaction. WE CARE ABOUT YOU!!!   The patient should continue with her Coumadin but tomorrow only take 1/2 pill then resume the previous schedule with 1 pill daily except a half on Monday We will call with lab work results as soon as these results become available We will arrange for home physical therapy to come in the home to help the patient with gait strengthening to keep her from falling. She can increase the extra strength Tylenol 2 to once daily or 1 twice a day if needed  Arrie Senate MD

## 2017-06-01 NOTE — Patient Instructions (Addendum)
Medicare Annual Wellness Visit  [facility] and the medical providers at [facility] strive to bring you the best medical care.  In doing so we not only want to address your current medical conditions and concerns but also to detect new conditions early and prevent illness, disease and health-related problems.    Medicare offers a yearly Wellness Visit which allows our clinical staff to assess your need for preventative services including immunizations, lifestyle education, counseling to decrease risk of preventable diseases and screening for fall risk and other medical concerns.    This visit is provided free of charge (no copay) for all Medicare recipients. The clinical pharmacists at [facility] have begun to conduct these Wellness Visits which will also include a thorough review of all your medications.    As you primary medical provider recommend that you make an appointment for your Annual Wellness Visit if you have not done so already this year.  You may set up this appointment before you leave today or you may call back ([phone]) and schedule an appointment.  Please make sure when you call that you mention that you are scheduling your Annual Wellness Visit with the clinical pharmacist so that the appointment may be made for the proper length of time.     Continue current medications. Continue good therapeutic lifestyle changes which include good diet and exercise. Fall precautions discussed with patient. If an FOBT was given today- please return it to our front desk. If you are over 23 years old - you may need Prevnar 29 or the adult Pneumonia vaccine.  **Flu shots are available--- please call and schedule a FLU-CLINIC appointment**  After your visit with [place] today you will receive a survey in the mail or online from [employer] regarding your care with [place]. Please take a moment to fill this out. Your feedback is very  important to [place] as you can help [place] better understand your patient needs as well as improve your experience and satisfaction. WE CARE ABOUT YOU!!!   The patient should continue with her Coumadin but tomorrow only take 1/2 pill then resume the previous schedule with 1 pill daily except a half on Monday We will call with lab work results as soon as these results become available We will arrange for home physical therapy to come in the home to help the patient with gait strengthening to keep her from falling. She can increase the extra strength Tylenol 2 to once daily or 1 twice a day if needed   Description   Take 0.5 tab this Tuesday  --- then resume back to your normal dose of 1 tablet daily except 1/2 tablet on Mondays.   INR 2.6 today   Return in 4 weeks with Cyril Mourning

## 2017-06-01 NOTE — Addendum Note (Signed)
Addended by: Zannie Cove on: 06/01/2017 04:18 PM   Modules accepted: Orders

## 2017-06-02 LAB — LIPID PANEL
CHOL/HDL RATIO: 3.8 ratio (ref 0.0–4.4)
Cholesterol, Total: 226 mg/dL — ABNORMAL HIGH (ref 100–199)
HDL: 59 mg/dL (ref >39)
LDL CALC: 153 mg/dL — AB (ref 0–99)
Triglycerides: 72 mg/dL (ref 0–149)
VLDL CHOLESTEROL CAL: 14 mg/dL (ref 5–40)

## 2017-06-02 LAB — CBC WITH DIFFERENTIAL/PLATELET
BASOS: 1 %
Basophils Absolute: 0 10*3/uL (ref 0.0–0.2)
EOS (ABSOLUTE): 0.1 10*3/uL (ref 0.0–0.4)
Eos: 2 %
HEMOGLOBIN: 10.8 g/dL — AB (ref 11.1–15.9)
Hematocrit: 32.2 % — ABNORMAL LOW (ref 34.0–46.6)
IMMATURE GRANS (ABS): 0 10*3/uL (ref 0.0–0.1)
Immature Granulocytes: 0 %
LYMPHS ABS: 1.6 10*3/uL (ref 0.7–3.1)
Lymphs: 32 %
MCH: 29.9 pg (ref 26.6–33.0)
MCHC: 33.5 g/dL (ref 31.5–35.7)
MCV: 89 fL (ref 79–97)
MONOCYTES: 15 %
Monocytes Absolute: 0.7 10*3/uL (ref 0.1–0.9)
NEUTROS ABS: 2.5 10*3/uL (ref 1.4–7.0)
Neutrophils: 50 %
Platelets: 211 10*3/uL (ref 150–379)
RBC: 3.61 x10E6/uL — ABNORMAL LOW (ref 3.77–5.28)
RDW: 14.5 % (ref 12.3–15.4)
WBC: 5 10*3/uL (ref 3.4–10.8)

## 2017-06-02 LAB — VITAMIN D 25 HYDROXY (VIT D DEFICIENCY, FRACTURES): VIT D 25 HYDROXY: 40.2 ng/mL (ref 30.0–100.0)

## 2017-06-02 LAB — BMP8+EGFR
BUN / CREAT RATIO: 25 (ref 12–28)
BUN: 20 mg/dL (ref 10–36)
CHLORIDE: 94 mmol/L — AB (ref 96–106)
CO2: 27 mmol/L (ref 20–29)
Calcium: 9.5 mg/dL (ref 8.7–10.3)
Creatinine, Ser: 0.81 mg/dL (ref 0.57–1.00)
GFR, EST AFRICAN AMERICAN: 70 mL/min/{1.73_m2} (ref >59)
GFR, EST NON AFRICAN AMERICAN: 61 mL/min/{1.73_m2} (ref >59)
Glucose: 94 mg/dL (ref 65–99)
POTASSIUM: 4.7 mmol/L (ref 3.5–5.2)
Sodium: 135 mmol/L (ref 134–144)

## 2017-06-02 LAB — HEPATIC FUNCTION PANEL
ALBUMIN: 4.4 g/dL (ref 3.2–4.6)
ALT: 14 IU/L (ref 0–32)
AST: 21 IU/L (ref 0–40)
Alkaline Phosphatase: 47 IU/L (ref 39–117)
Bilirubin Total: 0.8 mg/dL (ref 0.0–1.2)
Bilirubin, Direct: 0.23 mg/dL (ref 0.00–0.40)
Total Protein: 6.9 g/dL (ref 6.0–8.5)

## 2017-06-03 LAB — SPECIMEN STATUS REPORT

## 2017-06-10 ENCOUNTER — Telehealth: Payer: Self-pay | Admitting: Family Medicine

## 2017-06-10 NOTE — Telephone Encounter (Signed)
PT discussed - aware that Alvis Lemmings will call [facility] with initial visit info.

## 2017-06-12 DIAGNOSIS — I482 Chronic atrial fibrillation: Secondary | ICD-10-CM | POA: Diagnosis not present

## 2017-06-12 DIAGNOSIS — R531 Weakness: Secondary | ICD-10-CM | POA: Diagnosis not present

## 2017-06-16 DIAGNOSIS — R531 Weakness: Secondary | ICD-10-CM | POA: Diagnosis not present

## 2017-06-16 DIAGNOSIS — I482 Chronic atrial fibrillation: Secondary | ICD-10-CM | POA: Diagnosis not present

## 2017-06-23 DIAGNOSIS — I482 Chronic atrial fibrillation: Secondary | ICD-10-CM | POA: Diagnosis not present

## 2017-06-23 DIAGNOSIS — R531 Weakness: Secondary | ICD-10-CM | POA: Diagnosis not present

## 2017-06-26 DIAGNOSIS — I482 Chronic atrial fibrillation: Secondary | ICD-10-CM | POA: Diagnosis not present

## 2017-06-26 DIAGNOSIS — R531 Weakness: Secondary | ICD-10-CM | POA: Diagnosis not present

## 2017-06-30 DIAGNOSIS — I482 Chronic atrial fibrillation: Secondary | ICD-10-CM | POA: Diagnosis not present

## 2017-06-30 DIAGNOSIS — R531 Weakness: Secondary | ICD-10-CM | POA: Diagnosis not present

## 2017-07-01 ENCOUNTER — Ambulatory Visit (INDEPENDENT_AMBULATORY_CARE_PROVIDER_SITE_OTHER): Payer: Medicare Other | Admitting: *Deleted

## 2017-07-01 DIAGNOSIS — Z7901 Long term (current) use of anticoagulants: Secondary | ICD-10-CM

## 2017-07-01 DIAGNOSIS — R300 Dysuria: Secondary | ICD-10-CM

## 2017-07-01 DIAGNOSIS — I482 Chronic atrial fibrillation: Secondary | ICD-10-CM | POA: Diagnosis not present

## 2017-07-01 DIAGNOSIS — I4821 Permanent atrial fibrillation: Secondary | ICD-10-CM

## 2017-07-01 LAB — MICROSCOPIC EXAMINATION: RENAL EPITHEL UA: NONE SEEN /HPF

## 2017-07-01 LAB — URINALYSIS, ROUTINE W REFLEX MICROSCOPIC
Bilirubin, UA: NEGATIVE
Glucose, UA: NEGATIVE
Ketones, UA: NEGATIVE
Leukocytes, UA: NEGATIVE
NITRITE UA: NEGATIVE
PH UA: 7 (ref 5.0–7.5)
PROTEIN UA: NEGATIVE
Specific Gravity, UA: 1.015 (ref 1.005–1.030)
UUROB: 0.2 mg/dL (ref 0.2–1.0)

## 2017-07-01 LAB — COAGUCHEK XS/INR WAIVED
INR: 1.9 — AB (ref 0.9–1.1)
Prothrombin Time: 23 s

## 2017-07-01 NOTE — Progress Notes (Signed)
Subjective:     Indication: atrial fibrillation Bleeding signs/symptoms: None Thromboembolic signs/symptoms: None  Missed Coumadin doses: None Medication changes: no Dietary changes: no Bacterial/viral infection: no Other concerns: no  The following portions of the patient's history were reviewed and updated as appropriate: allergies and current medications.  Review of Systems Pertinent items are noted in HPI.   Objective:    INR Today: 1.9  Current dose: 2.5 on Mon and 5mg  all other days  Assessment:    Subtherapeutic INR for goal of 2-3   Plan:    1. New dose: no change   2. Next INR: 1 month    Chong Sicilian, RN

## 2017-07-02 DIAGNOSIS — I482 Chronic atrial fibrillation: Secondary | ICD-10-CM | POA: Diagnosis not present

## 2017-07-02 DIAGNOSIS — R531 Weakness: Secondary | ICD-10-CM | POA: Diagnosis not present

## 2017-07-03 LAB — URINE CULTURE

## 2017-07-07 DIAGNOSIS — I482 Chronic atrial fibrillation: Secondary | ICD-10-CM | POA: Diagnosis not present

## 2017-07-07 DIAGNOSIS — R531 Weakness: Secondary | ICD-10-CM | POA: Diagnosis not present

## 2017-07-07 LAB — THYROID PANEL WITH TSH
Free Thyroxine Index: 1.3 (ref 1.2–4.9)
T3 UPTAKE RATIO: 27 % (ref 24–39)
T4, Total: 4.7 ug/dL (ref 4.5–12.0)
TSH: 4.1 u[IU]/mL (ref 0.450–4.500)

## 2017-07-10 DIAGNOSIS — R531 Weakness: Secondary | ICD-10-CM | POA: Diagnosis not present

## 2017-07-10 DIAGNOSIS — I482 Chronic atrial fibrillation: Secondary | ICD-10-CM | POA: Diagnosis not present

## 2017-07-13 DIAGNOSIS — R531 Weakness: Secondary | ICD-10-CM | POA: Diagnosis not present

## 2017-07-13 DIAGNOSIS — I482 Chronic atrial fibrillation: Secondary | ICD-10-CM | POA: Diagnosis not present

## 2017-07-14 ENCOUNTER — Ambulatory Visit (INDEPENDENT_AMBULATORY_CARE_PROVIDER_SITE_OTHER): Payer: Medicare Other

## 2017-07-14 DIAGNOSIS — I10 Essential (primary) hypertension: Secondary | ICD-10-CM | POA: Diagnosis not present

## 2017-07-14 DIAGNOSIS — R531 Weakness: Secondary | ICD-10-CM | POA: Diagnosis not present

## 2017-07-14 DIAGNOSIS — I482 Chronic atrial fibrillation: Secondary | ICD-10-CM | POA: Diagnosis not present

## 2017-07-14 DIAGNOSIS — E7800 Pure hypercholesterolemia, unspecified: Secondary | ICD-10-CM

## 2017-07-14 DIAGNOSIS — E785 Hyperlipidemia, unspecified: Secondary | ICD-10-CM | POA: Diagnosis not present

## 2017-07-14 DIAGNOSIS — E559 Vitamin D deficiency, unspecified: Secondary | ICD-10-CM | POA: Diagnosis not present

## 2017-07-14 DIAGNOSIS — I700 Atherosclerosis of aorta: Secondary | ICD-10-CM

## 2017-07-14 DIAGNOSIS — I051 Rheumatic mitral insufficiency: Secondary | ICD-10-CM

## 2017-07-14 DIAGNOSIS — D509 Iron deficiency anemia, unspecified: Secondary | ICD-10-CM | POA: Diagnosis not present

## 2017-07-14 DIAGNOSIS — K439 Ventral hernia without obstruction or gangrene: Secondary | ICD-10-CM | POA: Diagnosis not present

## 2017-07-17 DIAGNOSIS — R531 Weakness: Secondary | ICD-10-CM | POA: Diagnosis not present

## 2017-07-17 DIAGNOSIS — I482 Chronic atrial fibrillation: Secondary | ICD-10-CM | POA: Diagnosis not present

## 2017-07-22 DIAGNOSIS — R531 Weakness: Secondary | ICD-10-CM | POA: Diagnosis not present

## 2017-07-22 DIAGNOSIS — I482 Chronic atrial fibrillation: Secondary | ICD-10-CM | POA: Diagnosis not present

## 2017-07-27 DIAGNOSIS — I482 Chronic atrial fibrillation: Secondary | ICD-10-CM | POA: Diagnosis not present

## 2017-07-27 DIAGNOSIS — R531 Weakness: Secondary | ICD-10-CM | POA: Diagnosis not present

## 2017-07-31 ENCOUNTER — Ambulatory Visit (INDEPENDENT_AMBULATORY_CARE_PROVIDER_SITE_OTHER): Payer: Medicare Other | Admitting: Pharmacist Clinician (PhC)/ Clinical Pharmacy Specialist

## 2017-07-31 DIAGNOSIS — I482 Chronic atrial fibrillation: Secondary | ICD-10-CM | POA: Diagnosis not present

## 2017-07-31 DIAGNOSIS — Z7901 Long term (current) use of anticoagulants: Secondary | ICD-10-CM | POA: Diagnosis not present

## 2017-07-31 DIAGNOSIS — I4821 Permanent atrial fibrillation: Secondary | ICD-10-CM

## 2017-07-31 DIAGNOSIS — R531 Weakness: Secondary | ICD-10-CM | POA: Diagnosis not present

## 2017-07-31 LAB — COAGUCHEK XS/INR WAIVED
INR: 1.9 — AB (ref 0.9–1.1)
PROTHROMBIN TIME: 22.4 s

## 2017-07-31 NOTE — Patient Instructions (Signed)
Description   Continue taking 2.5mg  on Mon and 5mg  all other days  INR today is     Goal is 2.0-3.0

## 2017-08-03 DIAGNOSIS — I482 Chronic atrial fibrillation: Secondary | ICD-10-CM | POA: Diagnosis not present

## 2017-08-03 DIAGNOSIS — R531 Weakness: Secondary | ICD-10-CM | POA: Diagnosis not present

## 2017-08-05 ENCOUNTER — Ambulatory Visit (INDEPENDENT_AMBULATORY_CARE_PROVIDER_SITE_OTHER): Payer: Medicare Other | Admitting: *Deleted

## 2017-08-05 ENCOUNTER — Telehealth: Payer: Self-pay | Admitting: Cardiology

## 2017-08-05 DIAGNOSIS — I442 Atrioventricular block, complete: Secondary | ICD-10-CM

## 2017-08-05 NOTE — Telephone Encounter (Signed)
Confirmed remote transmission w/ pt great granddaughter.

## 2017-08-06 ENCOUNTER — Encounter: Payer: Self-pay | Admitting: Cardiology

## 2017-08-06 NOTE — Progress Notes (Signed)
Remote pacemaker transmission.   

## 2017-08-07 DIAGNOSIS — I482 Chronic atrial fibrillation: Secondary | ICD-10-CM | POA: Diagnosis not present

## 2017-08-07 DIAGNOSIS — R531 Weakness: Secondary | ICD-10-CM | POA: Diagnosis not present

## 2017-08-10 DIAGNOSIS — R531 Weakness: Secondary | ICD-10-CM | POA: Diagnosis not present

## 2017-08-10 DIAGNOSIS — I482 Chronic atrial fibrillation: Secondary | ICD-10-CM | POA: Diagnosis not present

## 2017-08-12 ENCOUNTER — Other Ambulatory Visit: Payer: Self-pay | Admitting: Family Medicine

## 2017-08-13 LAB — CUP PACEART REMOTE DEVICE CHECK
Battery Remaining Longevity: 146 mo
Battery Remaining Percentage: 95.5 %
Battery Voltage: 2.96 V
Brady Statistic RV Percent Paced: 99 %
Implantable Lead Model: 5076
Lead Channel Impedance Value: 450 Ohm
Lead Channel Setting Pacing Amplitude: 0.875
Lead Channel Setting Pacing Pulse Width: 0.4 ms
Lead Channel Setting Sensing Sensitivity: 4 mV
MDC IDC LEAD IMPLANT DT: 20020418
MDC IDC LEAD LOCATION: 753860
MDC IDC MSMT LEADCHNL RV PACING THRESHOLD AMPLITUDE: 0.625 V
MDC IDC MSMT LEADCHNL RV PACING THRESHOLD PULSEWIDTH: 0.4 ms
MDC IDC MSMT LEADCHNL RV SENSING INTR AMPL: 12 mV
MDC IDC PG IMPLANT DT: 20131226
MDC IDC PG SERIAL: 7331332
MDC IDC SESS DTM: 20190109195640
Pulse Gen Model: 1110

## 2017-08-14 ENCOUNTER — Emergency Department (HOSPITAL_COMMUNITY)
Admission: EM | Admit: 2017-08-14 | Discharge: 2017-08-14 | Discharge status: Against Medical Advice | Payer: Medicare Other | Attending: Emergency Medicine | Admitting: Emergency Medicine

## 2017-08-14 ENCOUNTER — Other Ambulatory Visit: Payer: Self-pay

## 2017-08-14 ENCOUNTER — Encounter (HOSPITAL_COMMUNITY): Payer: Self-pay | Admitting: Emergency Medicine

## 2017-08-14 DIAGNOSIS — R531 Weakness: Secondary | ICD-10-CM | POA: Diagnosis not present

## 2017-08-14 DIAGNOSIS — Z5321 Procedure and treatment not carried out due to patient leaving prior to being seen by health care provider: Secondary | ICD-10-CM | POA: Diagnosis not present

## 2017-08-14 DIAGNOSIS — J029 Acute pharyngitis, unspecified: Secondary | ICD-10-CM | POA: Insufficient documentation

## 2017-08-14 DIAGNOSIS — B349 Viral infection, unspecified: Secondary | ICD-10-CM | POA: Diagnosis not present

## 2017-08-14 DIAGNOSIS — R197 Diarrhea, unspecified: Secondary | ICD-10-CM | POA: Diagnosis not present

## 2017-08-14 NOTE — ED Notes (Signed)
Per registration, the pt and family left.

## 2017-08-14 NOTE — ED Triage Notes (Signed)
Patient reports sore throat, dry cough since Monday. Patient also states she thinks she is dehydrated.

## 2017-09-11 IMAGING — CR DG CHEST 2V
2 series · 2 of 2 positions shown · non-contrast
Comparison: PA and lateral chest x-ray April 25, 2013.

CLINICAL DATA: Shortness of breath, history of atrial fibrillation,
mitral regurgitation, CHF, breast malignancy.

EXAM:
CHEST  2 VIEW

[view not recorded (1 of 2)]
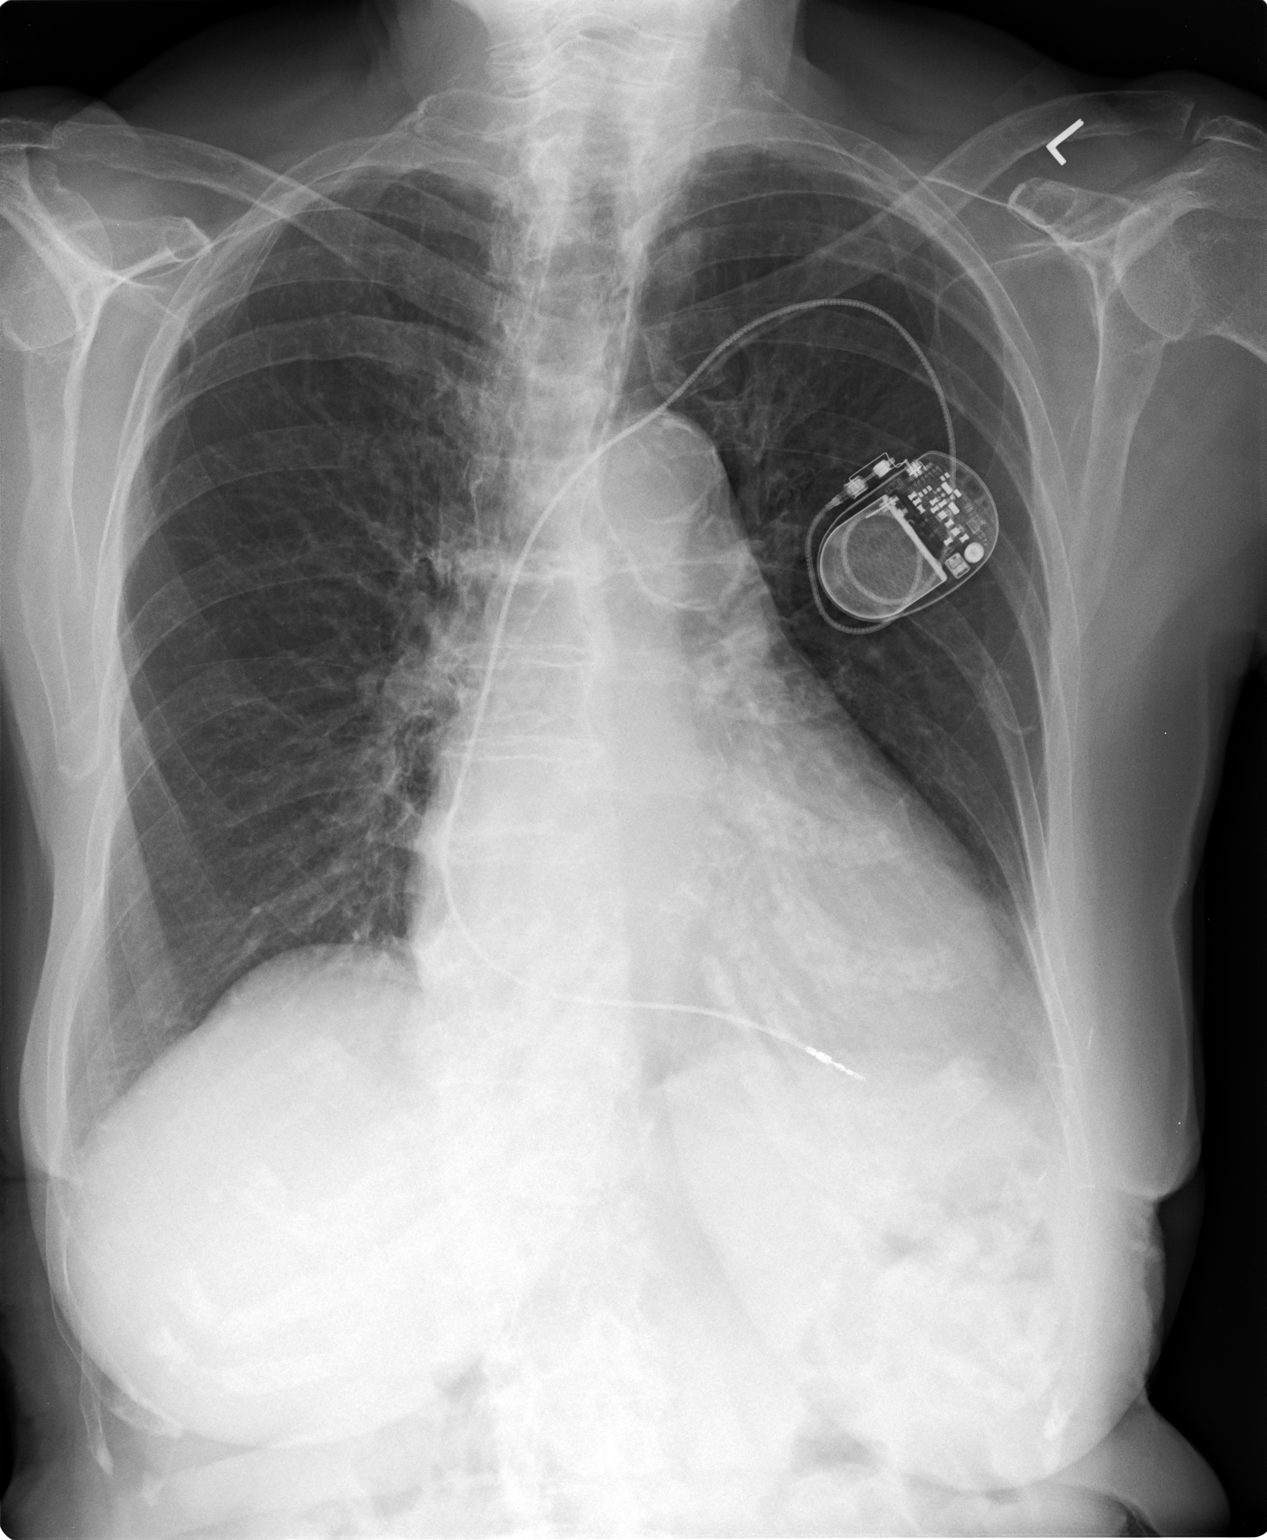

[view not recorded (2 of 2)]
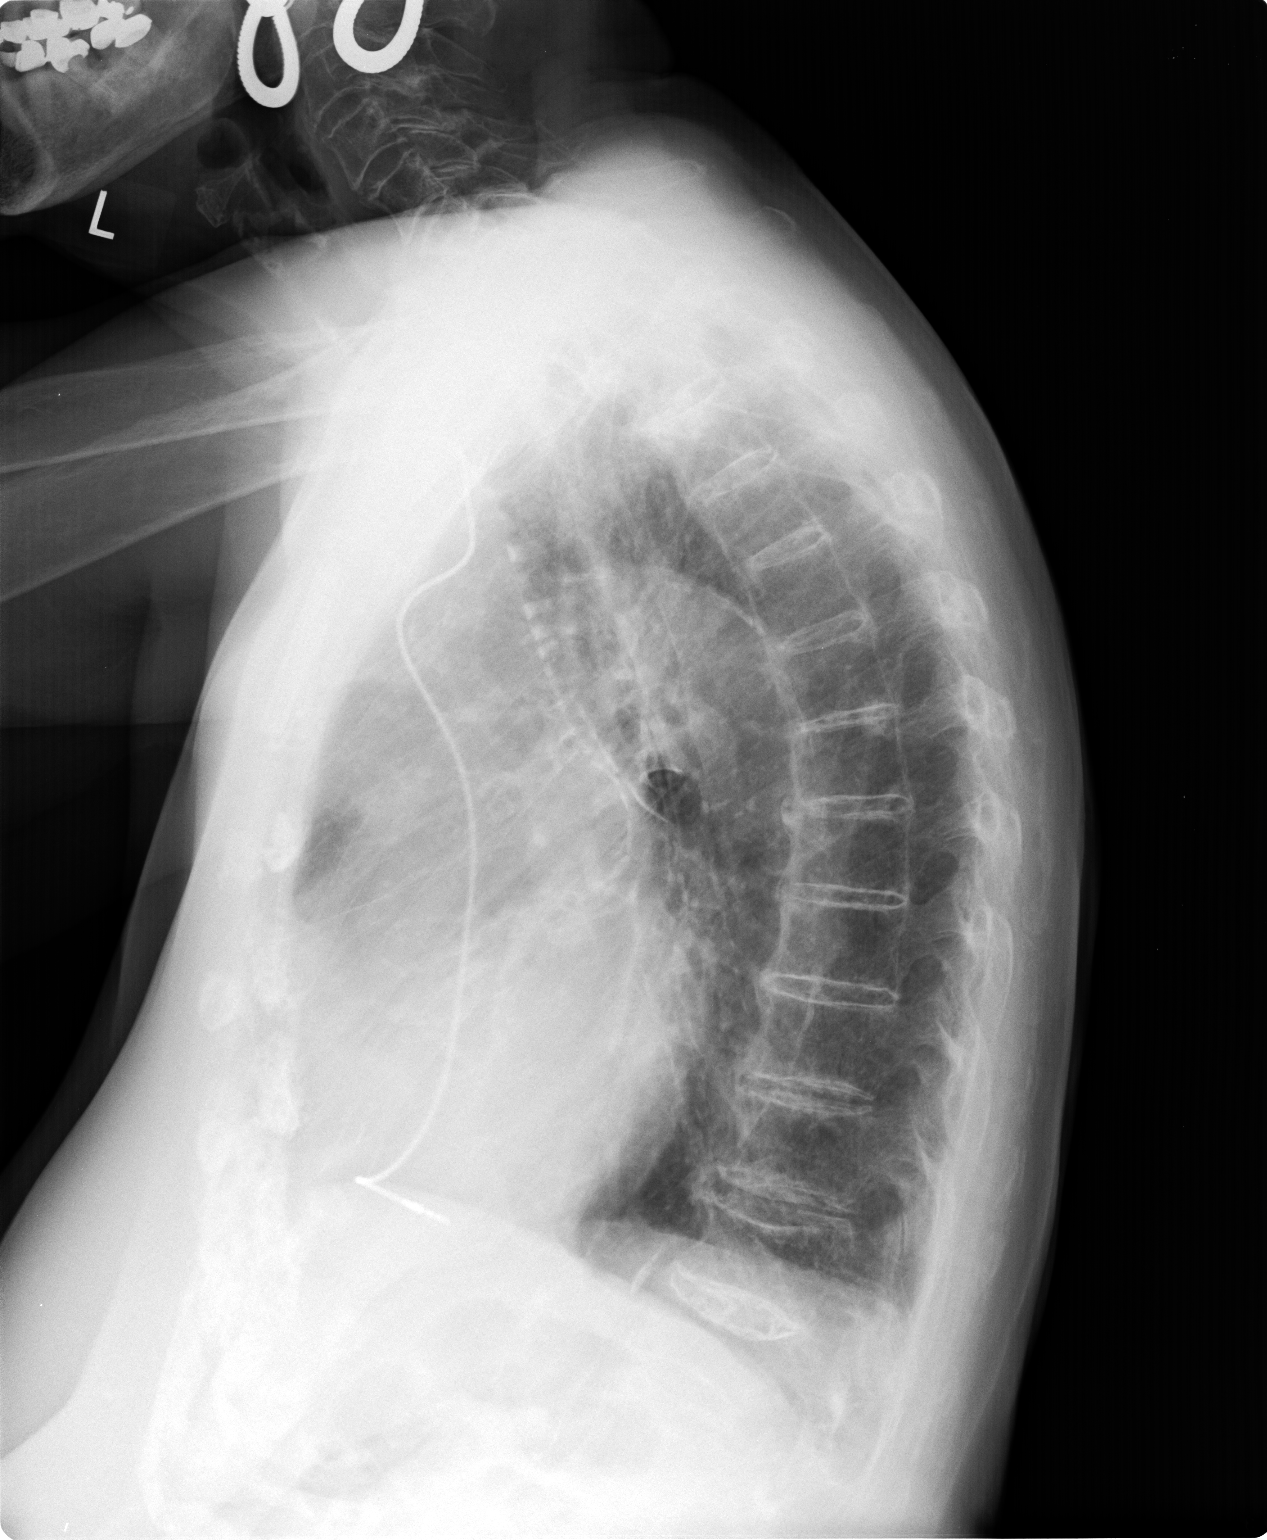

[2 of 2 positions shown; findings below may reference images not displayed]

FINDINGS: The lungs are hyperinflated. There is no focal infiltrate. There is
no pleural effusion or pneumothorax. The cardiac silhouette is
mildly enlarged but stable. The pulmonary vascularity is not
engorged. There is tortuosity of the descending thoracic aorta. The
permanent pacemaker is in stable position. There is mild loss of
height of the bodies of T12 and L1 which has appeared since
Thursday March, 2013.
IMPRESSION: COPD with mild cardiomegaly but no evidence of pulmonary edema or
pneumonia.

## 2017-09-18 ENCOUNTER — Encounter: Payer: Self-pay | Admitting: Pharmacist Clinician (PhC)/ Clinical Pharmacy Specialist

## 2017-10-07 ENCOUNTER — Encounter: Payer: Self-pay | Admitting: Family Medicine

## 2017-10-07 ENCOUNTER — Ambulatory Visit (INDEPENDENT_AMBULATORY_CARE_PROVIDER_SITE_OTHER): Payer: Medicare Other | Admitting: Family Medicine

## 2017-10-07 VITALS — BP 143/78 | HR 70 | Temp 97.4°F | Ht 66.0 in | Wt 145.0 lb

## 2017-10-07 DIAGNOSIS — R29898 Other symptoms and signs involving the musculoskeletal system: Secondary | ICD-10-CM

## 2017-10-07 DIAGNOSIS — F419 Anxiety disorder, unspecified: Secondary | ICD-10-CM | POA: Diagnosis not present

## 2017-10-07 DIAGNOSIS — I482 Chronic atrial fibrillation: Secondary | ICD-10-CM | POA: Diagnosis not present

## 2017-10-07 DIAGNOSIS — E7800 Pure hypercholesterolemia, unspecified: Secondary | ICD-10-CM

## 2017-10-07 DIAGNOSIS — E559 Vitamin D deficiency, unspecified: Secondary | ICD-10-CM

## 2017-10-07 DIAGNOSIS — I10 Essential (primary) hypertension: Secondary | ICD-10-CM

## 2017-10-07 DIAGNOSIS — D509 Iron deficiency anemia, unspecified: Secondary | ICD-10-CM | POA: Diagnosis not present

## 2017-10-07 DIAGNOSIS — Z7901 Long term (current) use of anticoagulants: Secondary | ICD-10-CM | POA: Diagnosis not present

## 2017-10-07 DIAGNOSIS — I4821 Permanent atrial fibrillation: Secondary | ICD-10-CM

## 2017-10-07 DIAGNOSIS — R7989 Other specified abnormal findings of blood chemistry: Secondary | ICD-10-CM | POA: Diagnosis not present

## 2017-10-07 LAB — COAGUCHEK XS/INR WAIVED
INR: 2.4 — ABNORMAL HIGH (ref 0.9–1.1)
PROTHROMBIN TIME: 28.5 s

## 2017-10-07 MED ORDER — BUSPIRONE HCL 5 MG PO TABS: 5.0000 mg | ORAL_TABLET | Freq: Every day | ORAL | 0 refills | Status: DC | PRN

## 2017-10-07 NOTE — Patient Instructions (Addendum)
Medicare Annual Wellness Visit  [facility] and the medical providers at [facility] strive to bring you the best medical care.  In doing so we not only want to address your current medical conditions and concerns but also to detect new conditions early and prevent illness, disease and health-related problems.    Medicare offers a yearly Wellness Visit which allows our clinical staff to assess your need for preventative services including immunizations, lifestyle education, counseling to decrease risk of preventable diseases and screening for fall risk and other medical concerns.    This visit is provided free of charge (no copay) for all Medicare recipients. The clinical pharmacists at [facility] have begun to conduct these Wellness Visits which will also include a thorough review of all your medications.    As you primary medical provider recommend that you make an appointment for your Annual Wellness Visit if you have not done so already this year.  You may set up this appointment before you leave today or you may call back ([phone]) and schedule an appointment.  Please make sure when you call that you mention that you are scheduling your Annual Wellness Visit with the clinical pharmacist so that the appointment may be made for the proper length of time.     Continue current medications. Continue good therapeutic lifestyle changes which include good diet and exercise. Fall precautions discussed with patient. If an FOBT was given today- please return it to our front desk. If you are over 71 years old - you may need Prevnar 72 or the adult Pneumonia vaccine.  **Flu shots are available--- please call and schedule a FLU-CLINIC appointment**  After your visit with [place] today you will receive a survey in the mail or online from [employer] regarding your care with [place]. Please take a moment to fill this out. Your feedback is very  important to [place] as you can help [place] better understand your patient needs as well as improve your experience and satisfaction. WE CARE ABOUT YOU!!!   Repeat pro time in 4 weeks Try to drink plenty of fluids and stay well-hydrated Continue to take medication as directed Follow-up with cardiology as planned

## 2017-10-07 NOTE — Progress Notes (Signed)
Subjective:    Patient ID: [name], female    DOB: 1916-11-04, 84 y.o.   MRN: [id number]  HPI Pt here for follow up and management of chronic medical problems which includes hypertension, hyperlipidemia and a fib. She is taking medication regularly.  Is doing well overall other than her ongoing weakness and hurting in her legs.  She has been to the emergency room a couple of times one time she has had some diarrhea and did not stay at the emergency room when she went in another time she went and everything was stable other than just some dehydration.  She is 84 and requesting some Valium.  I feel reluctant to give her that with a history of weakness.  Her INR today was 2.4 and she has not had a pro time since early January.  She will be encouraged to come back more frequently for the INR and will be told to continue her current dose.  Patient comes with her sitter to the visit today.  She appears alert a little bit pale and is in a wheelchair.  She denies any chest pain or shortness of breath.  She denies any trouble with her intestinal tract including nausea vomiting diarrhea or blood in the stool.  She is passing her water without problems.  She is requesting Valium to take for anxiety and does not take it very often.  We are going to check with the drugstore to see if she has had this in the past.     Patient Active Problem List   Diagnosis Date Noted  . Iron deficiency anemia 06/01/2017  . Permanent atrial fibrillation ([place]) 03/17/2017  . Abdominal aortic atherosclerosis ([place]) 05/29/2016  . Ventral hernia 12/08/2013  . Vitamin D deficiency 12/08/2013  . Breast cancer ([place]) 12/08/2013  . Long term current use of anticoagulant therapy 05/05/2013  . Shortness of breath 05/02/2013  . Restless leg syndrome 10/12/2010  . CAROTID BRUIT 08/21/2010  . MITRAL REGURGITATION 08/01/2009  . HYPERTENSION, BENIGN 08/17/2008  . AV BLOCK, COMPLETE 08/17/2008  . ATRIAL FIBRILLATION 08/17/2008    . PACEMAKER, PERMANENT 08/17/2008   Outpatient Encounter Medications as of 10/07/2017  Medication Sig  . b complex vitamins tablet Take 1 tablet by mouth daily.   . Cholecalciferol (VITAMIN D3) 1000 UNITS tablet Take 2,000 Units by mouth daily.    . Fe Fum-FePoly-Vit C-Vit B3 (INTEGRA) 62.5-62.5-40-3 MG CAPS TAKE ONE CAPSULE BY MOUTH ONCE DAILY.  . fish oil-omega-3 fatty acids 1000 MG capsule Take 2 g by mouth daily.  . furosemide (LASIX) 20 MG tablet Take 1 tablet (20 mg total) by mouth daily as needed.  . lactose free nutrition (BOOST) LIQD Take 237 mLs by mouth daily.  . polyethylene glycol powder (GLYCOLAX/MIRALAX) powder Take 17 g by mouth 2 (two) times daily.  . vitamin C (ASCORBIC ACID) 500 MG tablet Take 500 mg by mouth daily.    . vitamin E 400 UNIT capsule Take 400 Units by mouth daily.  Marland Kitchen warfarin (COUMADIN) 5 MG tablet TAKE 1/2 OR 1 TABLET BY MOUTH ONCE A DAY OR AS INSTRUCTED BY COUMADIN ANTICOAGULATION CLINIC   No facility-administered encounter medications on file as of 10/07/2017.       Review of Systems  Constitutional: Negative.   HENT: Negative.   Eyes: Negative.   Respiratory: Negative.   Cardiovascular: Negative.   Gastrointestinal: Negative.   Endocrine: Negative.   Genitourinary: Negative.   Musculoskeletal: Positive for arthralgias. Joint swelling: bilateral leg pain  and weakness.  Skin: Negative.   Allergic/Immunologic: Negative.   Neurological: Negative.   Hematological: Negative.   Psychiatric/Behavioral: Negative.        Objective:   Physical Exam  Constitutional: She is oriented to person, place, and time. No distress.  Patient is elderly but alert  HENT:  Head: Normocephalic and atraumatic.  Right Ear: External ear normal.  Left Ear: External ear normal.  Nose: Nose normal.  Mouth/Throat: Oropharynx is clear and moist. No oropharyngeal exudate.  Oral cavity appears well-hydrated  Eyes: Conjunctivae and EOM are normal. Pupils are equal,  round, and reactive to light. Right eye exhibits no discharge. Left eye exhibits no discharge. No scleral icterus.  Neck: Normal range of motion. Neck supple. No thyromegaly present.  Murmur radiated to both carotids.  No adenopathy.  Cardiovascular: Normal rate, regular rhythm and normal heart sounds.  No murmur heard. Heart is regular today at 72/min with a grade 4/6 systolic ejection murmur  Pulmonary/Chest: Effort normal and breath sounds normal. No respiratory distress. She has no wheezes. She has no rales.  Clear anteriorly and posteriorly  Abdominal: Soft. Bowel sounds are normal. She exhibits no mass. There is no tenderness. There is no rebound and no guarding.  Umbilical hernia present no liver or spleen enlargement no suprapubic tenderness  Musculoskeletal: She exhibits no edema.  Patient in wheelchair and uses walker at home fairly good strength in both upper and lower extremities  Lymphadenopathy:    She has no cervical adenopathy.  Neurological: She is alert and oriented to person, place, and time. She has normal reflexes. No cranial nerve deficit.  Skin: Skin is warm and dry. No rash noted. There is pallor.  Psychiatric: She has a normal mood and affect. Her behavior is normal. Judgment and thought content normal.  Nursing note and vitals reviewed.  BP (!) 143/78 (BP Location: Right Arm)   Pulse 70   Temp (!) 97.4 F (36.3 C) (Oral)   Ht 5' 6"  (1.676 m)   Wt 145 lb (65.8 kg)   SpO2 100%   BMI 23.40 kg/m         Assessment & Plan:  1. Permanent atrial fibrillation (HCC) -The patient was in normal sinus rhythm today with a grade 4/6 systolic ejection murmur -Continue Coumadin as currently doing 1 pill daily except a half of one on Monday - CBC with Differential/Platelet - CoaguChek XS/INR Waived  2. HYPERTENSION, BENIGN -Blood pressure is good today for this patient at 143/78 no change in treatment. - CBC with Differential/Platelet - BMP8+EGFR - Hepatic  function panel  3. Pure hypercholesterolemia -Continue therapeutic lifestyle changes as much as possible - CBC with Differential/Platelet - Lipid panel  4. Vitamin D deficiency -Continue vitamin D replacement pending results of lab work - CBC with Differential/Platelet - VITAMIN D 25 Hydroxy (Vit-D Deficiency, Fractures)  5. Elevated TSH -Continue current thyroid replacement pending results of lab work - CBC with Differential/Platelet - Thyroid Panel With TSH  6. Iron deficiency anemia, unspecified iron deficiency anemia type -Continue iron pill pending results of lab work - CBC with Differential/Platelet  7. Weakness of both lower extremities -Check electrolytes walk with walker with assistance if needed  8. Anxiety -BuSpar 5 mg 1 daily if needed #15 no refills    Meds ordered this encounter  Medications  . busPIRone (BUSPAR) 5 MG tablet    Sig: Take 1 tablet (5 mg total) by mouth daily as needed.    Dispense:  15 tablet  Refill:  0     Patient Instructions                       Medicare Annual Wellness Visit  [facility] and the medical providers at [facility] strive to bring you the best medical care.  In doing so we not only want to address your current medical conditions and concerns but also to detect new conditions early and prevent illness, disease and health-related problems.    Medicare offers a yearly Wellness Visit which allows our clinical staff to assess your need for preventative services including immunizations, lifestyle education, counseling to decrease risk of preventable diseases and screening for fall risk and other medical concerns.    This visit is provided free of charge (no copay) for all Medicare recipients. The clinical pharmacists at [facility] have begun to conduct these Wellness Visits which will also include a thorough review of all your medications.    As you primary medical provider  recommend that you make an appointment for your Annual Wellness Visit if you have not done so already this year.  You may set up this appointment before you leave today or you may call back ([phone]) and schedule an appointment.  Please make sure when you call that you mention that you are scheduling your Annual Wellness Visit with the clinical pharmacist so that the appointment may be made for the proper length of time.     Continue current medications. Continue good therapeutic lifestyle changes which include good diet and exercise. Fall precautions discussed with patient. If an FOBT was given today- please return it to our front desk. If you are over 31 years old - you may need Prevnar 55 or the adult Pneumonia vaccine.  **Flu shots are available--- please call and schedule a FLU-CLINIC appointment**  After your visit with [place] today you will receive a survey in the mail or online from [employer] regarding your care with [place]. Please take a moment to fill this out. Your feedback is very important to [place] as you can help [place] better understand your patient needs as well as improve your experience and satisfaction. WE CARE ABOUT YOU!!!   Repeat pro time in 4 weeks Try to drink plenty of fluids and stay well-hydrated Continue to take medication as directed Follow-up with cardiology as planned  Arrie Senate MD

## 2017-10-08 ENCOUNTER — Other Ambulatory Visit: Payer: Medicare Other

## 2017-10-08 DIAGNOSIS — Z1212 Encounter for screening for malignant neoplasm of rectum: Secondary | ICD-10-CM | POA: Diagnosis not present

## 2017-10-08 LAB — BMP8+EGFR
BUN/Creatinine Ratio: 22 (ref 12–28)
BUN: 19 mg/dL (ref 10–36)
CALCIUM: 9.8 mg/dL (ref 8.7–10.3)
CO2: 26 mmol/L (ref 20–29)
Chloride: 95 mmol/L — ABNORMAL LOW (ref 96–106)
Creatinine, Ser: 0.85 mg/dL (ref 0.57–1.00)
GFR, EST AFRICAN AMERICAN: 66 mL/min/{1.73_m2} (ref >59)
GFR, EST NON AFRICAN AMERICAN: 57 mL/min/{1.73_m2} — AB (ref >59)
Glucose: 95 mg/dL (ref 65–99)
POTASSIUM: 4.4 mmol/L (ref 3.5–5.2)
SODIUM: 137 mmol/L (ref 134–144)

## 2017-10-08 LAB — CBC WITH DIFFERENTIAL/PLATELET
BASOS: 1 %
Basophils Absolute: 0 10*3/uL (ref 0.0–0.2)
EOS (ABSOLUTE): 0.1 10*3/uL (ref 0.0–0.4)
EOS: 2 %
HEMATOCRIT: 34.9 % (ref 34.0–46.6)
Hemoglobin: 11.6 g/dL (ref 11.1–15.9)
Immature Grans (Abs): 0 10*3/uL (ref 0.0–0.1)
Immature Granulocytes: 0 %
LYMPHS ABS: 1.6 10*3/uL (ref 0.7–3.1)
Lymphs: 34 %
MCH: 29.6 pg (ref 26.6–33.0)
MCHC: 33.2 g/dL (ref 31.5–35.7)
MCV: 89 fL (ref 79–97)
MONOS ABS: 0.6 10*3/uL (ref 0.1–0.9)
Monocytes: 13 %
Neutrophils Absolute: 2.4 10*3/uL (ref 1.4–7.0)
Neutrophils: 50 %
Platelets: 218 10*3/uL (ref 150–379)
RBC: 3.92 x10E6/uL (ref 3.77–5.28)
RDW: 14.5 % (ref 12.3–15.4)
WBC: 4.7 10*3/uL (ref 3.4–10.8)

## 2017-10-08 LAB — HEPATIC FUNCTION PANEL
ALK PHOS: 51 IU/L (ref 39–117)
ALT: 12 IU/L (ref 0–32)
AST: 18 IU/L (ref 0–40)
Albumin: 4.6 g/dL (ref 3.2–4.6)
BILIRUBIN TOTAL: 0.8 mg/dL (ref 0.0–1.2)
Bilirubin, Direct: 0.25 mg/dL (ref 0.00–0.40)
Total Protein: 7.3 g/dL (ref 6.0–8.5)

## 2017-10-08 LAB — LIPID PANEL
CHOL/HDL RATIO: 3.8 ratio (ref 0.0–4.4)
Cholesterol, Total: 232 mg/dL — ABNORMAL HIGH (ref 100–199)
HDL: 61 mg/dL (ref >39)
LDL CALC: 155 mg/dL — AB (ref 0–99)
Triglycerides: 81 mg/dL (ref 0–149)
VLDL Cholesterol Cal: 16 mg/dL (ref 5–40)

## 2017-10-08 LAB — THYROID PANEL WITH TSH
FREE THYROXINE INDEX: 1.3 (ref 1.2–4.9)
T3 Uptake Ratio: 27 % (ref 24–39)
T4, Total: 4.8 ug/dL (ref 4.5–12.0)
TSH: 4.63 u[IU]/mL — ABNORMAL HIGH (ref 0.450–4.500)

## 2017-10-08 LAB — VITAMIN D 25 HYDROXY (VIT D DEFICIENCY, FRACTURES): Vit D, 25-Hydroxy: 48.6 ng/mL (ref 30.0–100.0)

## 2017-10-12 LAB — FECAL OCCULT BLOOD, IMMUNOCHEMICAL: Fecal Occult Bld: POSITIVE — AB

## 2017-10-28 ENCOUNTER — Telehealth: Payer: Self-pay | Admitting: Family Medicine

## 2017-10-28 ENCOUNTER — Other Ambulatory Visit: Payer: Self-pay | Admitting: *Deleted

## 2017-10-28 DIAGNOSIS — R195 Other fecal abnormalities: Secondary | ICD-10-CM

## 2017-10-28 NOTE — Telephone Encounter (Signed)
Aware. Can have CBC, protime and get stool card at lab visit.

## 2017-11-02 ENCOUNTER — Other Ambulatory Visit: Payer: Medicare Other

## 2017-11-04 ENCOUNTER — Ambulatory Visit (INDEPENDENT_AMBULATORY_CARE_PROVIDER_SITE_OTHER): Payer: Medicare Other | Admitting: *Deleted

## 2017-11-04 ENCOUNTER — Telehealth: Payer: Self-pay | Admitting: Cardiology

## 2017-11-04 DIAGNOSIS — I442 Atrioventricular block, complete: Secondary | ICD-10-CM

## 2017-11-04 NOTE — Telephone Encounter (Signed)
Confirmed remote transmission w/ pt granddaughter.   

## 2017-11-05 ENCOUNTER — Encounter: Payer: Self-pay | Admitting: Cardiology

## 2017-11-05 NOTE — Progress Notes (Signed)
Remote pacemaker transmission.   

## 2017-11-06 ENCOUNTER — Ambulatory Visit: Payer: Medicare Other | Admitting: Pharmacist Clinician (PhC)/ Clinical Pharmacy Specialist

## 2017-11-06 ENCOUNTER — Other Ambulatory Visit: Payer: Medicare Other

## 2017-11-06 ENCOUNTER — Ambulatory Visit (INDEPENDENT_AMBULATORY_CARE_PROVIDER_SITE_OTHER): Payer: Medicare Other | Admitting: Family Medicine

## 2017-11-06 ENCOUNTER — Encounter: Payer: Self-pay | Admitting: Family Medicine

## 2017-11-06 VITALS — BP 121/63 | HR 69 | Temp 99.2°F | Ht 66.0 in | Wt 145.0 lb

## 2017-11-06 DIAGNOSIS — I4821 Permanent atrial fibrillation: Secondary | ICD-10-CM

## 2017-11-06 DIAGNOSIS — K625 Hemorrhage of anus and rectum: Secondary | ICD-10-CM | POA: Diagnosis not present

## 2017-11-06 DIAGNOSIS — I482 Chronic atrial fibrillation: Secondary | ICD-10-CM | POA: Diagnosis not present

## 2017-11-06 DIAGNOSIS — Z7901 Long term (current) use of anticoagulants: Secondary | ICD-10-CM

## 2017-11-06 DIAGNOSIS — Z1211 Encounter for screening for malignant neoplasm of colon: Secondary | ICD-10-CM

## 2017-11-06 DIAGNOSIS — R531 Weakness: Secondary | ICD-10-CM | POA: Diagnosis not present

## 2017-11-06 LAB — URINALYSIS, COMPLETE
Bilirubin, UA: NEGATIVE
Glucose, UA: NEGATIVE
Ketones, UA: NEGATIVE
Leukocytes, UA: NEGATIVE
NITRITE UA: NEGATIVE
PH UA: 6 (ref 5.0–7.5)
PROTEIN UA: NEGATIVE
RBC UA: NEGATIVE
Specific Gravity, UA: 1.015 (ref 1.005–1.030)
UUROB: 0.2 mg/dL (ref 0.2–1.0)

## 2017-11-06 LAB — MICROSCOPIC EXAMINATION
Bacteria, UA: NONE SEEN
Renal Epithel, UA: NONE SEEN /hpf
WBC UA: NONE SEEN /HPF (ref 0–5)

## 2017-11-06 LAB — COAGUCHEK XS/INR WAIVED
INR: 1.6 — AB (ref 0.9–1.1)
PROTHROMBIN TIME: 19.2 s

## 2017-11-06 LAB — HEMOGLOBIN, FINGERSTICK: Hemoglobin: 10.5 g/dL — ABNORMAL LOW (ref 11.1–15.9)

## 2017-11-06 NOTE — Progress Notes (Signed)
BP 121/63   Pulse 69   Temp 99.2 F (37.3 C) (Oral)   Ht 5' 6"  (1.676 m)   Wt 145 lb (65.8 kg)   BMI 23.40 kg/m    Subjective:    Patient ID: [name], female    DOB: 10-03-1916, 84 y.o.   MRN: [id number]  HPI: [name] is a 84 y.o. female presenting on 11/06/2017 for Fall one week ago, caregiver has noticed weakness for the la (hemoglobin level low, is taking iron; had positive FOBT 10/08/17, brought another today and had CBC drawn)   HPI Weakness and rectal bleeding and fall Patient is coming in today with weakness and rectal bleeding and fall.  She has not noticed any visible rectal bleeding but had a Hemoccult that was positive with her PCP recently.  She is taking iron, the FOBT was positive on 10/08/2017.  She also had an INR today that was 1.6.  She denies any pain anywhere or any urinary symptoms.  She denies any fevers or chills.  She is here with a caretaker today and the caretaker said she fell once last week and is just sore but did not fall all the way down but landed on something soft about halfway down.  She has been noticing that she is progressively weak and since she has been her up with her which is over the past few weeks.  Relevant past medical, surgical, family and social history reviewed and updated as indicated. Interim medical history since our last visit reviewed. Allergies and medications reviewed and updated.  Review of Systems  Constitutional: Positive for fatigue. Negative for chills and fever.  Eyes: Negative for visual disturbance.  Respiratory: Negative for chest tightness and shortness of breath.   Cardiovascular: Negative for chest pain and leg swelling.  Gastrointestinal: Negative for abdominal pain, constipation, diarrhea, nausea and vomiting.  Genitourinary: Negative for difficulty urinating and dysuria.  Musculoskeletal: Negative for back pain and gait problem.  Skin: Negative for rash.  Neurological: Positive for weakness.  Negative for dizziness, light-headedness, numbness and headaches.  Psychiatric/Behavioral: Negative for agitation and behavioral problems.  All other systems reviewed and are negative.   Per HPI unless specifically indicated above   Allergies as of 11/06/2017      Reactions   Atorvastatin    unknown   Boniva [ibandronate Sodium]    unknown   Chocolate    unknown   Lovastatin    unknown   Pravastatin Sodium    unknown   Risedronate Sodium    unknown   Toprol Xl [metoprolol Succinate]    unknown   Visken [pindolol]    unknown   Welchol [colesevelam Hcl]    unknown   Vesicare [solifenacin] Anxiety, Other (See Comments)   lightheaded      Medication List        Accurate as of 11/06/17  3:02 PM. Always use your most recent med list.          b complex vitamins tablet Take 1 tablet by mouth daily.   busPIRone 5 MG tablet Commonly known as:  BUSPAR Take 1 tablet (5 mg total) by mouth daily as needed.   cholecalciferol 1000 units tablet Commonly known as:  VITAMIN D Take 2,000 Units by mouth daily.   fish oil-omega-3 fatty acids 1000 MG capsule Take 2 g by mouth daily.   furosemide 20 MG tablet Commonly known as:  LASIX Take 1 tablet (20 mg total) by mouth daily as needed.  INTEGRA 62.5-62.5-40-3 MG Caps TAKE ONE CAPSULE BY MOUTH ONCE DAILY.   lactose free nutrition Liqd Take 237 mLs by mouth daily.   polyethylene glycol powder powder Commonly known as:  GLYCOLAX/MIRALAX Take 17 g by mouth 2 (two) times daily.   vitamin C 500 MG tablet Commonly known as:  ASCORBIC ACID Take 500 mg by mouth daily.   vitamin E 400 UNIT capsule Take 400 Units by mouth daily.   warfarin 5 MG tablet Commonly known as:  COUMADIN Take as directed by the anticoagulation clinic. If you are unsure how to take this medication, talk to your nurse or doctor. Original instructions:  TAKE 1/2 OR 1 TABLET BY MOUTH ONCE A DAY OR AS INSTRUCTED BY COUMADIN ANTICOAGULATION CLINIC           Objective:    BP 121/63   Pulse 69   Temp 99.2 F (37.3 C) (Oral)   Ht 5' 6"  (1.676 m)   Wt 145 lb (65.8 kg)   BMI 23.40 kg/m   Wt Readings from Last 3 Encounters:  11/06/17 145 lb (65.8 kg)  10/07/17 145 lb (65.8 kg)  08/14/17 145 lb (65.8 kg)    Physical Exam  Constitutional: She is oriented to person, place, and time. She appears well-developed and well-nourished. No distress.  Eyes: Pupils are equal, round, and reactive to light. Conjunctivae and EOM are normal.  Neck: No thyromegaly present.  Cardiovascular: Normal rate, regular rhythm, normal heart sounds and intact distal pulses.  No murmur heard. Pulmonary/Chest: Effort normal and breath sounds normal. No respiratory distress. She has no wheezes.  Abdominal: Soft. Bowel sounds are normal. She exhibits no distension. There is no tenderness. There is no rebound and no guarding.  Musculoskeletal: Normal range of motion. She exhibits no edema or tenderness (She denies any specific pain or tenderness on exam).  Lymphadenopathy:    She has no cervical adenopathy.  Neurological: She is alert and oriented to person, place, and time. Coordination normal.  Skin: Skin is warm and dry. No rash noted. She is not diaphoretic.  Psychiatric: She has a normal mood and affect. Her behavior is normal.  Nursing note and vitals reviewed.  Urinalysis: 0-2 RBCs, 0-10 epithelial cells, otherwise negative  Hemoglobin is 11.2  INR was 1.6 earlier today    Assessment & Plan:   Problem List Items Addressed This Visit    None    Visit Diagnoses    Rectal bleeding    -  Primary   Relevant Orders   Hemoglobin, fingerstick   CBC with Differential/Platelet   CMP14+EGFR   Fecal occult blood, imunochemical   Weakness       Relevant Orders   Hemoglobin, fingerstick   CBC with Differential/Platelet   CMP14+EGFR   Fecal occult blood, imunochemical       Follow up plan: Return if symptoms worsen or fail to improve.  Counseling  provided for all of the vaccine components Orders Placed This Encounter  Procedures  . Fecal occult blood, imunochemical  . Hemoglobin, fingerstick  . CBC with Differential/Platelet  . [place] Dettinger, MD [facility] 11/06/2017, 3:02 PM

## 2017-11-06 NOTE — Patient Instructions (Signed)
Description   Take 1 1/2 tablets today and tomorrow then continue taking 2.5mg  on Mon and 5mg  all other days  INR today is 1.6 Goal is 2.0-3.0

## 2017-11-07 LAB — CBC WITH DIFFERENTIAL/PLATELET
BASOS: 0 %
Basophils Absolute: 0 10*3/uL (ref 0.0–0.2)
EOS (ABSOLUTE): 0.1 10*3/uL (ref 0.0–0.4)
EOS: 2 %
HEMATOCRIT: 31.4 % — AB (ref 34.0–46.6)
Hemoglobin: 10.4 g/dL — ABNORMAL LOW (ref 11.1–15.9)
Immature Grans (Abs): 0 10*3/uL (ref 0.0–0.1)
Immature Granulocytes: 0 %
LYMPHS ABS: 1.3 10*3/uL (ref 0.7–3.1)
Lymphs: 25 %
MCH: 30.1 pg (ref 26.6–33.0)
MCHC: 33.1 g/dL (ref 31.5–35.7)
MCV: 91 fL (ref 79–97)
MONOS ABS: 0.6 10*3/uL (ref 0.1–0.9)
Monocytes: 12 %
Neutrophils Absolute: 3.3 10*3/uL (ref 1.4–7.0)
Neutrophils: 61 %
Platelets: 205 10*3/uL (ref 150–379)
RBC: 3.45 x10E6/uL — AB (ref 3.77–5.28)
RDW: 14.8 % (ref 12.3–15.4)
WBC: 5.3 10*3/uL (ref 3.4–10.8)

## 2017-11-07 LAB — CMP14+EGFR
A/G RATIO: 1.6 (ref 1.2–2.2)
ALK PHOS: 58 IU/L (ref 39–117)
ALT: 22 IU/L (ref 0–32)
AST: 23 IU/L (ref 0–40)
Albumin: 4.2 g/dL (ref 3.2–4.6)
BUN/Creatinine Ratio: 27 (ref 12–28)
BUN: 24 mg/dL (ref 10–36)
Bilirubin Total: 0.8 mg/dL (ref 0.0–1.2)
CHLORIDE: 96 mmol/L (ref 96–106)
CO2: 24 mmol/L (ref 20–29)
Calcium: 9.8 mg/dL (ref 8.7–10.3)
Creatinine, Ser: 0.9 mg/dL (ref 0.57–1.00)
GFR calc Af Amer: 62 mL/min/{1.73_m2} (ref >59)
GFR calc non Af Amer: 53 mL/min/{1.73_m2} — ABNORMAL LOW (ref >59)
GLOBULIN, TOTAL: 2.7 g/dL (ref 1.5–4.5)
Glucose: 128 mg/dL — ABNORMAL HIGH (ref 65–99)
POTASSIUM: 4.3 mmol/L (ref 3.5–5.2)
SODIUM: 135 mmol/L (ref 134–144)
Total Protein: 6.9 g/dL (ref 6.0–8.5)

## 2017-11-07 LAB — FECAL OCCULT BLOOD, IMMUNOCHEMICAL: FECAL OCCULT BLD: POSITIVE — AB

## 2017-11-10 ENCOUNTER — Encounter: Payer: Self-pay | Admitting: *Deleted

## 2017-11-11 ENCOUNTER — Encounter: Payer: Self-pay | Admitting: Family Medicine

## 2017-11-12 ENCOUNTER — Encounter: Payer: Self-pay | Admitting: Family Medicine

## 2017-11-12 ENCOUNTER — Ambulatory Visit (INDEPENDENT_AMBULATORY_CARE_PROVIDER_SITE_OTHER): Payer: Medicare Other | Admitting: Family Medicine

## 2017-11-12 VITALS — BP 133/78 | HR 70 | Temp 99.1°F

## 2017-11-12 DIAGNOSIS — R531 Weakness: Secondary | ICD-10-CM | POA: Diagnosis not present

## 2017-11-12 NOTE — Progress Notes (Signed)
BP 133/78   Pulse 70   Temp 99.1 F (37.3 C) (Oral)    Subjective:    Patient ID: [name], female    DOB: [birth year], 84 y.o.   MRN: [id number]  HPI: [name] is a 84 y.o. female presenting on 11/12/2017 for Follow up weakness   HPI Generalized weakness Patient is coming in for follow-up on generalized weakness.  She denies having any visualized blood loss and just generally still feels weak and tired.  Her new caregiver says she does not know if this way she is been for a while but does say that she complains about a lot recently.  We will refer her back to her PCP for evaluation.  She denies any specific weakness but just generally feels weak and tired and with less energy.  She says she is sleeping fine and naps throughout the day per her care provider.  Her blood counts were stable on last exam.  Relevant past medical, surgical, family and social history reviewed and updated as indicated. Interim medical history since our last visit reviewed. Allergies and medications reviewed and updated.  Review of Systems  Constitutional: Negative for chills and fever.  HENT: Negative for congestion, ear discharge, ear pain, sinus pressure and sore throat.   Eyes: Negative for visual disturbance.  Respiratory: Negative for cough, chest tightness and shortness of breath.   Cardiovascular: Negative for chest pain and leg swelling.  Gastrointestinal: Negative for abdominal pain.  Genitourinary: Negative for difficulty urinating and dysuria.  Musculoskeletal: Negative for arthralgias, back pain and gait problem.  Skin: Negative for color change and rash.  Neurological: Negative for light-headedness and headaches.  Psychiatric/Behavioral: Negative for agitation and behavioral problems.  All other systems reviewed and are negative.   Per HPI unless specifically indicated above   Allergies as of 11/12/2017      Reactions   Atorvastatin    unknown   Boniva [ibandronate Sodium]     unknown   Chocolate    unknown   Lovastatin    unknown   Pravastatin Sodium    unknown   Risedronate Sodium    unknown   Toprol Xl [metoprolol Succinate]    unknown   Visken [pindolol]    unknown   Welchol [colesevelam Hcl]    unknown   Vesicare [solifenacin] Anxiety, Other (See Comments)   lightheaded      Medication List        Accurate as of 11/12/17  3:35 PM. Always use your most recent med list.          b complex vitamins tablet Take 1 tablet by mouth daily.   busPIRone 5 MG tablet Commonly known as:  BUSPAR Take 1 tablet (5 mg total) by mouth daily as needed.   cholecalciferol 1000 units tablet Commonly known as:  VITAMIN D Take 2,000 Units by mouth daily.   fish oil-omega-3 fatty acids 1000 MG capsule Take 2 g by mouth daily.   furosemide 20 MG tablet Commonly known as:  LASIX Take 1 tablet (20 mg total) by mouth daily as needed.   INTEGRA 62.5-62.5-40-3 MG Caps TAKE ONE CAPSULE BY MOUTH ONCE DAILY.   lactose free nutrition Liqd Take 237 mLs by mouth daily.   polyethylene glycol powder powder Commonly known as:  GLYCOLAX/MIRALAX Take 17 g by mouth 2 (two) times daily.   vitamin C 500 MG tablet Commonly known as:  ASCORBIC ACID Take 500 mg by mouth daily.   vitamin E  400 UNIT capsule Take 400 Units by mouth daily.   warfarin 5 MG tablet Commonly known as:  COUMADIN Take as directed by the anticoagulation clinic. If you are unsure how to take this medication, talk to your nurse or doctor. Original instructions:  TAKE 1/2 OR 1 TABLET BY MOUTH ONCE A DAY OR AS INSTRUCTED BY COUMADIN ANTICOAGULATION CLINIC          Objective:    BP 133/78   Pulse 70   Temp 99.1 F (37.3 C) (Oral)   Wt Readings from Last 3 Encounters:  11/06/17 145 lb (65.8 kg)  10/07/17 145 lb (65.8 kg)  08/14/17 145 lb (65.8 kg)    Physical Exam  Constitutional: She is oriented to person, place, and time. She appears well-developed and well-nourished. No  distress.  Eyes: Pupils are equal, round, and reactive to light. Conjunctivae and EOM are normal.  Neck: Normal range of motion. Neck supple. No thyromegaly present.  Cardiovascular: Normal rate, regular rhythm, normal heart sounds and intact distal pulses.  No murmur heard. Pulmonary/Chest: Effort normal and breath sounds normal. No respiratory distress. She has no wheezes.  Abdominal: Soft. Bowel sounds are normal. She exhibits no distension. There is no tenderness. There is no guarding.  Musculoskeletal: Normal range of motion. She exhibits no edema or tenderness.  Lymphadenopathy:    She has no cervical adenopathy.  Neurological: She is alert and oriented to person, place, and time. Coordination normal.  Skin: Skin is warm and dry. No rash noted. She is not diaphoretic.  Psychiatric: She has a normal mood and affect. Her behavior is normal.  Nursing note and vitals reviewed.   Results for orders placed or performed in visit on 11/06/17  Microscopic Examination  Result Value Ref Range   WBC, UA None seen 0 - 5 /hpf   RBC, UA 0-2 0 - 2 /hpf   Epithelial Cells (non renal) 0-10 0 - 10 /hpf   Renal Epithel, UA None seen None seen /hpf   Bacteria, UA None seen None seen/Few  Hemoglobin, fingerstick  Result Value Ref Range   Hemoglobin 10.5 (L) 11.1 - 15.9 g/dL  CBC with Differential/Platelet  Result Value Ref Range   WBC 5.3 3.4 - 10.8 x10E3/uL   RBC 3.45 (L) 3.77 - 5.28 x10E6/uL   Hemoglobin 10.4 (L) 11.1 - 15.9 g/dL   Hematocrit 31.4 (L) 34.0 - 46.6 %   MCV 91 79 - 97 fL   MCH 30.1 26.6 - 33.0 pg   MCHC 33.1 31.5 - 35.7 g/dL   RDW 14.8 12.3 - 15.4 %   Platelets 205 150 - 379 x10E3/uL   Neutrophils 61 Not Estab. %   Lymphs 25 Not Estab. %   Monocytes 12 Not Estab. %   Eos 2 Not Estab. %   Basos 0 Not Estab. %   Neutrophils Absolute 3.3 1.4 - 7.0 x10E3/uL   Lymphocytes Absolute 1.3 0.7 - 3.1 x10E3/uL   Monocytes Absolute 0.6 0.1 - 0.9 x10E3/uL   EOS (ABSOLUTE) 0.1 0.0 -  0.4 x10E3/uL   Basophils Absolute 0.0 0.0 - 0.2 x10E3/uL   Immature Granulocytes 0 Not Estab. %   Immature Grans (Abs) 0.0 0.0 - 0.1 x10E3/uL  CMP14+EGFR  Result Value Ref Range   Glucose 128 (H) 65 - 99 mg/dL   BUN 24 10 - 36 mg/dL   Creatinine, Ser 0.90 0.57 - 1.00 mg/dL   GFR calc non Af Amer 53 (L) >59 mL/min/1.73   GFR calc Af  Amer 62 >59 mL/min/1.73   BUN/Creatinine Ratio 27 12 - 28   Sodium 135 134 - 144 mmol/L   Potassium 4.3 3.5 - 5.2 mmol/L   Chloride 96 96 - 106 mmol/L   CO2 24 20 - 29 mmol/L   Calcium 9.8 8.7 - 10.3 mg/dL   Total Protein 6.9 6.0 - 8.5 g/dL   Albumin 4.2 3.2 - 4.6 g/dL   Globulin, Total 2.7 1.5 - 4.5 g/dL   Albumin/Globulin Ratio 1.6 1.2 - 2.2   Bilirubin Total 0.8 0.0 - 1.2 mg/dL   Alkaline Phosphatase 58 39 - 117 IU/L   AST 23 0 - 40 IU/L   ALT 22 0 - 32 IU/L  Urinalysis, Complete  Result Value Ref Range   Specific Gravity, UA 1.015 1.005 - 1.030   pH, UA 6.0 5.0 - 7.5   Color, UA Yellow Yellow   Appearance Ur Clear Clear   Leukocytes, UA Negative Negative   Protein, UA Negative Negative/Trace   Glucose, UA Negative Negative   Ketones, UA Negative Negative   RBC, UA Negative Negative   Bilirubin, UA Negative Negative   Urobilinogen, Ur 0.2 0.2 - 1.0 mg/dL   Nitrite, UA Negative Negative   Microscopic Examination See below:       Assessment & Plan:   Problem List Items Addressed This Visit    None    Visit Diagnoses    Weakness    -  Primary   Patient continues to have generalized weakness, we have not been able to find any explanation for it she denies any visible bleeding from stool       Follow up plan: Return if symptoms worsen or fail to improve, for Follow-up made with Dr. Laurance Flatten for May 8.  Counseling provided for all of the vaccine components No orders of the defined types were placed in this encounter.   Caryl Pina, MD [facility] 11/12/2017, 3:35 PM

## 2017-11-26 ENCOUNTER — Encounter: Payer: Self-pay | Admitting: Family Medicine

## 2017-11-26 ENCOUNTER — Ambulatory Visit (INDEPENDENT_AMBULATORY_CARE_PROVIDER_SITE_OTHER): Payer: Medicare Other | Admitting: Family Medicine

## 2017-11-26 VITALS — BP 116/65 | HR 73 | Temp 98.8°F

## 2017-11-26 DIAGNOSIS — I482 Chronic atrial fibrillation: Secondary | ICD-10-CM | POA: Diagnosis not present

## 2017-11-26 DIAGNOSIS — I480 Paroxysmal atrial fibrillation: Secondary | ICD-10-CM

## 2017-11-26 DIAGNOSIS — K921 Melena: Secondary | ICD-10-CM

## 2017-11-26 DIAGNOSIS — Z7901 Long term (current) use of anticoagulants: Secondary | ICD-10-CM | POA: Diagnosis not present

## 2017-11-26 DIAGNOSIS — I4821 Permanent atrial fibrillation: Secondary | ICD-10-CM

## 2017-11-26 DIAGNOSIS — R7989 Other specified abnormal findings of blood chemistry: Secondary | ICD-10-CM

## 2017-11-26 DIAGNOSIS — R531 Weakness: Secondary | ICD-10-CM | POA: Diagnosis not present

## 2017-11-26 DIAGNOSIS — D509 Iron deficiency anemia, unspecified: Secondary | ICD-10-CM

## 2017-11-26 DIAGNOSIS — R195 Other fecal abnormalities: Secondary | ICD-10-CM

## 2017-11-26 DIAGNOSIS — R042 Hemoptysis: Secondary | ICD-10-CM | POA: Diagnosis not present

## 2017-11-26 LAB — COAGUCHEK XS/INR WAIVED
INR: 1.3 — ABNORMAL HIGH (ref 0.9–1.1)
Prothrombin Time: 16 s

## 2017-11-26 NOTE — Patient Instructions (Addendum)
Continue with Coumadin at current dose and recheck pro time next Wednesday Continue to use assistance with moving around the house whether it is a walker or a cane Only do therapy when someone is present Continue to drink plenty of fluids and stay well-hydrated   Description   continue taking 2.5mg  on Mon and 5mg  all other days  INR today is 1.3 Goal is 2.0-3.0  Follow up next week

## 2017-11-26 NOTE — Progress Notes (Signed)
Subjective:    Patient ID: [name], female    DOB: 06/22/1917, 84 y.o.   MRN: [id number]  HPI Patient here today for spitting up blood and passing blood in stool. Family member states that she also has not been taking her meds correctly. The family had called in here yesterday with the above information and was advised by nurse to take her to the ER for evaluation. The family decided not to take her.  Patient comes with her caregiver to the visit today.  She will get a pro time and a CBC today.  She was recently spitting up some blood but not doing that today.  She is also passing some blood in her bowel movements.  She has missed some Coumadin doses.  She just complains of weakness.  Her vital signs are stable with a blood pressure 116/65 with a pulse rate of 73 and is afebrile.  The patient's stools are dark color but she has been and continues to take iron.  The patient's caregiver today is her niece.  The usual caregiver has been on vacation this week and that may be part of the problem that she did not get any Coumadin for several days from last Saturday may be through Monday of this week and just got started back on the Coumadin.  She had some blood in her mouth on Monday and Tuesday of this week which was 2 days ago and has not seen any more blood since that time.  The INR today was 1.3 but she has missed a few days of Coumadin.  We will not make any changes with that and will ask her to repeat the pro time next Wednesday.  The patient does not have any chest pain or shortness of breath anymore than usual.  She is passing her water frequently but no discomfort with passing her water and her stools are dark because she is taking iron and she has not had any nausea or vomiting.  She just complains of being fatigued.  We will make sure that we checked a recent thyroid but with the blood work today including the CBC will also get a BMP.   Patient Active Problem List   Diagnosis Date Noted  .  Iron deficiency anemia 06/01/2017  . Permanent atrial fibrillation ([place]) 03/17/2017  . Abdominal aortic atherosclerosis ([place]) 05/29/2016  . Ventral hernia 12/08/2013  . Vitamin D deficiency 12/08/2013  . Breast cancer ([place]) 12/08/2013  . Long term current use of anticoagulant therapy 05/05/2013  . Shortness of breath 05/02/2013  . Restless leg syndrome 10/12/2010  . CAROTID BRUIT 08/21/2010  . MITRAL REGURGITATION 08/01/2009  . HYPERTENSION, BENIGN 08/17/2008  . AV BLOCK, COMPLETE 08/17/2008  . ATRIAL FIBRILLATION 08/17/2008  . PACEMAKER, PERMANENT 08/17/2008   Outpatient Encounter Medications as of 11/26/2017  Medication Sig  . b complex vitamins tablet Take 1 tablet by mouth daily.   . busPIRone (BUSPAR) 5 MG tablet Take 1 tablet (5 mg total) by mouth daily as needed.  . Cholecalciferol (VITAMIN D3) 1000 UNITS tablet Take 2,000 Units by mouth daily.    . Fe Fum-FePoly-Vit C-Vit B3 (INTEGRA) 62.5-62.5-40-3 MG CAPS TAKE ONE CAPSULE BY MOUTH ONCE DAILY.  . fish oil-omega-3 fatty acids 1000 MG capsule Take 2 g by mouth daily.  . furosemide (LASIX) 20 MG tablet Take 1 tablet (20 mg total) by mouth daily as needed.  . lactose free nutrition (BOOST) LIQD Take 237 mLs by mouth daily.  . polyethylene  glycol powder (GLYCOLAX/MIRALAX) powder Take 17 g by mouth 2 (two) times daily.  . vitamin C (ASCORBIC ACID) 500 MG tablet Take 500 mg by mouth daily.    . vitamin E 400 UNIT capsule Take 400 Units by mouth daily.  Marland Kitchen warfarin (COUMADIN) 5 MG tablet TAKE 1/2 OR 1 TABLET BY MOUTH ONCE A DAY OR AS INSTRUCTED BY COUMADIN ANTICOAGULATION CLINIC   No facility-administered encounter medications on file as of 11/26/2017.       Review of Systems  Constitutional: Positive for fatigue.  HENT: Negative.   Eyes: Negative.   Respiratory: Negative.        Spitting up blood   Cardiovascular: Negative.   Gastrointestinal: Positive for blood in stool.  Endocrine: Negative.   Genitourinary: Negative.     Musculoskeletal: Negative.   Skin: Negative.   Allergic/Immunologic: Negative.   Neurological: Positive for weakness.  Hematological: Negative.   Psychiatric/Behavioral: Negative.        Objective:   Physical Exam  Constitutional: She is oriented to person, place, and time. No distress.  Patient is elderly appearing and somewhat pale in color.  She is alert and responding appropriately to questions asked of her.  She has someone staying with her most the time but there are periods of time where there is no one staying with her.  She does not want any more home physical therapy.  She will do this with someone who is staying with her when they are there.  HENT:  Head: Normocephalic and atraumatic.  Right Ear: External ear normal.  Left Ear: External ear normal.  Nose: Nose normal.  Mouth/Throat: Oropharynx is clear and moist. No oropharyngeal exudate.  No sign of any nasal bleeding or bleeding in the throat or the gums.  Eyes: Pupils are equal, round, and reactive to light. Conjunctivae and EOM are normal. Right eye exhibits no discharge. Left eye exhibits no discharge.  Neck: Normal range of motion. Neck supple. No thyromegaly present.  Cardiovascular: Normal rate and regular rhythm.  Murmur heard. Heart is regular today at 72/min  Pulmonary/Chest: Effort normal and breath sounds normal. No respiratory distress. She has no wheezes. She has no rales.  Lungs are clear anteriorly and posteriorly  Abdominal: Soft. Bowel sounds are normal. She exhibits no mass. There is no tenderness. There is no guarding.  Musculoskeletal: She exhibits no edema or tenderness.  Patient was examined in the wheelchair today.  Lymphadenopathy:    She has no cervical adenopathy.  Neurological: She is alert and oriented to person, place, and time. She has normal reflexes. No cranial nerve deficit.  Skin: Skin is warm and dry. No rash noted.  Psychiatric: She has a normal mood and affect. Her behavior is  normal. Judgment and thought content normal.  Mood and affect appear stable for this patient  Nursing note and vitals reviewed.   BP 116/65 (BP Location: Left Arm)   Pulse 73   Temp 98.8 F (37.1 C) (Oral)        Assessment & Plan:  1. Weakness -Check BMP and CBC and thyroid profile - CoaguChek XS/INR Waived  2. Spitting up blood -Check CBC - CoaguChek XS/INR Waived  3. Blood in stool -CBC - CoaguChek XS/INR Waived  4. Long term current use of anticoagulant therapy -CBC and INR  5. Iron deficiency anemia, unspecified iron deficiency anemia type -Continue iron medication  6. Occult blood positive stool -Continue with iron replacement  7. Paroxysmal atrial fibrillation ([facility]) -Follow-up with cardiology as  planned and resume Coumadin at previous dose and recheck pro time in 6 days  8. Elevated TSH -Check thyroid profile today  Patient Instructions  Continue with Coumadin at current dose and recheck pro time next Wednesday Continue to use assistance with moving around the house whether it is a walker or a cane Only do therapy when someone is present Continue to drink plenty of fluids and stay well-hydrated  Arrie Senate MD

## 2017-11-27 ENCOUNTER — Other Ambulatory Visit: Payer: Medicare Other

## 2017-11-27 DIAGNOSIS — R410 Disorientation, unspecified: Secondary | ICD-10-CM | POA: Diagnosis not present

## 2017-11-27 DIAGNOSIS — R531 Weakness: Secondary | ICD-10-CM | POA: Diagnosis not present

## 2017-11-27 DIAGNOSIS — K625 Hemorrhage of anus and rectum: Secondary | ICD-10-CM | POA: Diagnosis not present

## 2017-11-27 LAB — URINALYSIS, COMPLETE
BILIRUBIN UA: NEGATIVE
Glucose, UA: NEGATIVE
Ketones, UA: NEGATIVE
NITRITE UA: NEGATIVE
PH UA: 7 (ref 5.0–7.5)
PROTEIN UA: NEGATIVE
Specific Gravity, UA: 1.01 (ref 1.005–1.030)
UUROB: 0.2 mg/dL (ref 0.2–1.0)

## 2017-11-27 LAB — MICROSCOPIC EXAMINATION

## 2017-11-28 LAB — URINE CULTURE: ORGANISM ID, BACTERIA: NO GROWTH

## 2017-12-01 LAB — FECAL OCCULT BLOOD, IMMUNOCHEMICAL: Fecal Occult Bld: POSITIVE — AB

## 2017-12-02 ENCOUNTER — Ambulatory Visit: Payer: Self-pay | Admitting: Family Medicine

## 2017-12-04 ENCOUNTER — Encounter: Payer: Medicare Other | Admitting: Pharmacist Clinician (PhC)/ Clinical Pharmacy Specialist

## 2017-12-04 LAB — CUP PACEART REMOTE DEVICE CHECK
Battery Remaining Longevity: 146 mo
Battery Remaining Percentage: 95.5 %
Implantable Lead Model: 5076
Lead Channel Pacing Threshold Amplitude: 0.625 V
Lead Channel Setting Pacing Amplitude: 0.875
Lead Channel Setting Sensing Sensitivity: 4 mV
MDC IDC LEAD IMPLANT DT: 20020418
MDC IDC LEAD LOCATION: 753860
MDC IDC MSMT BATTERY VOLTAGE: 2.96 V
MDC IDC MSMT LEADCHNL RV IMPEDANCE VALUE: 440 Ohm
MDC IDC MSMT LEADCHNL RV PACING THRESHOLD PULSEWIDTH: 0.4 ms
MDC IDC MSMT LEADCHNL RV SENSING INTR AMPL: 12 mV
MDC IDC PG IMPLANT DT: 20131226
MDC IDC SESS DTM: 20190410185809
MDC IDC SET LEADCHNL RV PACING PULSEWIDTH: 0.4 ms
MDC IDC STAT BRADY RV PERCENT PACED: 99 %
Pulse Gen Serial Number: 7331332

## 2017-12-11 ENCOUNTER — Ambulatory Visit (INDEPENDENT_AMBULATORY_CARE_PROVIDER_SITE_OTHER): Payer: Medicare Other | Admitting: Pharmacist Clinician (PhC)/ Clinical Pharmacy Specialist

## 2017-12-11 DIAGNOSIS — I482 Chronic atrial fibrillation: Secondary | ICD-10-CM | POA: Diagnosis not present

## 2017-12-11 DIAGNOSIS — I4821 Permanent atrial fibrillation: Secondary | ICD-10-CM

## 2017-12-11 DIAGNOSIS — Z7901 Long term (current) use of anticoagulants: Secondary | ICD-10-CM

## 2017-12-11 LAB — COAGUCHEK XS/INR WAIVED
INR: 1.9 — ABNORMAL HIGH (ref 0.9–1.1)
Prothrombin Time: 22.3 s

## 2017-12-11 NOTE — Patient Instructions (Addendum)
  Description   continue taking 2.5mg  on Mon and 5mg  all other days  INR today is 1.9  Goal is 2.0-3.0  Just slightly thick today

## 2017-12-31 ENCOUNTER — Other Ambulatory Visit: Payer: Self-pay | Admitting: Family Medicine

## 2018-01-08 ENCOUNTER — Ambulatory Visit: Payer: Medicare Other | Admitting: Pharmacist Clinician (PhC)/ Clinical Pharmacy Specialist

## 2018-01-08 DIAGNOSIS — Z7901 Long term (current) use of anticoagulants: Secondary | ICD-10-CM

## 2018-01-08 DIAGNOSIS — I4821 Permanent atrial fibrillation: Secondary | ICD-10-CM

## 2018-01-08 DIAGNOSIS — I482 Chronic atrial fibrillation: Secondary | ICD-10-CM | POA: Diagnosis not present

## 2018-01-08 LAB — COAGUCHEK XS/INR WAIVED
INR: 1.8 — ABNORMAL HIGH (ref 0.9–1.1)
Prothrombin Time: 21.8 s

## 2018-01-08 NOTE — Patient Instructions (Signed)
Description   Change to taking 5mg  every day of the week  INR today is 1.8  Goal is 2.0-3.0  Just slightly thick today

## 2018-02-03 ENCOUNTER — Telehealth: Payer: Self-pay | Admitting: Cardiology

## 2018-02-03 ENCOUNTER — Encounter: Payer: Medicare Other | Admitting: *Deleted

## 2018-02-03 NOTE — Telephone Encounter (Signed)
Confirmed remote transmission w/ pt caregiver.   

## 2018-02-04 ENCOUNTER — Encounter: Payer: Self-pay | Admitting: Cardiology

## 2018-02-05 ENCOUNTER — Ambulatory Visit (INDEPENDENT_AMBULATORY_CARE_PROVIDER_SITE_OTHER): Payer: Medicare Other | Admitting: Pharmacist Clinician (PhC)/ Clinical Pharmacy Specialist

## 2018-02-05 DIAGNOSIS — Z7901 Long term (current) use of anticoagulants: Secondary | ICD-10-CM

## 2018-02-05 DIAGNOSIS — I482 Chronic atrial fibrillation: Secondary | ICD-10-CM | POA: Diagnosis not present

## 2018-02-05 DIAGNOSIS — I4821 Permanent atrial fibrillation: Secondary | ICD-10-CM

## 2018-02-05 LAB — COAGUCHEK XS/INR WAIVED
INR: 2.5 — ABNORMAL HIGH (ref 0.9–1.1)
PROTHROMBIN TIME: 30.5 s

## 2018-02-05 NOTE — Patient Instructions (Signed)
Description   Continue taking 5mg  every day of the week  INR today is 2.5  Goal is 2.0-3.0  Perfect reading today

## 2018-02-12 ENCOUNTER — Other Ambulatory Visit: Payer: Self-pay | Admitting: Family Medicine

## 2018-02-18 ENCOUNTER — Telehealth: Payer: Self-pay | Admitting: Family Medicine

## 2018-02-18 NOTE — Telephone Encounter (Signed)
Aware this is ok

## 2018-02-23 ENCOUNTER — Ambulatory Visit: Payer: Medicare Other | Admitting: Family Medicine

## 2018-02-25 ENCOUNTER — Ambulatory Visit (INDEPENDENT_AMBULATORY_CARE_PROVIDER_SITE_OTHER): Payer: Medicare Other | Admitting: Family

## 2018-02-25 ENCOUNTER — Ambulatory Visit (INDEPENDENT_AMBULATORY_CARE_PROVIDER_SITE_OTHER): Payer: Medicare Other

## 2018-02-25 ENCOUNTER — Encounter: Payer: Self-pay | Admitting: Family

## 2018-02-25 VITALS — BP 133/75 | HR 74 | Temp 97.5°F | Ht 66.0 in

## 2018-02-25 DIAGNOSIS — R0781 Pleurodynia: Secondary | ICD-10-CM | POA: Diagnosis not present

## 2018-02-25 DIAGNOSIS — S2232XA Fracture of one rib, left side, initial encounter for closed fracture: Secondary | ICD-10-CM

## 2018-02-25 NOTE — Patient Instructions (Signed)
Rib Fracture ° °A rib fracture is a break or crack in one of the bones of the ribs. The ribs are a group of long, curved bones that wrap around your chest and attach to your spine. They protect your lungs and other organs in the chest cavity. A broken or cracked rib is often painful, but most do not cause other problems. Most rib fractures heal on their own over time. However, rib fractures can be more serious if multiple ribs are broken or if broken ribs move out of place and push against other structures. °What are the causes? °· A direct blow to the chest. For example, this could happen during contact sports, a car accident, or a fall against a hard object. °· Repetitive movements with high force, such as pitching a baseball or having severe coughing spells. °What are the signs or symptoms? °· Pain when you breathe in or cough. °· Pain when someone presses on the injured area. °How is this diagnosed? °Your caregiver will perform a physical exam. Various imaging tests may be ordered to confirm the diagnosis and to look for related injuries. These tests may include a chest X-ray, computed tomography (CT), magnetic resonance imaging (MRI), or a bone scan. °How is this treated? °Rib fractures usually heal on their own in 1-3 months. The longer healing period is often associated with a continued cough or other aggravating activities. During the healing period, pain control is very important. Medication is usually given to control pain. Hospitalization or surgery may be needed for more severe injuries, such as those in which multiple ribs are broken or the ribs have moved out of place. °Follow these instructions at home: °· Avoid strenuous activity and any activities or movements that cause pain. Be careful during activities and avoid bumping the injured rib. °· Gradually increase activity as directed by your caregiver. °· Only take over-the-counter or prescription medications as directed by your caregiver. Do not take  other medications without asking your caregiver first. °· Apply ice to the injured area for the first 1-2 days after you have been treated or as directed by your caregiver. Applying ice helps to reduce inflammation and pain. °? Put ice in a plastic bag. °? Place a towel between your skin and the bag. °? Leave the ice on for 15-20 minutes at a time, every 2 hours while you are awake. °· Perform deep breathing as directed by your caregiver. This will help prevent pneumonia, which is a common complication of a broken rib. Your caregiver may instruct you to: °? Take deep breaths several times a day. °? Try to cough several times a day, holding a pillow against the injured area. °? Use a device called an incentive spirometer to practice deep breathing several times a day. °· Drink enough fluids to keep your urine clear or pale yellow. This will help you avoid constipation. °· Do not wear a rib belt or binder. These restrict breathing, which can lead to pneumonia. °Get help right away if: °· You have a fever. °· You have difficulty breathing or shortness of breath. °· You develop a continual cough, or you cough up thick or bloody sputum. °· You feel sick to your stomach (nausea), throw up (vomit), or have abdominal pain. °· You have worsening pain not controlled with medications. °This information is not intended to replace advice given to you by your health care provider. Make sure you discuss any questions you have with your health care provider. °Document Released: 07/14/2005   Document Revised: 12/20/2015 Document Reviewed: 09/15/2012 °Elsevier Interactive Patient Education © 2018 [employer] ° °

## 2018-02-25 NOTE — Progress Notes (Signed)
   Subjective:    Patient ID: [name], female    DOB: August 24, 1916, 84 y.o.   MRN: [id number]  Chief Complaint  Patient presents with  . fall with bruising and pain    Fall  The accident occurred 3 to 5 days ago. The fall occurred while standing. She fell from a height of 3 to 5 ft. She landed on hard floor. There was no blood loss. Point of impact: back, right and left ribs. The pain is present in the back. The pain is moderate (when "I move"). The symptoms are aggravated by ambulation. Pertinent negatives include no fever, hematuria, loss of consciousness, tingling or visual change. She has tried rest for the symptoms. The treatment provided mild relief.      Review of Systems  Constitutional: Negative for fever.  Genitourinary: Negative for hematuria.  Neurological: Negative for tingling and loss of consciousness.  All other systems reviewed and are negative.      Objective:   Physical Exam  Constitutional: She is oriented to person, place, and time. She appears well-developed and well-nourished. No distress.  HENT:  Head: Normocephalic.  Eyes: Pupils are equal, round, and reactive to light.  Neck: Normal range of motion. Neck supple. No thyromegaly present.  Cardiovascular: Normal rate, regular rhythm and intact distal pulses.  Murmur heard. Pulmonary/Chest: Effort normal and breath sounds normal. No respiratory distress. She has no wheezes.  Abdominal: Soft. Bowel sounds are normal. She exhibits no distension. There is no tenderness.  Musculoskeletal: She exhibits no edema or tenderness.  Generalized weakness, in wheelchair   Neurological: She is alert and oriented to person, place, and time. She has normal reflexes. No cranial nerve deficit.  Skin: Skin is warm and dry. Ecchymosis noted.     Psychiatric: She has a normal mood and affect. Her behavior is normal. Judgment and thought content normal.  Vitals reviewed.   X-ray- Fifth rib fracture, Preliminary  reading by Evelina Dun, FNP [facility]   BP 133/75   Pulse 74   Temp (!) 97.5 F (36.4 C) (Oral)   Ht 5\' 6"  (1.676 m)   BMI 23.40 kg/m      Assessment & Plan:  [name] was seen today for fall with bruising and pain.  Diagnoses and all orders for this visit:  Rib pain on left side -     DG Ribs Unilateral W/Chest Left; Future  Closed fracture of one rib of left side, initial encounter   Fall prevention discussed Encouraged deep breathing and coughing Tylenol prn for pain RTO if symptoms worsen or do not improve, keep follow up with PCP Go to ED if any SOB  Evelina Dun, FNP

## 2018-02-26 ENCOUNTER — Telehealth: Payer: Self-pay | Admitting: Family Medicine

## 2018-02-26 NOTE — Telephone Encounter (Signed)
Returned [employer].  [name] states that patient skipped her coumadin on Tuesday and Thursday, patient is worried that medication is making her fall.  Informed patient of the importance of taking this medication as prescribed to keep her INR at goal.  Patient verbalized understanding and states that she will start taking her medication as prescribed

## 2018-03-11 ENCOUNTER — Encounter: Payer: Self-pay | Admitting: Cardiology

## 2018-03-19 ENCOUNTER — Ambulatory Visit: Payer: Medicare Other | Admitting: Pharmacist Clinician (PhC)/ Clinical Pharmacy Specialist

## 2018-03-19 DIAGNOSIS — Z7901 Long term (current) use of anticoagulants: Secondary | ICD-10-CM | POA: Diagnosis not present

## 2018-03-19 DIAGNOSIS — I482 Chronic atrial fibrillation: Secondary | ICD-10-CM | POA: Diagnosis not present

## 2018-03-19 DIAGNOSIS — I4821 Permanent atrial fibrillation: Secondary | ICD-10-CM

## 2018-03-19 LAB — COAGUCHEK XS/INR WAIVED
INR: 1.5 — AB (ref 0.9–1.1)
Prothrombin Time: 18.6 s

## 2018-03-19 NOTE — Patient Instructions (Signed)
Description   Take 1 1/2 tablets today and tomorrow then Continue taking 5mg  every day of the week  INR today is 1.5  Goal is 2.0-3.0

## 2018-03-31 ENCOUNTER — Ambulatory Visit: Payer: Medicare Other | Admitting: Family Medicine

## 2018-04-06 ENCOUNTER — Encounter: Payer: Self-pay | Admitting: Family Medicine

## 2018-04-06 ENCOUNTER — Ambulatory Visit (INDEPENDENT_AMBULATORY_CARE_PROVIDER_SITE_OTHER): Payer: Medicare Other | Admitting: Family Medicine

## 2018-04-06 VITALS — BP 118/72 | HR 69 | Temp 97.9°F | Ht 66.0 in

## 2018-04-06 DIAGNOSIS — I482 Chronic atrial fibrillation: Secondary | ICD-10-CM | POA: Diagnosis not present

## 2018-04-06 DIAGNOSIS — E7800 Pure hypercholesterolemia, unspecified: Secondary | ICD-10-CM | POA: Diagnosis not present

## 2018-04-06 DIAGNOSIS — E559 Vitamin D deficiency, unspecified: Secondary | ICD-10-CM | POA: Diagnosis not present

## 2018-04-06 DIAGNOSIS — Z7901 Long term (current) use of anticoagulants: Secondary | ICD-10-CM | POA: Diagnosis not present

## 2018-04-06 DIAGNOSIS — I10 Essential (primary) hypertension: Secondary | ICD-10-CM

## 2018-04-06 DIAGNOSIS — D509 Iron deficiency anemia, unspecified: Secondary | ICD-10-CM

## 2018-04-06 DIAGNOSIS — I480 Paroxysmal atrial fibrillation: Secondary | ICD-10-CM

## 2018-04-06 DIAGNOSIS — R54 Age-related physical debility: Secondary | ICD-10-CM

## 2018-04-06 DIAGNOSIS — I4821 Permanent atrial fibrillation: Secondary | ICD-10-CM

## 2018-04-06 DIAGNOSIS — L608 Other nail disorders: Secondary | ICD-10-CM

## 2018-04-06 DIAGNOSIS — R7989 Other specified abnormal findings of blood chemistry: Secondary | ICD-10-CM

## 2018-04-06 DIAGNOSIS — H6122 Impacted cerumen, left ear: Secondary | ICD-10-CM

## 2018-04-06 LAB — COAGUCHEK XS/INR WAIVED
INR: 2.9 — AB (ref 0.9–1.1)
PROTHROMBIN TIME: 35 s

## 2018-04-06 NOTE — Progress Notes (Signed)
Subjective:    Patient ID: [name], female    DOB: June 19, 1917, 84 y.o.   MRN: [id number]  HPI Pt here for follow up and management of chronic medical problems which includes hyperlipidemia and hypertension. She is taking medication regularly.  Kelly's biggest complaint today is weakness.  She comes into the office today in a wheelchair.  Her INR is 2.9 and we will ask her to continue with the Coumadin as currently doing but to take 1/2 pill today or 1/2 pill tomorrow only because of the INR being a little bit at the upper end of the range that we like to keep it at..  We will call with results of the lab work as soon as it becomes available.  Her vital signs are stable.  She comes with a caregiver today.  Olivia Mackie is her daily caregiver.  She has 2 other caregivers 1 of whom is her grandson.  According to Olivia Mackie she is eating well and drinking fluids well.  She has a bedside commode.  Today she denies any chest pain pressure tightness or shortness of breath.  She is swallowing her food well.  Her stools are dark in color because of taking iron.  She is passing her water without problems.  There is some concern about the toenail growth especially on the right foot.  We will arrange for her to have an appointment with Dr. Irving Shows to do some trimming of these nails.  Her INR today was 2.9 and for her age of 84 we will have her take only a half of a Coumadin tomorrow and have her come back in after resuming Coumadin at 5 mg a day repeat the INR again next Wednesday.    Patient Active Problem List   Diagnosis Date Noted  . Iron deficiency anemia 06/01/2017  . Permanent atrial fibrillation ([place]) 03/17/2017  . Abdominal aortic atherosclerosis ([place]) 05/29/2016  . Ventral hernia 12/08/2013  . Vitamin D deficiency 12/08/2013  . Breast cancer ([place]) 12/08/2013  . Long term current use of anticoagulant therapy 05/05/2013  . Shortness of breath 05/02/2013  . Restless leg syndrome 10/12/2010  . CAROTID  BRUIT 08/21/2010  . MITRAL REGURGITATION 08/01/2009  . HYPERTENSION, BENIGN 08/17/2008  . AV BLOCK, COMPLETE 08/17/2008  . ATRIAL FIBRILLATION 08/17/2008  . PACEMAKER, PERMANENT 08/17/2008   Outpatient Encounter Medications as of 04/06/2018  Medication Sig  . b complex vitamins tablet Take 1 tablet by mouth daily.   . busPIRone (BUSPAR) 5 MG tablet Take 1 tablet (5 mg total) by mouth daily as needed.  . Cholecalciferol (VITAMIN D3) 1000 UNITS tablet Take 2,000 Units by mouth daily.    . Fe Fum-FePoly-Vit C-Vit B3 (INTEGRA) 62.5-62.5-40-3 MG CAPS TAKE ONE CAPSULE BY MOUTH ONCE DAILY.  . fish oil-omega-3 fatty acids 1000 MG capsule Take 2 g by mouth daily.  . furosemide (LASIX) 20 MG tablet Take 1 tablet (20 mg total) by mouth daily as needed.  . lactose free nutrition (BOOST) LIQD Take 237 mLs by mouth daily.  . polyethylene glycol powder (GLYCOLAX/MIRALAX) powder TAKE 17 GRAMS (1 CAPFUL) IN 8 OZ OF FLUID TWICE DAILY  . vitamin C (ASCORBIC ACID) 500 MG tablet Take 500 mg by mouth daily.    . vitamin E 400 UNIT capsule Take 400 Units by mouth daily.  Marland Kitchen warfarin (COUMADIN) 5 MG tablet TAKE 1/2 OR 1 TABLET BY MOUTH ONCE A DAY OR AS INSTRUCTED BY COUMADIN ANTICOAGULATION CLINIC   No facility-administered encounter medications on file  as of 04/06/2018.      Review of Systems  Constitutional: Negative.   HENT: Negative.   Eyes: Negative.   Respiratory: Negative.   Cardiovascular: Negative.   Gastrointestinal: Negative.   Endocrine: Negative.   Genitourinary: Negative.   Musculoskeletal: Negative.   Skin: Negative.   Allergic/Immunologic: Negative.   Neurological: Positive for weakness.  Hematological: Negative.   Psychiatric/Behavioral: Negative.        Objective:   Physical Exam  Constitutional: She is oriented to person, place, and time. No distress.  And is elderly and somewhat frail appearing but still alert despite being 84 years old.  HENT:  Head: Normocephalic and  atraumatic.  Right Ear: External ear normal.  Nose: Nose normal.  Mouth/Throat: Oropharynx is clear and moist.  Cerumen impaction left ear canal--- irrigation to remove  Eyes: Pupils are equal, round, and reactive to light. Conjunctivae and EOM are normal. Right eye exhibits no discharge. Left eye exhibits no discharge. No scleral icterus.  Neck: Normal range of motion. Neck supple. No thyromegaly present.  No thyroid enlargement or anterior cervical adenopathy.  Cardiovascular: Normal rate, regular rhythm and intact distal pulses.  Murmur heard. The heart is regular at 60/min with a grade 3/6 systolic ejection murmur.  Pulmonary/Chest: Effort normal and breath sounds normal. She has no wheezes. She has no rales.  Clear anteriorly and posteriorly  Abdominal: Soft. Bowel sounds are normal. She exhibits no mass. There is no tenderness.  Some abdominal gas but no liver or spleen enlargement epigastric tenderness or suprapubic tenderness.  Patient was examined while in the wheelchair.  Musculoskeletal: She exhibits no edema or tenderness.  Patient was in wheelchair today and was not walking today but cannot walk at home with a walker.  Lymphadenopathy:    She has no cervical adenopathy.  Neurological: She is alert and oriented to person, place, and time. She has normal reflexes.  Skin: Skin is warm and dry. No rash noted.  Excessive toenail growth on all the toes of the right foot which need trimming severely.  The nails on the left foot also need trimming but they are not anywhere as bad as the thickened nails on the right.  Psychiatric: She has a normal mood and affect. Her behavior is normal. Judgment and thought content normal.  The patient's mood affect and behavior are normal for her and she is and remains alert despite her sullen appearance.  Nursing note and vitals reviewed.   BP 118/72 (BP Location: Right Wrist)   Pulse 69   Temp 97.9 F (36.6 C) (Oral)   Ht 5' 6"  (1.676 m)   BMI  23.40 kg/m  Ear irrigation to remove cerumen left ear canal.     Assessment & Plan:  1. Permanent atrial fibrillation (HCC) -The patient was actually in normal sinus rhythm today at 60/min with grade 3/6 systolic ejection murmur. - CBC with Differential/Platelet - CoaguChek XS/INR Waived  2. Vitamin D deficiency -Continue vitamin D replacement pending results of lab work - CBC with Differential/Platelet - VITAMIN D 25 Hydroxy (Vit-D Deficiency, Fractures)  3. HYPERTENSION, BENIGN -Blood pressure is good today she will continue with current treatment - CBC with Differential/Platelet - BMP8+EGFR - Hepatic function panel  4. Pure hypercholesterolemia -She will continue with a healthy diet. - CBC with Differential/Platelet - Lipid panel  5. Elevated TSH -Check thyroid today - Thyroid Panel With TSH  6. Long term current use of anticoagulant therapy -Continue with Coumadin but shoot for a number in  the lower range between 2 and 3 closer to 2. -She will only take a half of a Coumadin tomorrow and will have the pro time rechecked again next Wednesday.  If the numbers not getting back closer to 2 then we will need to reduce the Coumadin slightly on a weekly basis.  7. Iron deficiency anemia, unspecified iron deficiency anemia type -Continue with iron replacement pending results of CBC  8. Paroxysmal atrial fibrillation (HCC) -Heart was in normal sinus rhythm today  9. Frailty syndrome in geriatric patient -Make all efforts to keep her from falling at home. -Continue to drink plenty of water and fluids  10. Hearing loss due to cerumen impaction, left -Ear irrigation to remove cerumen left ear canal  Patient Instructions                        Medicare Annual Wellness Visit  [facility] and the medical providers at [facility] strive to bring you the best medical care.  In doing so we not only want to address your current medical conditions and  concerns but also to detect new conditions early and prevent illness, disease and health-related problems.    Medicare offers a yearly Wellness Visit which allows our clinical staff to assess your need for preventative services including immunizations, lifestyle education, counseling to decrease risk of preventable diseases and screening for fall risk and other medical concerns.    This visit is provided free of charge (no copay) for all Medicare recipients. The clinical pharmacists at [facility] have begun to conduct these Wellness Visits which will also include a thorough review of all your medications.    As you primary medical provider recommend that you make an appointment for your Annual Wellness Visit if you have not done so already this year.  You may set up this appointment before you leave today or you may call back ([phone]) and schedule an appointment.  Please make sure when you call that you mention that you are scheduling your Annual Wellness Visit with the clinical pharmacist so that the appointment may be made for the proper length of time.     Description   Take 1/2 tablet  tomorrow then Continue taking 32m every day of the week  INR today is 2.9  Goal is 2.0-3.0  Follow up on 9/18 with MSharyn Lull    Continue current medications. Continue good therapeutic lifestyle changes which include good diet and exercise. Fall precautions discussed with patient. If an FOBT was given today- please return it to our front desk. If you are over 578years old - you may need Prevnar 150or the adult Pneumonia vaccine.  **Flu shots are available--- please call and schedule a FLU-CLINIC appointment**  After your visit with u[place]oreatoday you will receive a survey in the mail or online from [employer]arding your care with u[place] Please take a moment to fill this out. Your feedback is very important to u[place]oreaas you can help uKoreabetter understand your patient needs as well as improve  your experience and satisfaction. WE CARE ABOUT YOU!!!   Reduce Coumadin tomorrow only and take 1/2 pill.  Resume 1 pill daily after tomorrow and have pro time rechecked again next Wednesday.  We would prefer to keep her blood thinner closer to the 2 range then up to the 3 range because of her age. The patient should use her walker at home, should be moving  slowly and so she does not put herself at risk for falling. She should continue to drink plenty of fluids and stay well-hydrated  .  Arrie Senate MD

## 2018-04-06 NOTE — Addendum Note (Signed)
Addended by: Zannie Cove on: 04/06/2018 04:52 PM   Modules accepted: Orders

## 2018-04-06 NOTE — Patient Instructions (Addendum)
Medicare Annual Wellness Visit  [facility] and the medical providers at [facility] strive to bring you the best medical care.  In doing so we not only want to address your current medical conditions and concerns but also to detect new conditions early and prevent illness, disease and health-related problems.    Medicare offers a yearly Wellness Visit which allows our clinical staff to assess your need for preventative services including immunizations, lifestyle education, counseling to decrease risk of preventable diseases and screening for fall risk and other medical concerns.    This visit is provided free of charge (no copay) for all Medicare recipients. The clinical pharmacists at [facility] have begun to conduct these Wellness Visits which will also include a thorough review of all your medications.    As you primary medical provider recommend that you make an appointment for your Annual Wellness Visit if you have not done so already this year.  You may set up this appointment before you leave today or you may call back ([phone]) and schedule an appointment.  Please make sure when you call that you mention that you are scheduling your Annual Wellness Visit with the clinical pharmacist so that the appointment may be made for the proper length of time.     Description   Take 1/2 tablet  tomorrow then Continue taking 5mg  every day of the week  INR today is 2.9  Goal is 2.0-3.0  Follow up on 9/18 with Sharyn Lull     Continue current medications. Continue good therapeutic lifestyle changes which include good diet and exercise. Fall precautions discussed with patient. If an FOBT was given today- please return it to our front desk. If you are over 68 years old - you may need Prevnar 76 or the adult Pneumonia vaccine.  **Flu shots are available--- please call and schedule a FLU-CLINIC appointment**  After your visit with [place]  today you will receive a survey in the mail or online from [employer] regarding your care with [place]. Please take a moment to fill this out. Your feedback is very important to [place] as you can help [place] better understand your patient needs as well as improve your experience and satisfaction. WE CARE ABOUT YOU!!!   Reduce Coumadin tomorrow only and take 1/2 pill.  Resume 1 pill daily after tomorrow and have pro time rechecked again next Wednesday.  We would prefer to keep her blood thinner closer to the 2 range then up to the 3 range because of her age. The patient should use her walker at home, should be moving slowly and so she does not put herself at risk for falling. She should continue to drink plenty of fluids and stay well-hydrated  .

## 2018-04-07 LAB — THYROID PANEL WITH TSH
Free Thyroxine Index: 1.5 (ref 1.2–4.9)
T3 UPTAKE RATIO: 27 % (ref 24–39)
T4, Total: 5.4 ug/dL (ref 4.5–12.0)
TSH: 3.21 u[IU]/mL (ref 0.450–4.500)

## 2018-04-07 LAB — CBC WITH DIFFERENTIAL/PLATELET
Basophils Absolute: 0 10*3/uL (ref 0.0–0.2)
Basos: 1 %
EOS (ABSOLUTE): 0.2 10*3/uL (ref 0.0–0.4)
Eos: 3 %
HEMATOCRIT: 33.9 % — AB (ref 34.0–46.6)
HEMOGLOBIN: 11.2 g/dL (ref 11.1–15.9)
IMMATURE GRANS (ABS): 0 10*3/uL (ref 0.0–0.1)
Immature Granulocytes: 0 %
LYMPHS ABS: 2 10*3/uL (ref 0.7–3.1)
LYMPHS: 34 %
MCH: 29 pg (ref 26.6–33.0)
MCHC: 33 g/dL (ref 31.5–35.7)
MCV: 88 fL (ref 79–97)
MONOCYTES: 11 %
MONOS ABS: 0.6 10*3/uL (ref 0.1–0.9)
NEUTROS ABS: 2.9 10*3/uL (ref 1.4–7.0)
Neutrophils: 51 %
Platelets: 205 10*3/uL (ref 150–450)
RBC: 3.86 x10E6/uL (ref 3.77–5.28)
RDW: 13 % (ref 12.3–15.4)
WBC: 5.8 10*3/uL (ref 3.4–10.8)

## 2018-04-07 LAB — HEPATIC FUNCTION PANEL
ALT: 18 IU/L (ref 0–32)
AST: 24 IU/L (ref 0–40)
Albumin: 4.2 g/dL (ref 3.2–4.6)
Alkaline Phosphatase: 72 IU/L (ref 39–117)
Bilirubin Total: 0.7 mg/dL (ref 0.0–1.2)
Bilirubin, Direct: 0.2 mg/dL (ref 0.00–0.40)
Total Protein: 6.6 g/dL (ref 6.0–8.5)

## 2018-04-07 LAB — BMP8+EGFR
BUN/Creatinine Ratio: 23 (ref 12–28)
BUN: 19 mg/dL (ref 10–36)
CO2: 27 mmol/L (ref 20–29)
CREATININE: 0.82 mg/dL (ref 0.57–1.00)
Calcium: 9.7 mg/dL (ref 8.7–10.3)
Chloride: 98 mmol/L (ref 96–106)
GFR calc Af Amer: 69 mL/min/{1.73_m2} (ref >59)
GFR, EST NON AFRICAN AMERICAN: 60 mL/min/{1.73_m2} (ref >59)
Glucose: 158 mg/dL — ABNORMAL HIGH (ref 65–99)
Potassium: 4.2 mmol/L (ref 3.5–5.2)
Sodium: 138 mmol/L (ref 134–144)

## 2018-04-07 LAB — VITAMIN D 25 HYDROXY (VIT D DEFICIENCY, FRACTURES): Vit D, 25-Hydroxy: 40.8 ng/mL (ref 30.0–100.0)

## 2018-04-07 LAB — LIPID PANEL
CHOL/HDL RATIO: 4 ratio (ref 0.0–4.4)
Cholesterol, Total: 232 mg/dL — ABNORMAL HIGH (ref 100–199)
HDL: 58 mg/dL (ref >39)
LDL CALC: 145 mg/dL — AB (ref 0–99)
Triglycerides: 147 mg/dL (ref 0–149)
VLDL CHOLESTEROL CAL: 29 mg/dL (ref 5–40)

## 2018-04-08 ENCOUNTER — Other Ambulatory Visit: Payer: Self-pay | Admitting: Family Medicine

## 2018-04-14 ENCOUNTER — Ambulatory Visit (INDEPENDENT_AMBULATORY_CARE_PROVIDER_SITE_OTHER): Payer: Medicare Other | Admitting: Pharmacist Clinician (PhC)/ Clinical Pharmacy Specialist

## 2018-04-14 DIAGNOSIS — I482 Chronic atrial fibrillation: Secondary | ICD-10-CM | POA: Diagnosis not present

## 2018-04-14 DIAGNOSIS — I4821 Permanent atrial fibrillation: Secondary | ICD-10-CM

## 2018-04-14 DIAGNOSIS — Z7901 Long term (current) use of anticoagulants: Secondary | ICD-10-CM

## 2018-04-14 LAB — COAGUCHEK XS/INR WAIVED
INR: 2.4 — AB (ref 0.9–1.1)
PROTHROMBIN TIME: 28.2 s

## 2018-04-14 NOTE — Patient Instructions (Signed)
Description   Continue taking 5mg  every day  INR today is 2.4  Goal is 2.0-3.0

## 2018-05-13 ENCOUNTER — Ambulatory Visit (INDEPENDENT_AMBULATORY_CARE_PROVIDER_SITE_OTHER): Payer: Medicare Other | Admitting: *Deleted

## 2018-05-13 DIAGNOSIS — I442 Atrioventricular block, complete: Secondary | ICD-10-CM

## 2018-05-14 NOTE — Progress Notes (Signed)
Remote pacemaker transmission.   

## 2018-05-19 ENCOUNTER — Ambulatory Visit (INDEPENDENT_AMBULATORY_CARE_PROVIDER_SITE_OTHER): Payer: Medicare Other | Admitting: Pharmacist Clinician (PhC)/ Clinical Pharmacy Specialist

## 2018-05-19 ENCOUNTER — Encounter (INDEPENDENT_AMBULATORY_CARE_PROVIDER_SITE_OTHER): Payer: Self-pay

## 2018-05-19 DIAGNOSIS — Z23 Encounter for immunization: Secondary | ICD-10-CM

## 2018-05-19 DIAGNOSIS — Z7901 Long term (current) use of anticoagulants: Secondary | ICD-10-CM

## 2018-05-19 DIAGNOSIS — I4821 Permanent atrial fibrillation: Secondary | ICD-10-CM | POA: Diagnosis not present

## 2018-05-19 LAB — COAGUCHEK XS/INR WAIVED
INR: 3 — ABNORMAL HIGH (ref 0.9–1.1)
PROTHROMBIN TIME: 36.3 s

## 2018-05-19 NOTE — Patient Instructions (Signed)
Description   Change to taking 1/2 tablet on Wednesdays and Saturdays and 1 tablet all other days of the week.  INR today is 3.0  Goal is 2.0-3.0

## 2018-05-20 ENCOUNTER — Other Ambulatory Visit: Payer: Self-pay | Admitting: Family Medicine

## 2018-06-16 ENCOUNTER — Telehealth: Payer: Self-pay | Admitting: Family Medicine

## 2018-06-16 ENCOUNTER — Encounter: Payer: Self-pay | Admitting: Pharmacist Clinician (PhC)/ Clinical Pharmacy Specialist

## 2018-06-16 NOTE — Telephone Encounter (Signed)
Follow through with these recommendations so we can proceed with home INR's

## 2018-06-16 NOTE — Telephone Encounter (Signed)
Patient has gait instability and ongoing weakness because of advancing age along with paroxysmal atrial fibrillation.

## 2018-06-16 NOTE — Telephone Encounter (Signed)
Pt's visit on 04/06/18 visit is within 90 days, pt needs another need for a nursing order along with the INR

## 2018-06-16 NOTE — Telephone Encounter (Signed)
appt made for face to face on Tuesday with [name] (no availability with Laurance Flatten)

## 2018-06-16 NOTE — Telephone Encounter (Signed)
Please arrange if possible INR is at patient's home and these should be done weekly and reported to [place] on a weekly basis.  This is because of her paroxysmal atrial fibrillation.

## 2018-06-16 NOTE — Telephone Encounter (Signed)
Called caregiver to let her know that Tye [place] said patient has to be seen by Dr. Laurance Flatten first for a visit to document and set up home health services.  She needs to come in today for a protime and then will see Dr. Laurance Flatten in January to have services set up

## 2018-06-16 NOTE — Telephone Encounter (Signed)
Please review

## 2018-06-22 ENCOUNTER — Encounter: Payer: Self-pay | Admitting: Family Medicine

## 2018-06-22 ENCOUNTER — Ambulatory Visit (INDEPENDENT_AMBULATORY_CARE_PROVIDER_SITE_OTHER): Payer: Medicare Other | Admitting: Family Medicine

## 2018-06-22 VITALS — BP 117/61 | HR 69 | Temp 97.4°F

## 2018-06-22 DIAGNOSIS — I4821 Permanent atrial fibrillation: Secondary | ICD-10-CM | POA: Diagnosis not present

## 2018-06-22 DIAGNOSIS — Z7901 Long term (current) use of anticoagulants: Secondary | ICD-10-CM

## 2018-06-22 DIAGNOSIS — Z993 Dependence on wheelchair: Secondary | ICD-10-CM | POA: Diagnosis not present

## 2018-06-22 LAB — COAGUCHEK XS/INR WAIVED
INR: 2.3 — ABNORMAL HIGH (ref 0.9–1.1)
Prothrombin Time: 27.4 s

## 2018-06-22 NOTE — Progress Notes (Signed)
Subjective: CC: Face to face for home health PCP: Chipper Herb, MD OYD:[name] is a 84 y.o. female presenting to clinic today for:  1.  Permanent atrial fibrillation with long-term use of anticoagulation Patient is on Coumadin 5 mg every day except for Wednesdays and Saturdays, she takes 1/2 tablet (2.5 mg).  She did have leafy greens with supper last evening.  She denies any vaginal bleeding, rectal bleeding or hematuria.  She has had scant nasal bleeding with blowing her nose hard but this is never been long-lasting or large in volume.  She uses humidification at home.  No chest pain, palpitations or shortness of breath.  No lower extremity edema.  Her caregiver notes that she has quite a bit of difficulty making it to the appointments secondary to limited mobility.  They are wondering if there is another way that her INR can be checked from home.  She has 24-hour care assistance at home.   ROS: Per HPI  Allergies  Allergen Reactions  . Atorvastatin     unknown  . Boniva [Ibandronate Sodium]     unknown  . Chocolate     unknown  . Lovastatin     unknown  . Pravastatin Sodium     unknown  . Risedronate Sodium     unknown  . Toprol Xl [Metoprolol Succinate]     unknown  . Visken [Pindolol]     unknown  . Welchol [Colesevelam Hcl]     unknown  . Vesicare [Solifenacin] Anxiety and Other (See Comments)    lightheaded   Past Medical History:  Diagnosis Date  . Atrial fibrillation ([place])   . Carotid bruit   . Cataract   . Colon disorder    Adhesions  . Heart failure, diastolic ([place])   . Hyperlipidemia    statin intolerant  . Hypertension   . Mitral regurgitation    Moderate  . Nephrolithiasis   . Ovarian cyst   . QT prolongation    With solatol  . Restless leg syndrome   . S/P AV nodal ablation     Current Outpatient Medications:  .  b complex vitamins tablet, Take 1 tablet by mouth daily. , Disp: , Rfl:  .  busPIRone (BUSPAR) 5 MG tablet, Take 1  tablet (5 mg total) by mouth daily as needed., Disp: 15 tablet, Rfl: 0 .  Cholecalciferol (VITAMIN D3) 1000 UNITS tablet, Take 2,000 Units by mouth daily.  , Disp: , Rfl:  .  Fe Fum-FePoly-Vit C-Vit B3 (INTEGRA) 62.5-62.5-40-3 MG CAPS, TAKE ONE CAPSULE BY MOUTH EVERY DAILY, Disp: 90 capsule, Rfl: 0 .  fish oil-omega-3 fatty acids 1000 MG capsule, Take 2 g by mouth daily., Disp: , Rfl:  .  furosemide (LASIX) 20 MG tablet, Take 1 tablet (20 mg total) by mouth daily as needed., Disp: 90 tablet, Rfl: 3 .  lactose free nutrition (BOOST) LIQD, Take 237 mLs by mouth daily., Disp: , Rfl: 0 .  polyethylene glycol powder (GLYCOLAX/MIRALAX) powder, TAKE 17 GRAMS (1 CAPFUL) IN 8 OZ OF FLUID TWICE DAILY, Disp: 3162 g, Rfl: 0 .  vitamin C (ASCORBIC ACID) 500 MG tablet, Take 500 mg by mouth daily.  , Disp: , Rfl:  .  vitamin E 400 UNIT capsule, Take 400 Units by mouth daily., Disp: , Rfl:  .  warfarin (COUMADIN) 5 MG tablet, TAKE 1/2 OR 1 TABLET BY MOUTH ONCE A DAY OR AS INSTRUCTED BY COUMADIN ANTICOAGULATION CLINIC, Disp: 90 tablet, Rfl: 0 Social  History   Socioeconomic History  . Marital status: Divorced    Spouse name: Not on file  . Number of children: Not on file  . Years of education: Not on file  . Highest education level: Not on file  Occupational History  . Occupation: Retired    Fish farm manager: RETIRED  Social Needs  . Financial resource strain: Not on file  . Food insecurity:    Worry: Not on file    Inability: Not on file  . Transportation needs:    Medical: Not on file    Non-medical: Not on file  Tobacco Use  . Smoking status: Never Smoker  . Smokeless tobacco: Never Used  Substance and Sexual Activity  . Alcohol use: No  . Drug use: No  . Sexual activity: Never  Lifestyle  . Physical activity:    Days per week: Not on file    Minutes per session: Not on file  . Stress: Not on file  Relationships  . Social connections:    Talks on phone: Not on file    Gets together: Not on file      Attends religious service: Not on file    Active member of club or organization: Not on file    Attends meetings of clubs or organizations: Not on file    Relationship status: Not on file  . Intimate partner violence:    Fear of current or ex partner: Not on file    Emotionally abused: Not on file    Physically abused: Not on file    Forced sexual activity: Not on file  Other Topics Concern  . Not on file  Social History Narrative  . Not on file   Family History  Problem Relation Age of Onset  . Diabetes Mother        type 2  . Diabetes Father        type 2  . Cancer Sister 57       colon    Objective: Office vital signs reviewed. BP 117/61   Pulse 69   Temp (!) 97.4 F (36.3 C) (Oral)   Physical Examination:  General: Awake, alert, elderly, frail appearing female. HEENT: Normal, sclera white, MMM Cardio: regular rate and rhythm, S1S2 heard, mechanical sounding systolic murmur appreciated Pulm: clear to auscultation bilaterally, no wheezes, rhonchi or rales; normal work of breathing on room air Extremities: warm, well perfused, No edema, cyanosis or clubbing; +2 pulses bilaterally MSK: wheelchair dependent  Assessment/ Plan: 84 y.o. female   1. Permanent atrial fibrillation Rate controlled.  Her INR was therapeutic at 2.3 today.  Continue current Coumadin regimen.  I have completed the paperwork for her to have home INR checks.  We discussed that the home health agency would come out and demonstrate how to collect the blood.  I will also have her primary care doctor cosign this so that the results come to him.  We discussed that foods rich in vitamin K can alter her Coumadin levels.  She will follow-up as needed as directed with her PCP for ongoing healthcare needs. - CoaguChek XS/INR Waived  2. Long term current use of anticoagulant therapy  3. Wheelchair dependent    Orders Placed This Encounter  Procedures  . CoaguChek XS/INR Waived   No orders of the  defined types were placed in this encounter.    [name], DO [place] [phone]

## 2018-06-22 NOTE — Patient Instructions (Signed)
We have ordered the home Coumadin Check device.  A home health agency will come out to the house to show you how to check her Coumadin levels.  Her results will be communicated to the office weekly and adjustments can be made over the phone.  Continue to follow up with Dr Laurance Flatten as scheduled for other health care concerns.   Bleeding Precautions When on Anticoagulant Therapy WHAT IS ANTICOAGULANT THERAPY? Anticoagulant therapy is taking medicine to prevent or reduce blood clots. It is also called blood thinner therapy. Blood clots that form in your blood vessels can be dangerous. They can break loose and travel to your heart, lungs, or brain. This increases your risk of a heart attack or stroke. Anticoagulant therapy causes blood to clot more slowly. You may need anticoagulant therapy if you have:  A medical condition that increases the likelihood that blood clots will form.  A heart defect or a problem with heart rhythm. It is also a common treatment after heart surgery, such as valve replacement. WHAT ARE COMMON TYPES OF ANTICOAGULANT THERAPY? Anticoagulant medicine can be injected or taken by mouth.If you need anticoagulant therapy quickly at the hospital, the medicine may be injected under your skin or given through an IV tube. Heparin is a common example of an anticoagulant that you may get at the hospital. Most anticoagulant therapy is in the form of pills that you take at home every day. These may include:  Aspirin. This common blood thinner works by preventing blood cells (platelets) from sticking together to form a clot. Aspirin is not as strong as anticoagulants that slow down the time that it takes for your body to form a clot.  Clopidogrel. This is a newer type of drug that affects platelets. It is stronger than aspirin.  Warfarin. This is the most common anticoagulant. It changes the way your body uses vitamin K, a vitamin that helps your blood to clot. The risk of bleeding is higher  with warfarin than with aspirin. You will need frequent blood tests to make sure you are taking the safest amount.  New anticoagulants. Several new drugs have been approved. They are all taken by mouth. Studies show that these drugs work as well as warfarin. They do not require blood testing. They may cause less bleeding risk than warfarin. WHAT DO I NEED TO REMEMBER WHEN TAKING ANTICOAGULANT THERAPY? Anticoagulant therapy decreases your risk of forming a blood clot, but it increases your risk of bleeding. Work closely with your health care provider to make sure you are taking your medicine safely. These tips can help:  Learn ways to reduce your risk of bleeding.  If you are taking warfarin: ? Have blood tests as ordered by your health care provider. ? Do not make any sudden changes to your diet. Vitamin K in your diet can make warfarin less effective. ? Do not get pregnant. This medicine may cause birth defects.  Take your medicine at the same time every day. If you forget to take your medicine, take it as soon as you remember. If you miss a whole day, do not double your dose of medicine. Take your normal dose and call your health care provider to check in.  Do not stop taking your medicine on your own.  Tell your health care provider before you start taking any new medicine, vitamin, or herbal product. Some of these could interfere with your therapy.  Tell all of your health care providers that you are on anticoagulant therapy.  Do not have surgery, medical procedures, or dental work until you tell your health care provider that you are on anticoagulant therapy. WHAT CAN AFFECT HOW ANTICOAGULANTS WORK? Certain foods, vitamins, medicines, supplements, and herbal medicines change the way that anticoagulant therapy works. They may increase or decrease the effects of your anticoagulant therapy. Either result can be dangerous for you.  Many over-the-counter medicines for pain, colds, or stomach  problems interfere with anticoagulant therapy. Take these only as told by your health care provider.  Do not drink alcohol. It can interfere with your medicine and increase your risk of an injury that causes bleeding.  If you are taking warfarin, do not begin eating more foods that contain vitamin K. These include leafy green vegetables. Ask your health care provider if you should avoid any foods. WHAT ARE SOME WAYS TO PREVENT BLEEDING? You can prevent bleeding by taking certain precautions:  Be extra careful when you use knives, scissors, or other sharp objects.  Use an electric razor instead of a blade.  Do not use toothpicks.  Use a soft toothbrush.  Wear shoes that have nonskid soles.  Use bath mats and handrails in your bathroom.  Wear gloves while you do yard work.  Wear a helmet when you ride a bike.  Wear your seat belt.  Prevent falls by removing loose rugs and extension cords from areas where you walk.  Do not play contact sports or participate in other activities that have a high risk of injury. [place] PROVIDER? Call your health care provider if:  You miss a dose of medicine: ? And you are not sure what to do. ? For more than one day.  You have: ? Menstrual bleeding that is heavier than normal. ? Blood in your urine. ? A bloody nose or bleeding gums. ? Easy bruising. ? Blood in your stool (feces) or have black and tarry stool. ? Side effects from your medicine.  You feel weak or dizzy.  You become pregnant. Seek immediate medical care if:  You have bleeding that will not stop.  You have sudden and severe headache or belly pain.  You vomit or you cough up bright red blood.  You have a severe blow to your head. WHAT ARE SOME QUESTIONS TO ASK MY HEALTH CARE PROVIDER?  What is the best anticoagulant therapy for my condition?  What side effects should I watch for?  When should I take my medicine? What should I do if I  forget to take it?  Will I need to have regular blood tests?  Do I need to change my diet? Are there foods or drinks that I should avoid?  What activities are safe for me?  What should I do if I want to get pregnant? This information is not intended to replace advice given to you by your health care provider. Make sure you discuss any questions you have with your health care provider. Document Released: 06/25/2015 Document Reviewed: 06/25/2015 Elsevier Interactive Patient Education  2017 [employer].

## 2018-07-13 ENCOUNTER — Other Ambulatory Visit: Payer: Self-pay | Admitting: Family Medicine

## 2018-07-17 LAB — CUP PACEART REMOTE DEVICE CHECK
Battery Remaining Longevity: 143 mo
Battery Remaining Percentage: 95.5 %
Battery Voltage: 2.96 V
Brady Statistic RV Percent Paced: 99 %
Implantable Lead Location: 753860
Implantable Lead Model: 5076
Lead Channel Sensing Intrinsic Amplitude: 12 mV
Lead Channel Setting Pacing Amplitude: 1 V
Lead Channel Setting Pacing Pulse Width: 0.4 ms
MDC IDC LEAD IMPLANT DT: 20020418
MDC IDC MSMT LEADCHNL RV IMPEDANCE VALUE: 440 Ohm
MDC IDC MSMT LEADCHNL RV PACING THRESHOLD AMPLITUDE: 0.75 V
MDC IDC MSMT LEADCHNL RV PACING THRESHOLD PULSEWIDTH: 0.4 ms
MDC IDC PG IMPLANT DT: 20131226
MDC IDC PG SERIAL: 7331332
MDC IDC SESS DTM: 20191017221054
MDC IDC SET LEADCHNL RV SENSING SENSITIVITY: 4 mV
Pulse Gen Model: 1110

## 2018-07-20 ENCOUNTER — Other Ambulatory Visit: Payer: Self-pay | Admitting: Family Medicine

## 2018-07-20 DIAGNOSIS — N390 Urinary tract infection, site not specified: Secondary | ICD-10-CM | POA: Diagnosis not present

## 2018-07-20 DIAGNOSIS — R300 Dysuria: Secondary | ICD-10-CM | POA: Diagnosis not present

## 2018-07-30 ENCOUNTER — Encounter: Payer: Self-pay | Admitting: *Deleted

## 2018-08-06 ENCOUNTER — Encounter: Payer: Self-pay | Admitting: Family Medicine

## 2018-08-06 ENCOUNTER — Ambulatory Visit (INDEPENDENT_AMBULATORY_CARE_PROVIDER_SITE_OTHER): Payer: Medicare Other | Admitting: Family Medicine

## 2018-08-06 VITALS — BP 112/69 | HR 69 | Temp 97.4°F | Ht 66.0 in | Wt 145.0 lb

## 2018-08-06 DIAGNOSIS — E7800 Pure hypercholesterolemia, unspecified: Secondary | ICD-10-CM

## 2018-08-06 DIAGNOSIS — I4821 Permanent atrial fibrillation: Secondary | ICD-10-CM | POA: Diagnosis not present

## 2018-08-06 DIAGNOSIS — I10 Essential (primary) hypertension: Secondary | ICD-10-CM | POA: Diagnosis not present

## 2018-08-06 DIAGNOSIS — Z7901 Long term (current) use of anticoagulants: Secondary | ICD-10-CM

## 2018-08-06 DIAGNOSIS — R7989 Other specified abnormal findings of blood chemistry: Secondary | ICD-10-CM

## 2018-08-06 DIAGNOSIS — I480 Paroxysmal atrial fibrillation: Secondary | ICD-10-CM

## 2018-08-06 DIAGNOSIS — D509 Iron deficiency anemia, unspecified: Secondary | ICD-10-CM

## 2018-08-06 DIAGNOSIS — E559 Vitamin D deficiency, unspecified: Secondary | ICD-10-CM

## 2018-08-06 LAB — COAGUCHEK XS/INR WAIVED
INR: 2 — ABNORMAL HIGH (ref 0.9–1.1)
Prothrombin Time: 24.5 s

## 2018-08-06 NOTE — Progress Notes (Signed)
Subjective:    Patient ID: [name], female    DOB: 07-13-1917, 85 y.o.   MRN: [id number]  HPI Pt here for follow up and management of chronic medical problems which includes hypertension and a fib. She is taking medication regularly.  Her biggest complaint is weakness and fatigue.  She comes with HER-2 caregivers today.  Her vital signs are stable her weight is stable.  She had a recent urinary tract infection and is having no symptoms currently.  We will asked him to bring a urine specimen back at their convenience.  Her INR today was 2.0 and she will continue with her current Coumadin treatment regimen.  She should have a pro time checked again in 4 weeks.  To the sitters came with the patient today.  She seems to be pleasant and stable in appearance.  Once you talk to her for a while and start kidding whether she starts smiling and gets more lighthearted.  She denies any chest pain pressure tightness or shortness of breath she denies any trouble with swallowing heartburn indigestion nausea vomiting diarrhea blood in the stool or black tarry bowel movements.  She is passing her water without problems.  She is pretty sedentary around the house and the 2 caregivers and the patient seem to get along well together.    Patient Active Problem List   Diagnosis Date Noted  . Iron deficiency anemia 06/01/2017  . Permanent atrial fibrillation 03/17/2017  . Abdominal aortic atherosclerosis ([place]) 05/29/2016  . Ventral hernia 12/08/2013  . Vitamin D deficiency 12/08/2013  . Breast cancer ([place]) 12/08/2013  . Long term current use of anticoagulant therapy 05/05/2013  . Shortness of breath 05/02/2013  . Restless leg syndrome 10/12/2010  . CAROTID BRUIT 08/21/2010  . MITRAL REGURGITATION 08/01/2009  . HYPERTENSION, BENIGN 08/17/2008  . AV BLOCK, COMPLETE 08/17/2008  . ATRIAL FIBRILLATION 08/17/2008  . PACEMAKER, PERMANENT 08/17/2008   Outpatient Encounter Medications as of 08/06/2018    Medication Sig  . b complex vitamins tablet Take 1 tablet by mouth daily.   . busPIRone (BUSPAR) 5 MG tablet Take 1 tablet (5 mg total) by mouth daily as needed.  . Cholecalciferol (VITAMIN D3) 1000 UNITS tablet Take 2,000 Units by mouth daily.    . Fe Fum-FePoly-Vit C-Vit B3 (INTEGRA) 62.5-62.5-40-3 MG CAPS TAKE ONE CAPSULE BY MOUTH EVERY DAILY  . fish oil-omega-3 fatty acids 1000 MG capsule Take 2 g by mouth daily.  . furosemide (LASIX) 20 MG tablet Take 1 tablet (20 mg total) by mouth daily as needed.  . lactose free nutrition (BOOST) LIQD Take 237 mLs by mouth daily.  . polyethylene glycol powder (GLYCOLAX/MIRALAX) powder TAKE 17 GRAMS (1 CAPFUL) IN 8 OZ OF FLUID TWICE DAILY  . vitamin C (ASCORBIC ACID) 500 MG tablet Take 500 mg by mouth daily.    . vitamin E 400 UNIT capsule Take 400 Units by mouth daily.  Marland Kitchen warfarin (COUMADIN) 5 MG tablet TAKE 1/2 OR 1 TABLET BY MOUTH ONCE A DAY OR AS INSTRUCTED BY COUMADIN ANTICOAGULATION CLINIC   No facility-administered encounter medications on file as of 08/06/2018.      Review of Systems  Constitutional: Negative.   HENT: Negative.   Eyes: Negative.   Respiratory: Negative.   Cardiovascular: Negative.   Gastrointestinal: Negative.   Endocrine: Negative.   Genitourinary: Negative.   Musculoskeletal: Negative.   Skin: Negative.   Allergic/Immunologic: Negative.   Neurological: Positive for weakness.  Hematological: Negative.   Psychiatric/Behavioral: Negative.  Objective:   Physical Exam Vitals signs and nursing note reviewed.  Constitutional:      Appearance: Normal appearance. She is well-developed and normal weight. She is not ill-appearing or diaphoretic.     Comments: The patient is quiet thin and elderly but responds appropriately to questions asked of her.  She just turned 85 years old on December 30.  Examined in wheelchair.  HENT:     Head: Normocephalic and atraumatic.     Right Ear: Tympanic membrane, ear canal and  external ear normal. There is no impacted cerumen.     Left Ear: Tympanic membrane, ear canal and external ear normal. There is no impacted cerumen.     Nose: Nose normal. No congestion.     Mouth/Throat:     Mouth: Mucous membranes are moist.     Pharynx: Oropharynx is clear. No oropharyngeal exudate or posterior oropharyngeal erythema.  Eyes:     General: No scleral icterus.       Right eye: No discharge.        Left eye: No discharge.     Extraocular Movements: Extraocular movements intact.     Conjunctiva/sclera: Conjunctivae normal.     Pupils: Pupils are equal, round, and reactive to light.  Neck:     Musculoskeletal: Normal range of motion and neck supple.     Thyroid: No thyromegaly.     Vascular: No JVD.  Cardiovascular:     Rate and Rhythm: Normal rate and regular rhythm.     Heart sounds: Normal heart sounds. No murmur. No gallop.      Comments: The heart was regular today at 72/min. Pulmonary:     Effort: Pulmonary effort is normal.     Breath sounds: Normal breath sounds. No wheezing or rales.  Abdominal:     General: Bowel sounds are normal.     Palpations: Abdomen is soft. There is no mass.     Tenderness: There is no abdominal tenderness. There is no guarding.  Musculoskeletal:        General: No tenderness.     Right lower leg: No edema.     Left lower leg: No edema.     Comments: Patient in wheelchair today but ambulates in home with walker and cane and with assistance  Lymphadenopathy:     Cervical: No cervical adenopathy.  Skin:    General: Skin is warm and dry.     Coloration: Skin is pale.  Neurological:     General: No focal deficit present.     Mental Status: She is alert and oriented to person, place, and time.     Cranial Nerves: No cranial nerve deficit.     Motor: No weakness.     Deep Tendon Reflexes: Reflexes are normal and symmetric.  Psychiatric:        Mood and Affect: Mood normal.        Behavior: Behavior normal.        Thought  Content: Thought content normal.        Judgment: Judgment normal.     Comments: Mood affect and behavior are stable for this patient.  She was smiling before she left the office.     BP 112/69 (BP Location: Left Arm)   Pulse 69   Temp (!) 97.4 F (36.3 C) (Oral)   Ht 5' 6"  (1.676 m)   Wt 145 lb (65.8 kg)   BMI 23.40 kg/m        Assessment & Plan:  1. Vitamin D deficiency -Continue with vitamin D replacement pending results of lab work - CBC with Differential/Platelet - VITAMIN D 25 Hydroxy (Vit-D Deficiency, Fractures)  2. HYPERTENSION, BENIGN -Blood pressure is good today and she will continue with current treatment - BMP8+EGFR - CBC with Differential/Platelet - Hepatic function panel  3. Pure hypercholesterolemia -Continue with omega-3 fatty acids and therapeutic lifestyle changes and good eating habits - CBC with Differential/Platelet - Lipid panel  4. Paroxysmal atrial fibrillation (HCC) -The patient was in normal sinus rhythm today at 72/min - CBC with Differential/Platelet - CoaguChek XS/INR Waived  5. Iron deficiency anemia, unspecified iron deficiency anemia type -She should continue iron pending results of lab work - CBC with Differential/Platelet  6. Elevated TSH - CBC with Differential/Platelet  No orders of the defined types were placed in this encounter.  Patient Instructions                       Medicare Annual Wellness Visit  [facility] and the medical providers at [facility] strive to bring you the best medical care.  In doing so we not only want to address your current medical conditions and concerns but also to detect new conditions early and prevent illness, disease and health-related problems.    Medicare offers a yearly Wellness Visit which allows our clinical staff to assess your need for preventative services including immunizations, lifestyle education, counseling to decrease risk of preventable diseases and  screening for fall risk and other medical concerns.    This visit is provided free of charge (no copay) for all Medicare recipients. The clinical pharmacists at [facility] have begun to conduct these Wellness Visits which will also include a thorough review of all your medications.    As you primary medical provider recommend that you make an appointment for your Annual Wellness Visit if you have not done so already this year.  You may set up this appointment before you leave today or you may call back ([phone]) and schedule an appointment.  Please make sure when you call that you mention that you are scheduling your Annual Wellness Visit with the clinical pharmacist so that the appointment may be made for the proper length of time.     Continue current medications. Continue good therapeutic lifestyle changes which include good diet and exercise. Fall precautions discussed with patient. If an FOBT was given today- please return it to our front desk. If you are over 26 years old - you may need Prevnar 31 or the adult Pneumonia vaccine.  **Flu shots are available--- please call and schedule a FLU-CLINIC appointment**  After your visit with [place] today you will receive a survey in the mail or online from [employer] regarding your care with [place]. Please take a moment to fill this out. Your feedback is very important to [place] as you can help [place] better understand your patient needs as well as improve your experience and satisfaction. WE CARE ABOUT YOU!!!  Continue to be careful and do not put yourself at risk for falling Continue current treatment We will work on getting home monitor in place so patient can check INR is at home. She should continue to drink plenty of water and use every possible effort to keep the brain stimulated through reading television and interaction with other people  Arrie Senate MD

## 2018-08-06 NOTE — Patient Instructions (Addendum)
Medicare Annual Wellness Visit  [facility] [facility] and the medical providers at [facility] strive to bring you the best medical care.  In doing so we not only want to address your current medical conditions and concerns but also to detect new conditions early and prevent illness, disease and health-related problems.    Medicare offers a yearly Wellness Visit which allows our clinical staff to assess your need for preventative services including immunizations, lifestyle education, counseling to decrease risk of preventable diseases and screening for fall risk and other medical concerns.    This visit is provided free of charge (no copay) for all Medicare recipients. The clinical pharmacists at [facility] have begun to conduct these Wellness Visits which will also include a thorough review of all your medications.    As you primary medical provider recommend that you make an appointment for your Annual Wellness Visit if you have not done so already this year.  You may set up this appointment before you leave today or you may call back ([phone]) and schedule an appointment.  Please make sure when you call that you mention that you are scheduling your Annual Wellness Visit with the clinical pharmacist so that the appointment may be made for the proper length of time.     Continue current medications. Continue good therapeutic lifestyle changes which include good diet and exercise. Fall precautions discussed with patient. If an FOBT was given today- please return it to our front desk. If you are over 19 years old - you may need Prevnar 60 or the adult Pneumonia vaccine.  **Flu shots are available--- please call and schedule a FLU-CLINIC appointment**  After your visit with [place] today you will receive a survey in the mail or online from [employer] regarding your care with [place]. Please take a moment to fill this out. Your feedback is very  important to [place] as you can help [place] better understand your patient needs as well as improve your experience and satisfaction. WE CARE ABOUT YOU!!!  Continue to be careful and do not put yourself at risk for falling Continue current treatment We will work on getting home monitor in place so patient can check INR is at home. She should continue to drink plenty of water and use every possible effort to keep the brain stimulated through reading television and interaction with other people   Description   Continue taking 1/2 tablet on Wednesdays and Saturdays and 1 tablet all other days of the week.  INR today is 2.0  Goal is 2.0-3.0

## 2018-08-07 LAB — CBC WITH DIFFERENTIAL/PLATELET
BASOS: 1 %
Basophils Absolute: 0.1 10*3/uL (ref 0.0–0.2)
EOS (ABSOLUTE): 0.2 10*3/uL (ref 0.0–0.4)
EOS: 3 %
HEMATOCRIT: 36.3 % (ref 34.0–46.6)
Hemoglobin: 11.9 g/dL (ref 11.1–15.9)
Immature Grans (Abs): 0 10*3/uL (ref 0.0–0.1)
Immature Granulocytes: 0 %
Lymphocytes Absolute: 1.6 10*3/uL (ref 0.7–3.1)
Lymphs: 30 %
MCH: 29.8 pg (ref 26.6–33.0)
MCHC: 32.8 g/dL (ref 31.5–35.7)
MCV: 91 fL (ref 79–97)
MONOS ABS: 0.7 10*3/uL (ref 0.1–0.9)
Monocytes: 12 %
Neutrophils Absolute: 3 10*3/uL (ref 1.4–7.0)
Neutrophils: 54 %
Platelets: 233 10*3/uL (ref 150–450)
RBC: 4 x10E6/uL (ref 3.77–5.28)
RDW: 12.2 % (ref 11.7–15.4)
WBC: 5.5 10*3/uL (ref 3.4–10.8)

## 2018-08-07 LAB — HEPATIC FUNCTION PANEL
ALBUMIN: 4.5 g/dL (ref 3.2–4.6)
ALT: 12 IU/L (ref 0–32)
AST: 17 IU/L (ref 0–40)
Alkaline Phosphatase: 84 IU/L (ref 39–117)
BILIRUBIN TOTAL: 0.7 mg/dL (ref 0.0–1.2)
Bilirubin, Direct: 0.22 mg/dL (ref 0.00–0.40)
Total Protein: 6.8 g/dL (ref 6.0–8.5)

## 2018-08-07 LAB — BMP8+EGFR
BUN/Creatinine Ratio: 25 (ref 12–28)
BUN: 21 mg/dL (ref 10–36)
CO2: 22 mmol/L (ref 20–29)
CREATININE: 0.84 mg/dL (ref 0.57–1.00)
Calcium: 9.8 mg/dL (ref 8.7–10.3)
Chloride: 97 mmol/L (ref 96–106)
GFR calc Af Amer: 66 mL/min/{1.73_m2} (ref >59)
GFR, EST NON AFRICAN AMERICAN: 58 mL/min/{1.73_m2} — AB (ref >59)
Glucose: 157 mg/dL — ABNORMAL HIGH (ref 65–99)
POTASSIUM: 4.5 mmol/L (ref 3.5–5.2)
SODIUM: 137 mmol/L (ref 134–144)

## 2018-08-07 LAB — LIPID PANEL
CHOL/HDL RATIO: 3.8 ratio (ref 0.0–4.4)
Cholesterol, Total: 220 mg/dL — ABNORMAL HIGH (ref 100–199)
HDL: 58 mg/dL (ref >39)
LDL Calculated: 133 mg/dL — ABNORMAL HIGH (ref 0–99)
Triglycerides: 147 mg/dL (ref 0–149)
VLDL Cholesterol Cal: 29 mg/dL (ref 5–40)

## 2018-08-07 LAB — VITAMIN D 25 HYDROXY (VIT D DEFICIENCY, FRACTURES): VIT D 25 HYDROXY: 49.2 ng/mL (ref 30.0–100.0)

## 2018-08-10 ENCOUNTER — Telehealth: Payer: Self-pay | Admitting: Family Medicine

## 2018-08-10 MED ORDER — AMOXICILLIN 250 MG PO CAPS: 250.0000 mg | ORAL_CAPSULE | Freq: Three times a day (TID) | ORAL | 0 refills | Status: DC

## 2018-08-10 NOTE — Telephone Encounter (Signed)
If not allergic to penicillin, call in amoxicillin 250 mg 1 3 times a day for 10 days until completed

## 2018-08-10 NOTE — Telephone Encounter (Signed)
Med sent in - family aware

## 2018-08-13 ENCOUNTER — Encounter: Payer: Self-pay | Admitting: Cardiology

## 2018-08-16 ENCOUNTER — Telehealth (INDEPENDENT_AMBULATORY_CARE_PROVIDER_SITE_OTHER): Payer: Self-pay | Admitting: *Deleted

## 2018-08-16 DIAGNOSIS — I4821 Permanent atrial fibrillation: Secondary | ICD-10-CM

## 2018-08-16 DIAGNOSIS — Z7901 Long term (current) use of anticoagulants: Secondary | ICD-10-CM

## 2018-08-16 NOTE — Telephone Encounter (Signed)
Fax received mdINR PT/INR self testing service Test date/time 08/16/18 2:21 INR 2.3

## 2018-08-17 NOTE — Addendum Note (Signed)
Addended by: Chevis Pretty on: 08/17/2018 07:54 AM   Modules accepted: Level of Service

## 2018-08-17 NOTE — Telephone Encounter (Signed)
Continue current coumadin dose and rcheck in 4 weeks.

## 2018-08-17 NOTE — Telephone Encounter (Signed)
Attempted to contact patient - NA °

## 2018-08-17 NOTE — Patient Instructions (Signed)
Continue current coumadin dose and check in 4 weeks

## 2018-08-18 ENCOUNTER — Encounter: Payer: Medicare Other | Admitting: Pharmacist Clinician (PhC)/ Clinical Pharmacy Specialist

## 2018-08-19 ENCOUNTER — Telehealth: Payer: Self-pay

## 2018-08-19 ENCOUNTER — Ambulatory Visit (INDEPENDENT_AMBULATORY_CARE_PROVIDER_SITE_OTHER): Payer: Medicare Other

## 2018-08-19 DIAGNOSIS — I442 Atrioventricular block, complete: Secondary | ICD-10-CM | POA: Diagnosis not present

## 2018-08-19 NOTE — Telephone Encounter (Signed)
Spoke with caregiver to remind of missed remote transmission

## 2018-08-20 ENCOUNTER — Encounter: Payer: Self-pay | Admitting: Cardiology

## 2018-08-20 ENCOUNTER — Other Ambulatory Visit: Payer: Self-pay | Admitting: Family Medicine

## 2018-08-20 NOTE — Telephone Encounter (Signed)
Patient's caregiver aware 

## 2018-08-20 NOTE — Progress Notes (Signed)
Remote pacemaker transmission.   

## 2018-08-22 LAB — CUP PACEART REMOTE DEVICE CHECK
Battery Remaining Longevity: 134 mo
Battery Remaining Percentage: 91 %
Battery Voltage: 2.95 V
Brady Statistic RV Percent Paced: 99 %
Date Time Interrogation Session: 20200123170333
Implantable Lead Location: 753860
Implantable Lead Model: 5076
Lead Channel Impedance Value: 450 Ohm
Lead Channel Pacing Threshold Pulse Width: 0.4 ms
Lead Channel Sensing Intrinsic Amplitude: 12 mV
Lead Channel Setting Pacing Amplitude: 1 V
Lead Channel Setting Pacing Pulse Width: 0.4 ms
Lead Channel Setting Sensing Sensitivity: 4 mV
MDC IDC LEAD IMPLANT DT: 20020418
MDC IDC MSMT LEADCHNL RV PACING THRESHOLD AMPLITUDE: 0.75 V
MDC IDC PG IMPLANT DT: 20131226
Pulse Gen Model: 1110
Pulse Gen Serial Number: 7331332

## 2018-08-24 ENCOUNTER — Telehealth: Payer: Self-pay | Admitting: *Deleted

## 2018-08-24 DIAGNOSIS — Z7901 Long term (current) use of anticoagulants: Secondary | ICD-10-CM

## 2018-08-24 DIAGNOSIS — I4821 Permanent atrial fibrillation: Secondary | ICD-10-CM

## 2018-08-24 NOTE — Telephone Encounter (Signed)
Fax received mdINR PT/INR self testing service Test date/time 08/23/18 5:02 INR 2.6

## 2018-08-24 NOTE — Telephone Encounter (Signed)
Continue current treatment regimen and repeat in 1 week

## 2018-08-24 NOTE — Telephone Encounter (Signed)
Caregiver called and aware

## 2018-08-31 ENCOUNTER — Telehealth: Payer: Self-pay | Admitting: *Deleted

## 2018-08-31 DIAGNOSIS — I4821 Permanent atrial fibrillation: Secondary | ICD-10-CM

## 2018-08-31 DIAGNOSIS — Z7901 Long term (current) use of anticoagulants: Secondary | ICD-10-CM

## 2018-08-31 NOTE — Telephone Encounter (Signed)
Fax received mdINR PT/INR self testing service Test date/time 08/30/18 5:04 pm INR 2.6

## 2018-08-31 NOTE — Telephone Encounter (Signed)
Continue coumadin 5mg  daily except 2.5mg  on wed and sat

## 2018-08-31 NOTE — Telephone Encounter (Signed)
Pt aware to continue current dosing 

## 2018-09-01 ENCOUNTER — Encounter: Payer: Medicare Other | Admitting: Pharmacist Clinician (PhC)/ Clinical Pharmacy Specialist

## 2018-09-02 ENCOUNTER — Ambulatory Visit: Payer: Medicare Other | Admitting: Family Medicine

## 2018-09-06 DIAGNOSIS — I4821 Permanent atrial fibrillation: Secondary | ICD-10-CM | POA: Diagnosis not present

## 2018-09-07 ENCOUNTER — Telehealth: Payer: Self-pay | Admitting: *Deleted

## 2018-09-07 DIAGNOSIS — Z7901 Long term (current) use of anticoagulants: Secondary | ICD-10-CM

## 2018-09-07 DIAGNOSIS — I4821 Permanent atrial fibrillation: Secondary | ICD-10-CM

## 2018-09-07 NOTE — Telephone Encounter (Signed)
Continue current treatment with Coumadin and repeat INR in 1 week

## 2018-09-07 NOTE — Telephone Encounter (Signed)
Aware to continue current dose & repeat in 1 wk

## 2018-09-07 NOTE — Telephone Encounter (Signed)
Fax received mdINR PT/INR self testing service Test date/time 09/06/18 5:05 am  INR 2.4

## 2018-09-14 ENCOUNTER — Telehealth: Payer: Self-pay | Admitting: *Deleted

## 2018-09-14 DIAGNOSIS — Z7901 Long term (current) use of anticoagulants: Secondary | ICD-10-CM

## 2018-09-14 DIAGNOSIS — I4821 Permanent atrial fibrillation: Secondary | ICD-10-CM

## 2018-09-14 NOTE — Telephone Encounter (Signed)
Aware to continue current dose & recheck in 4 weeks

## 2018-09-14 NOTE — Telephone Encounter (Signed)
Fax received mdINR PT/INR self testing service Test date/time 09/13/18 5:29 p INR 2.2

## 2018-09-14 NOTE — Telephone Encounter (Signed)
continue current coumadin dose and recheck in 4 weeks

## 2018-09-22 ENCOUNTER — Telehealth: Payer: Self-pay | Admitting: *Deleted

## 2018-09-22 DIAGNOSIS — I4821 Permanent atrial fibrillation: Secondary | ICD-10-CM

## 2018-09-22 DIAGNOSIS — Z7901 Long term (current) use of anticoagulants: Secondary | ICD-10-CM

## 2018-09-22 NOTE — Telephone Encounter (Signed)
The INR is good.  Continue checking pro time on a regular basis for stability purposes once weekly.

## 2018-09-22 NOTE — Telephone Encounter (Signed)
Fax received mdINR PT/INR self testing service Test date/time 09/22/18 9:31 am INR 2.4

## 2018-09-29 ENCOUNTER — Telehealth: Payer: Self-pay | Admitting: *Deleted

## 2018-09-29 DIAGNOSIS — I4821 Permanent atrial fibrillation: Secondary | ICD-10-CM

## 2018-09-29 DIAGNOSIS — Z7901 Long term (current) use of anticoagulants: Secondary | ICD-10-CM

## 2018-09-29 NOTE — Telephone Encounter (Signed)
Aware. 

## 2018-09-29 NOTE — Telephone Encounter (Signed)
Fax received mdINR PT/INR self testing service Test date/time 09/28/18 4:44 pm INR 2.8

## 2018-09-29 NOTE — Telephone Encounter (Signed)
Continue with current Coumadin treatment and recheck in 1 week

## 2018-10-04 DIAGNOSIS — I4821 Permanent atrial fibrillation: Secondary | ICD-10-CM | POA: Diagnosis not present

## 2018-10-05 ENCOUNTER — Telehealth: Payer: Self-pay | Admitting: Family Medicine

## 2018-10-05 ENCOUNTER — Telehealth: Payer: Self-pay | Admitting: *Deleted

## 2018-10-05 DIAGNOSIS — I4821 Permanent atrial fibrillation: Secondary | ICD-10-CM

## 2018-10-05 DIAGNOSIS — Z7901 Long term (current) use of anticoagulants: Secondary | ICD-10-CM

## 2018-10-05 NOTE — Telephone Encounter (Signed)
Confirm current treatment.  Once that is confirmed we should probably increase her medicine x1 day and then resume previous dosing.  Recheck INR 1 week

## 2018-10-05 NOTE — Telephone Encounter (Signed)
This note has been attached to INR note and Roselyn Reef is calling the caregiver

## 2018-10-05 NOTE — Telephone Encounter (Signed)
Caregiver aware - will check tomorrow

## 2018-10-05 NOTE — Telephone Encounter (Signed)
Do not increase Coumadin if mouth is bleeding.  Check another INR tomorrow and call us with that result and if mouth is still bleeding or not.  Continue to use cold drinks and solutions and mouth.

## 2018-10-05 NOTE — Telephone Encounter (Signed)
Spoke with caregiver = mouth bleeding - roof of mouth is bleeding some = no know injury / reason. Noticed yesterday - got better, and now started again - not too bad, using a cold cloth to compress and advised to have her suck on some ice.    Will instructions change with coumadin dose now with the bleeding?  Current dose: Takes whole 5 mg daily  Except Wed and SAT is 2.5 mg

## 2018-10-05 NOTE — Telephone Encounter (Signed)
Fax received mdINR PT/INR self testing service Test date/time 10/04/18 7:20 pm INR 1.5

## 2018-10-06 ENCOUNTER — Encounter (HOSPITAL_COMMUNITY): Payer: Self-pay | Admitting: Emergency Medicine

## 2018-10-06 ENCOUNTER — Emergency Department (HOSPITAL_COMMUNITY)
Admission: EM | Admit: 2018-10-06 | Discharge: 2018-10-06 | Disposition: A | Discharge status: Home / Self Care | Payer: Medicare Other | Attending: Emergency Medicine | Admitting: Emergency Medicine

## 2018-10-06 ENCOUNTER — Other Ambulatory Visit: Payer: Self-pay

## 2018-10-06 DIAGNOSIS — K068 Other specified disorders of gingiva and edentulous alveolar ridge: Secondary | ICD-10-CM | POA: Insufficient documentation

## 2018-10-06 DIAGNOSIS — Z96641 Presence of right artificial hip joint: Secondary | ICD-10-CM | POA: Diagnosis not present

## 2018-10-06 DIAGNOSIS — Z7901 Long term (current) use of anticoagulants: Secondary | ICD-10-CM | POA: Insufficient documentation

## 2018-10-06 DIAGNOSIS — Z79899 Other long term (current) drug therapy: Secondary | ICD-10-CM | POA: Diagnosis not present

## 2018-10-06 DIAGNOSIS — Z950 Presence of cardiac pacemaker: Secondary | ICD-10-CM | POA: Diagnosis not present

## 2018-10-06 DIAGNOSIS — K1379 Other lesions of oral mucosa: Secondary | ICD-10-CM

## 2018-10-06 DIAGNOSIS — K029 Dental caries, unspecified: Secondary | ICD-10-CM | POA: Diagnosis not present

## 2018-10-06 LAB — CBC WITH DIFFERENTIAL/PLATELET
Abs Immature Granulocytes: 0.02 10*3/uL (ref 0.00–0.07)
Basophils Absolute: 0.1 10*3/uL (ref 0.0–0.1)
Basophils Relative: 1 %
Eosinophils Absolute: 0.2 10*3/uL (ref 0.0–0.5)
Eosinophils Relative: 2 %
HCT: 36.6 % (ref 36.0–46.0)
Hemoglobin: 11.9 g/dL — ABNORMAL LOW (ref 12.0–15.0)
Immature Granulocytes: 0 %
LYMPHS ABS: 1.9 10*3/uL (ref 0.7–4.0)
Lymphocytes Relative: 27 %
MCH: 29.6 pg (ref 26.0–34.0)
MCHC: 32.5 g/dL (ref 30.0–36.0)
MCV: 91 fL (ref 80.0–100.0)
MONOS PCT: 13 %
Monocytes Absolute: 0.9 10*3/uL (ref 0.1–1.0)
Neutro Abs: 4 10*3/uL (ref 1.7–7.7)
Neutrophils Relative %: 57 %
Platelets: 225 10*3/uL (ref 150–400)
RBC: 4.02 MIL/uL (ref 3.87–5.11)
RDW: 13.3 % (ref 11.5–15.5)
WBC: 7 10*3/uL (ref 4.0–10.5)
nRBC: 0 % (ref 0.0–0.2)

## 2018-10-06 LAB — COMPREHENSIVE METABOLIC PANEL
ALK PHOS: 62 U/L (ref 38–126)
ALT: 13 U/L (ref 0–44)
AST: 21 U/L (ref 15–41)
Albumin: 4.1 g/dL (ref 3.5–5.0)
Anion gap: 8 (ref 5–15)
BUN: 30 mg/dL — ABNORMAL HIGH (ref 8–23)
CALCIUM: 9.8 mg/dL (ref 8.9–10.3)
CO2: 29 mmol/L (ref 22–32)
Chloride: 99 mmol/L (ref 98–111)
Creatinine, Ser: 0.62 mg/dL (ref 0.44–1.00)
GFR calc Af Amer: >60 mL/min (ref >60)
GFR calc non Af Amer: >60 mL/min (ref >60)
Glucose, Bld: 101 mg/dL — ABNORMAL HIGH (ref 70–99)
Potassium: 4.1 mmol/L (ref 3.5–5.1)
Sodium: 136 mmol/L (ref 135–145)
TOTAL PROTEIN: 7.4 g/dL (ref 6.5–8.1)
Total Bilirubin: 0.7 mg/dL (ref 0.3–1.2)

## 2018-10-06 LAB — PROTIME-INR
INR: 2.5 — ABNORMAL HIGH (ref 0.8–1.2)
Prothrombin Time: 26.5 seconds — ABNORMAL HIGH (ref 11.4–15.2)

## 2018-10-06 MED ORDER — SODIUM CHLORIDE 0.9 % IV SOLN
1000.0000 mL | INTRAVENOUS | Status: DC
  Administered 2018-10-06: 1000 mL via INTRAVENOUS

## 2018-10-06 MED ORDER — SODIUM CHLORIDE 0.9 % IV BOLUS (SEPSIS)
500.0000 mL | Freq: Once | INTRAVENOUS | Status: AC
  Administered 2018-10-06: 500 mL via INTRAVENOUS

## 2018-10-06 NOTE — Telephone Encounter (Signed)
Please call and check with caregiver possibly her grandson Roman and see if the patient's time is still bleeding.  She will need a repeat pro time either today or tomorrow.

## 2018-10-06 NOTE — ED Notes (Signed)
Clot noted to left upper gum area. Not actively bleeding

## 2018-10-06 NOTE — ED Triage Notes (Signed)
Family reports pt has had bleeding intermittently since Monday morning, they are unsure the source of the bleeding, during triage minimal bleeding noted to left jaw tooth, family reports pt takes blood thinners

## 2018-10-06 NOTE — ED Notes (Signed)
Tea bags applied to pt's mouth.

## 2018-10-06 NOTE — Telephone Encounter (Signed)
They will repeat protime today

## 2018-10-06 NOTE — ED Provider Notes (Signed)
[facility] EMERGENCY DEPARTMENT Provider Note   CSN: [id number] Arrival date & time: 10/06/18  0900    History   Chief Complaint Chief Complaint  Patient presents with  . oral bleeding    HPI [name] is a 85 y.o. female.     Patient is a 85 year old female who presents to the emergency department with a complaint of oral bleeding. The family reports that the patient has had some mild intermittent bleeding since March 9.  It is of note that the patient is on blood thinners because of atrial fibrillation.  There is been no recent injury or trauma to the mouth.  No recent procedures.  There is been no unusual dizziness or blurred vision or unusual weakness.  No high fevers reported.  No pus like drainage reported.  No pain reported at this time.   The history is provided by a relative.    Past Medical History:  Diagnosis Date  . Atrial fibrillation ([place])   . Carotid bruit   . Cataract   . Colon disorder    Adhesions  . Heart failure, diastolic ([place])   . Hyperlipidemia    statin intolerant  . Hypertension   . Mitral regurgitation    Moderate  . Nephrolithiasis   . Ovarian cyst   . QT prolongation    With solatol  . Restless leg syndrome   . S/P AV nodal ablation     Patient Active Problem List   Diagnosis Date Noted  . Iron deficiency anemia 06/01/2017  . Permanent atrial fibrillation 03/17/2017  . Abdominal aortic atherosclerosis ([place]) 05/29/2016  . Ventral hernia 12/08/2013  . Vitamin D deficiency 12/08/2013  . Breast cancer ([place]) 12/08/2013  . Long term current use of anticoagulant therapy 05/05/2013  . Shortness of breath 05/02/2013  . Restless leg syndrome 10/12/2010  . CAROTID BRUIT 08/21/2010  . MITRAL REGURGITATION 08/01/2009  . HYPERTENSION, BENIGN 08/17/2008  . AV BLOCK, COMPLETE 08/17/2008  . ATRIAL FIBRILLATION 08/17/2008  . PACEMAKER, PERMANENT 08/17/2008    Past Surgical History:  Procedure Laterality Date  . ABDOMINAL ADHESION  SURGERY     twice  . BREAST SURGERY Left 2010   lumpectomy  . CATARACT EXTRACTION     Bilaterally  . COLON SURGERY     Colonic obstruction secondary to diverticulitis with resection  . CYSTECTOMY     Ovarian  . EYE SURGERY    . JOINT REPLACEMENT Right 2004   hip  . PACEMAKER INSERTION     [employer]  . PERMANENT PACEMAKER GENERATOR CHANGE N/A 07/22/2012   Procedure: PERMANENT PACEMAKER GENERATOR CHANGE;  Surgeon: Deboraha Sprang, MD;  Location: [facility] CATH LAB;  Service: Cardiovascular;  Laterality: N/A;     OB History    Gravida  1   Para  1   Term  1   Preterm      AB      Living  0     SAB      TAB      Ectopic      Multiple      Live Births               Home Medications    Prior to Admission medications   Medication Sig Start Date End Date Taking? Authorizing Provider  b complex vitamins tablet Take 1 tablet by mouth daily.    Yes [provider]  Cholecalciferol (VITAMIN D3) 1000 UNITS tablet Take 2,000 Units by mouth daily.  Yes [provider]  Fe Fum-FePoly-Vit C-Vit B3 (INTEGRA) 62.5-62.5-40-3 MG CAPS TAKE ONE CAPSULE BY MOUTH EVERY DAILY 08/23/18  Yes Chipper Herb, MD  furosemide (LASIX) 20 MG tablet Take 1 tablet (20 mg total) by mouth daily as needed. 11/29/14  Yes Cherre Robins, PharmD  ipratropium (ATROVENT) 0.06 % nasal spray Place 2 sprays into the nose 3 (three) times daily. 08/14/17  Yes [provider]  lactose free nutrition (BOOST) LIQD Take 237 mLs by mouth daily. 12/18/16  Yes Chipper Herb, MD  Melatonin 3 MG TABS Take 1 tablet by mouth at bedtime as needed.   Yes [provider]  polyethylene glycol powder (GLYCOLAX/MIRALAX) powder TAKE 17 GRAMS (1 CAPFUL) IN 8 OZ OF FLUID TWICE DAILY 07/14/18  Yes Chipper Herb, MD  vitamin C (ASCORBIC ACID) 500 MG tablet Take 500 mg by mouth daily.     Yes [provider]  vitamin E 400 UNIT capsule Take 400 Units by mouth daily.   Yes [provider]  warfarin (COUMADIN) 5 MG tablet TAKE 1/2 OR 1 TABLET BY MOUTH ONCE A DAY OR AS INSTRUCTED BY COUMADIN ANTICOAGULATION CLINIC Patient taking differently: Take 2.5-5 mg by mouth daily. TAKE 1/2 OR 1 TABLET BY MOUTH ONCE A DAY OR AS INSTRUCTED BY COUMADIN ANTICOAGULATION CLINIC; Patient takes 1/2 tablet on Saturday and Wednesday; every other day she takes 1 whole tablet 04/08/18  Yes Chipper Herb, MD    Family History Family History  Problem Relation Age of Onset  . Diabetes Mother        type 2  . Diabetes Father        type 2  . Cancer Sister 13       colon    Social History Social History   Tobacco Use  . Smoking status: Never Smoker  . Smokeless tobacco: Never Used  Substance Use Topics  . Alcohol use: No  . Drug use: No     Allergies   Atorvastatin; Boniva [ibandronate sodium]; Chocolate; Lovastatin; Pravastatin sodium; Risedronate sodium; Toprol xl [metoprolol succinate]; Visken [pindolol]; Welchol [colesevelam hcl]; and Vesicare [solifenacin]   Review of Systems Review of Systems  HENT: Positive for dental problem.      Physical Exam Updated Vital Signs BP 122/86   Pulse 73   Temp 97.7 F (36.5 C) (Oral)   Resp 16   Ht 5\' 7"  (1.702 m)   Wt 65.8 kg   SpO2 94%   BMI 22.71 kg/m   Physical Exam Vitals signs and nursing note reviewed.  Constitutional:      Appearance: She is well-developed. She is not toxic-appearing.  HENT:     Head: Normocephalic.     Right Ear: Tympanic membrane and external ear normal.     Left Ear: Tympanic membrane and external ear normal.     Mouth/Throat:      Comments: There is an area of slight bruising at the right lower jaw, and also the left upper jaw area.  Both of these areas are associated with decaying teeth.  No visible abscess appreciated.  No swelling under the tongue.  The airway is patent. Eyes:     General: Lids are normal.     Pupils: Pupils are equal, round, and reactive to light.  Neck:      Musculoskeletal: Normal range of motion and neck supple.     Vascular: No carotid bruit.  Cardiovascular:     Rate and Rhythm: Normal rate and  regular rhythm.     Pulses: Normal pulses.     Heart sounds: Murmur present. Systolic murmur present with a grade of 2/6.  Pulmonary:     Effort: No respiratory distress.     Breath sounds: Normal breath sounds.  Abdominal:     General: Bowel sounds are normal.     Palpations: Abdomen is soft.     Tenderness: There is no abdominal tenderness. There is no guarding.  Musculoskeletal: Normal range of motion.  Lymphadenopathy:     Head:     Right side of head: No submandibular adenopathy.     Left side of head: No submandibular adenopathy.     Cervical: No cervical adenopathy.  Skin:    General: Skin is warm and dry.  Neurological:     Mental Status: She is alert and oriented to person, place, and time.     Cranial Nerves: No cranial nerve deficit.     Sensory: No sensory deficit.  Psychiatric:        Speech: Speech normal.      ED Treatments / Results  Labs (all labs ordered are listed, but only abnormal results are displayed) Labs Reviewed  CBC WITH DIFFERENTIAL/PLATELET - Abnormal; Notable for the following components:      Result Value   Hemoglobin 11.9 (*)    All other components within normal limits  COMPREHENSIVE METABOLIC PANEL - Abnormal; Notable for the following components:   Glucose, Bld 101 (*)    BUN 30 (*)    All other components within normal limits  PROTIME-INR - Abnormal; Notable for the following components:   Prothrombin Time 26.5 (*)    INR 2.5 (*)    All other components within normal limits    EKG None  Radiology No results found.  Procedures Procedures (including critical care time)  Medications Ordered in ED Medications  sodium chloride 0.9 % bolus 500 mL (0 mLs Intravenous Stopped 10/06/18 1225)    Followed by  0.9 %  sodium chloride infusion (1,000 mLs Intravenous New Bag/Given 10/06/18 1155)      Initial Impression / Assessment and Plan / ED Course  I have reviewed the triage vital signs and the nursing notes.  Pertinent labs & imaging results that were available during my care of the patient were reviewed by me and considered in my medical decision making (see chart for details).          Final Clinical Impressions(s) / ED Diagnoses MDM  Vital signs reviewed.  Pulse oximetry is 94 to 96% on room air.  Within normal limits by my interpretation.  The patient denies active pain at this time.  Given the multiple dental caries and the bleeding.  It was felt that the patient should be evaluated with a complete blood count.  The hemoglobin hematocrit are within normal limits.  The white blood cell count is within normal limits. The comprehensive metabolic panel was negative for acute changes. The pro time-INR shows the pro time to be elevated at 26.5 seconds, the INR is 2.5, within therapeutic range.  I explained to the patient the need to stop the oozing of blood if possible.  The patient is in agreement with the with this ideology.  The patient was given teabags to use at both of the blood losing bleeding sites.  After about 45 minutes to an hour of observation here in the emergency department the bleeding has completely stopped.  The patient continues to deny pain.  Feel that it  is safe for the patient to be discharged home.  The patient is to see the dentist as soon as possible.  The family says that they have called the dentist and will get a preliminary visit once they leave the emergency department.   Final diagnoses:  Oral bleeding  Dental caries    ED Discharge Orders    None       Lily Kocher, Hershal Coria 10/09/18 2307    Virgel Manifold, MD 10/15/18 1214

## 2018-10-06 NOTE — Discharge Instructions (Addendum)
Your vital signs within normal limits.  Your blood work does not show evidence of any severe anemia or high degree infection.  Your lab work does show some mild dehydration.  You were given IV fluids today.  You had some bleeding from the left upper gum and the right lower gum and tooth area.  This seemed to have been improved by the application of tea bags.  Please continue to use the teabags at home if okay with the dentist.  Your INR is in the therapeutic range of 2.5.  It is important that she see a dentist as soon as possible.  Please let your primary physician know about your INR range.  Please return to the emergency department if any changes in your condition, problems, or concerns.

## 2018-10-12 ENCOUNTER — Telehealth: Payer: Self-pay | Admitting: *Deleted

## 2018-10-12 DIAGNOSIS — Z7901 Long term (current) use of anticoagulants: Secondary | ICD-10-CM

## 2018-10-12 DIAGNOSIS — I4821 Permanent atrial fibrillation: Secondary | ICD-10-CM

## 2018-10-12 NOTE — Telephone Encounter (Signed)
Fax received mdINR PT/INR self testing service Test date/time 10/11/18 5:33 pm INR 2.5

## 2018-10-12 NOTE — Telephone Encounter (Signed)
inr normal range continue current dose of coumadin 5mg  daily except 2.5mg  on wed and sat

## 2018-10-12 NOTE — Telephone Encounter (Signed)
Aware to continue current dosing. 

## 2018-10-18 IMAGING — DX DG LUMBAR SPINE 2-3V
2 series · 2 of 2 positions shown · non-contrast
Comparison: CT 07/11/2011.

CLINICAL DATA: Bilateral leg pain.

EXAM:
LUMBAR SPINE - 2-3 VIEW

[l-spine ap]
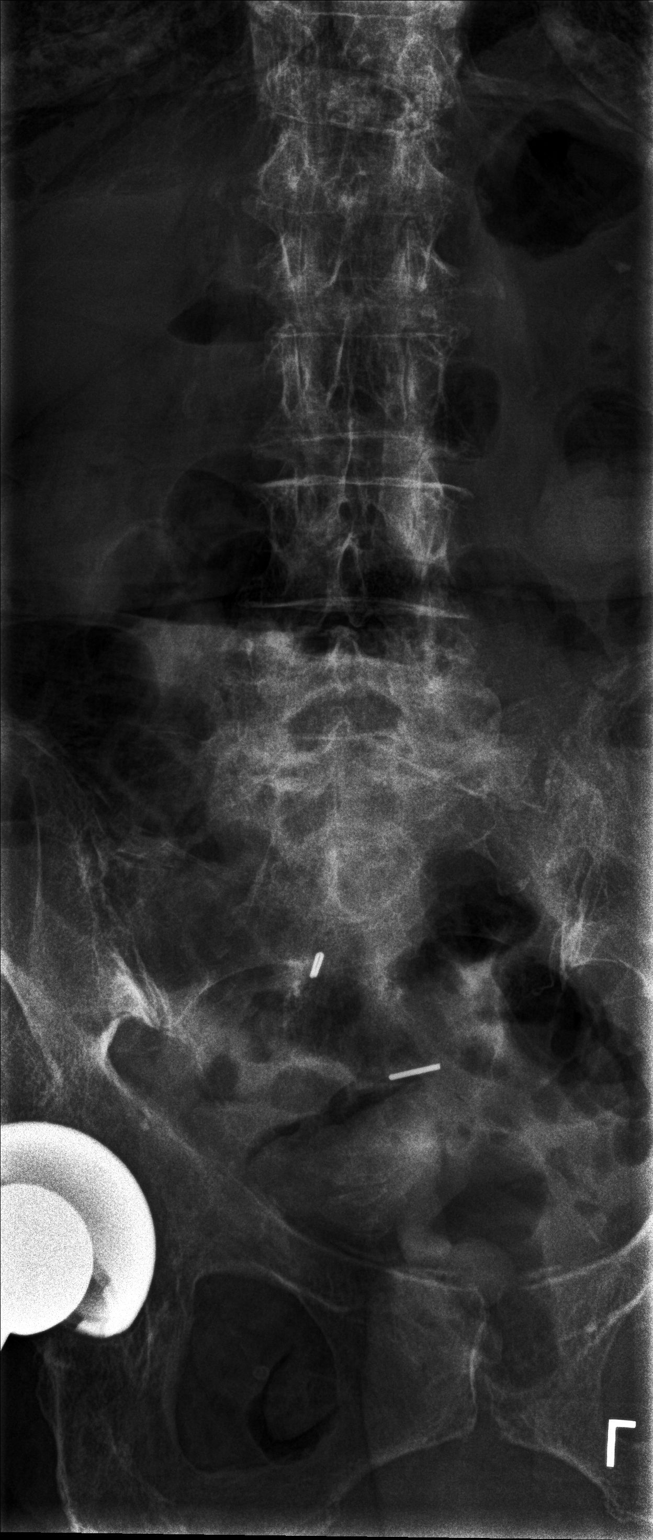

[l-spine lat]
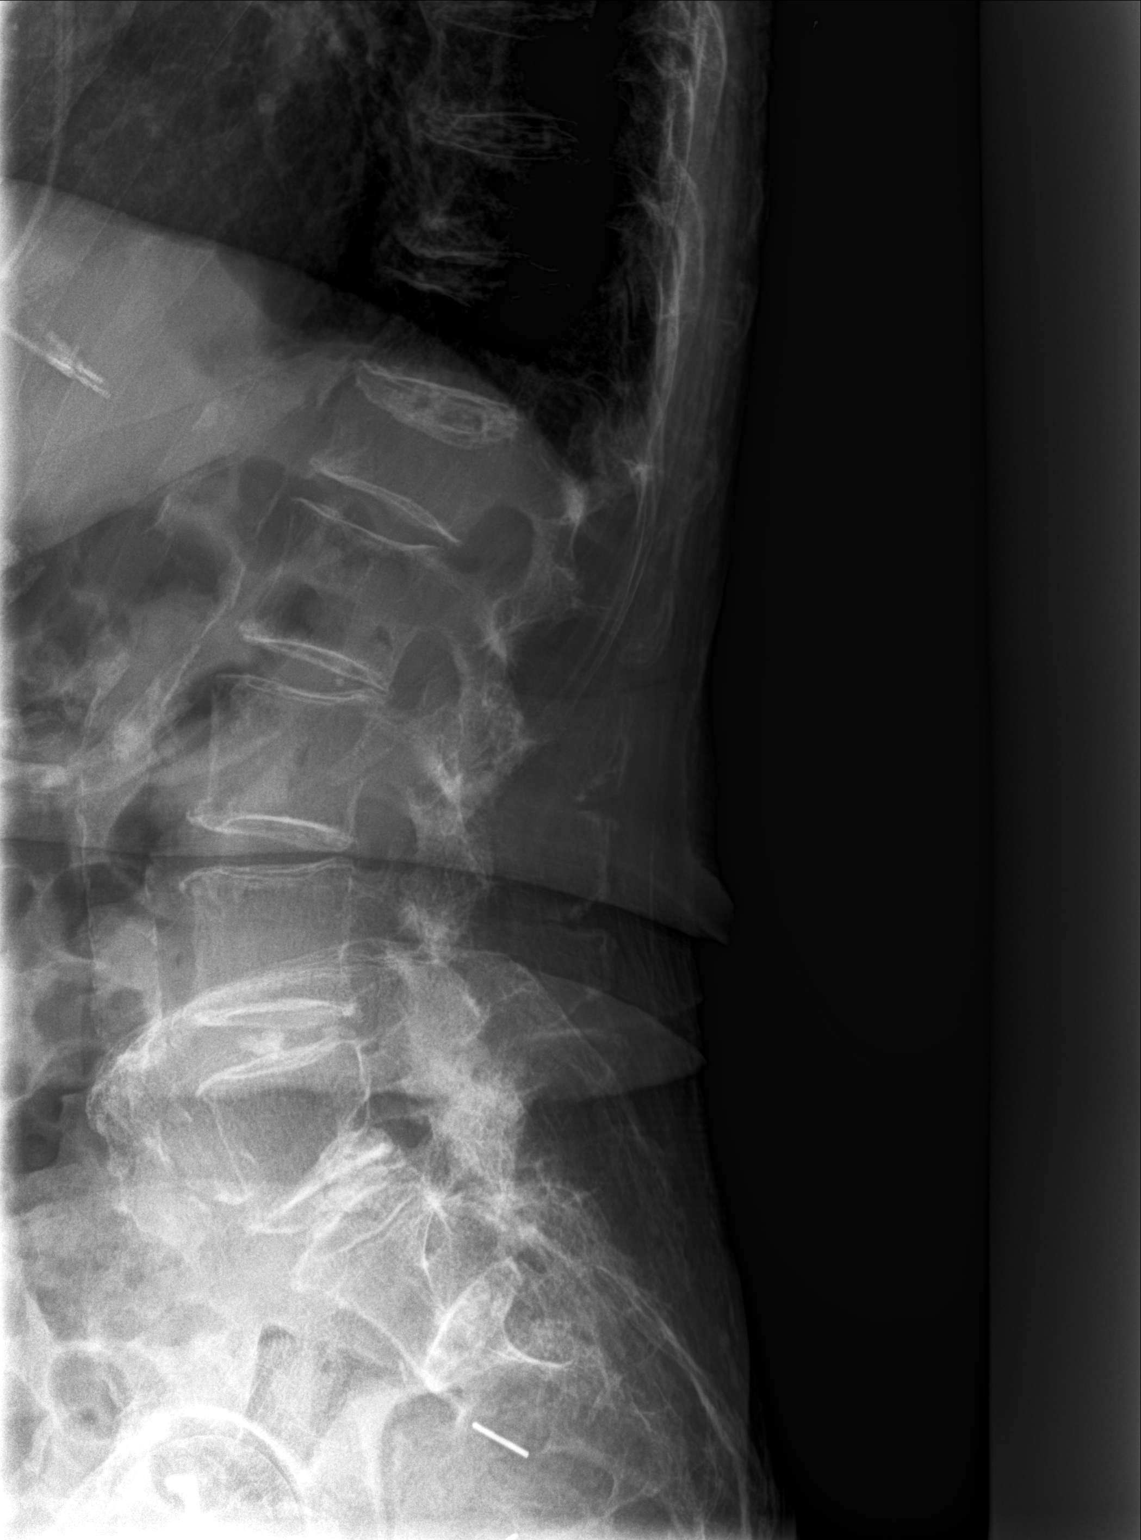

[2 of 2 positions shown; findings below may reference images not displayed]

FINDINGS: Mild T12 and L1 compression fractures, these are stable. Diffuse
osteopenia. Aortoiliac atherosclerotic vascular calcification. Right
hip replacement. Surgical clips are noted the pelvis.
IMPRESSION: 1. Mild T12 and L1 stable compression fractures. Diffuse osteopenia
and degenerative change.

2. Aortoiliac atherosclerotic vascular disease.

## 2018-10-27 ENCOUNTER — Telehealth: Payer: Self-pay

## 2018-10-27 NOTE — Telephone Encounter (Signed)
10/27/2018  INR  2.0  Normal 2-3

## 2018-10-27 NOTE — Telephone Encounter (Signed)
Continue current dose of Coumadin and recheck INR in 1 week

## 2018-10-27 NOTE — Telephone Encounter (Signed)
Caregiver aware.

## 2018-10-28 ENCOUNTER — Telehealth: Payer: Self-pay | Admitting: *Deleted

## 2018-10-28 DIAGNOSIS — Z7901 Long term (current) use of anticoagulants: Secondary | ICD-10-CM

## 2018-10-28 DIAGNOSIS — I4821 Permanent atrial fibrillation: Secondary | ICD-10-CM

## 2018-10-28 NOTE — Telephone Encounter (Signed)
Fax received mdINR PT/INR self testing service Test date/time 10/27/18 7:41 pm INR 2.0

## 2018-10-28 NOTE — Telephone Encounter (Signed)
Pt called yesterday and aware of normal reading and to continue same dose = reck in one week

## 2018-11-02 ENCOUNTER — Other Ambulatory Visit: Payer: Self-pay | Admitting: Family Medicine

## 2018-11-03 ENCOUNTER — Telehealth: Payer: Self-pay | Admitting: *Deleted

## 2018-11-03 ENCOUNTER — Other Ambulatory Visit: Payer: Self-pay | Admitting: *Deleted

## 2018-11-03 DIAGNOSIS — Z7901 Long term (current) use of anticoagulants: Secondary | ICD-10-CM

## 2018-11-03 DIAGNOSIS — I4821 Permanent atrial fibrillation: Secondary | ICD-10-CM

## 2018-11-03 MED ORDER — INTEGRA 62.5-62.5-40-3 MG PO CAPS: ORAL_CAPSULE | ORAL | 3 refills | Status: DC

## 2018-11-03 NOTE — Telephone Encounter (Signed)
Pt caregiver aware and states she understands.

## 2018-11-03 NOTE — Telephone Encounter (Signed)
Fax received mdINR PT/INR self testing service Test date/time 11/03/18 11:39 am INR 3.0

## 2018-11-03 NOTE — Telephone Encounter (Signed)
Currently taking 5 mg daily except 2.5 on MON and WED

## 2018-11-03 NOTE — Telephone Encounter (Signed)
Hold today's dose and change treatment regimen to 2-1/2 mg on Monday Wednesday and Friday and 5 mg on all other days and recheck pro time in 1 week

## 2018-11-03 NOTE — Telephone Encounter (Signed)
Error last note   Takes 5 mg daily and 2.5 on MON and SAT

## 2018-11-08 ENCOUNTER — Telehealth: Payer: Self-pay

## 2018-11-08 DIAGNOSIS — I4821 Permanent atrial fibrillation: Secondary | ICD-10-CM

## 2018-11-08 DIAGNOSIS — Z7901 Long term (current) use of anticoagulants: Secondary | ICD-10-CM

## 2018-11-08 NOTE — Telephone Encounter (Addendum)
Patient's caregiver notified of instructions from MMM.

## 2018-11-08 NOTE — Telephone Encounter (Signed)
Take 2 tablets tomorrow then Continue taking 1/2 tablet on Wednesdays and Saturdays and 1 tablet all other days of the week.  Recheck in 1 week

## 2018-11-08 NOTE — Telephone Encounter (Signed)
11/08/2018  INR 1.7  Range 2-3

## 2018-11-15 ENCOUNTER — Telehealth: Payer: Self-pay | Admitting: *Deleted

## 2018-11-15 DIAGNOSIS — I4821 Permanent atrial fibrillation: Secondary | ICD-10-CM

## 2018-11-15 DIAGNOSIS — Z7901 Long term (current) use of anticoagulants: Secondary | ICD-10-CM

## 2018-11-15 NOTE — Telephone Encounter (Signed)
Fax received mdINR PT/INR self testing service Test date/time 11/15/18 1:50 INR 2.1

## 2018-11-15 NOTE — Telephone Encounter (Signed)
Caregiver aware.

## 2018-11-15 NOTE — Telephone Encounter (Signed)
Continue current treatment with Coumadin and recheck INR in 1 week.

## 2018-11-18 ENCOUNTER — Other Ambulatory Visit: Payer: Self-pay

## 2018-11-18 ENCOUNTER — Encounter: Payer: Medicare Other | Admitting: *Deleted

## 2018-11-19 ENCOUNTER — Telehealth: Payer: Self-pay

## 2018-11-19 NOTE — Telephone Encounter (Signed)
Spoke with patient to remind of missed remote transmission 

## 2018-11-22 ENCOUNTER — Telehealth: Payer: Self-pay | Admitting: *Deleted

## 2018-11-22 DIAGNOSIS — Z7901 Long term (current) use of anticoagulants: Secondary | ICD-10-CM

## 2018-11-22 DIAGNOSIS — I4821 Permanent atrial fibrillation: Secondary | ICD-10-CM

## 2018-11-22 NOTE — Telephone Encounter (Signed)
Take 7.5mg  ( 1 1/2 tablets ) today the 5mg  daily and follow up in 1 week.

## 2018-11-22 NOTE — Telephone Encounter (Signed)
Fax received mdINR PT/INR self testing service Test date/time 11/22/18 10:35 am INR 1.1

## 2018-11-22 NOTE — Telephone Encounter (Signed)
Called and gave instructions to caregiver. She verbalized understanding

## 2018-12-01 ENCOUNTER — Telehealth: Payer: Self-pay | Admitting: *Deleted

## 2018-12-01 DIAGNOSIS — I4821 Permanent atrial fibrillation: Secondary | ICD-10-CM

## 2018-12-01 DIAGNOSIS — Z7901 Long term (current) use of anticoagulants: Secondary | ICD-10-CM

## 2018-12-01 NOTE — Telephone Encounter (Signed)
Hold Coumadin today Restart therapy at 5 mg daily every day.  This is if she has been taking 7-1/2 mg on Wednesday and 5 mg daily.  If that is the previous dosage regimen, once again hold today's dose and resume with 5 mg daily starting tomorrow.  Recheck INR in 1 week.

## 2018-12-01 NOTE — Telephone Encounter (Signed)
Fax received mdINR PT/INR self testing service Test date/time 11/30/18 7:55 pm INR 3.6

## 2018-12-01 NOTE — Telephone Encounter (Signed)
Currently taking   Take 7.5mg  ( 1 1/2 tablets ) today  On 11/22/18 - then 5mg  daily  Today is 1 week recheck.

## 2018-12-01 NOTE — Telephone Encounter (Signed)
Caregiver aware - answered home #. Aware of med changes for Protime.

## 2018-12-06 ENCOUNTER — Telehealth: Payer: Self-pay | Admitting: *Deleted

## 2018-12-06 DIAGNOSIS — I4821 Permanent atrial fibrillation: Secondary | ICD-10-CM | POA: Diagnosis not present

## 2018-12-06 NOTE — Telephone Encounter (Signed)
Continue current treatment and re-check INR in 1 week

## 2018-12-06 NOTE — Telephone Encounter (Signed)
MED INR = call pt caregiver with adjustments  INR today 2.8 Currently on 5 mg QD

## 2018-12-06 NOTE — Telephone Encounter (Signed)
Aware. 

## 2018-12-08 ENCOUNTER — Other Ambulatory Visit: Payer: Self-pay

## 2018-12-08 ENCOUNTER — Telehealth: Payer: Self-pay | Admitting: *Deleted

## 2018-12-08 ENCOUNTER — Ambulatory Visit (INDEPENDENT_AMBULATORY_CARE_PROVIDER_SITE_OTHER): Payer: Medicare Other | Admitting: Family Medicine

## 2018-12-08 ENCOUNTER — Encounter: Payer: Self-pay | Admitting: Family Medicine

## 2018-12-08 DIAGNOSIS — R29898 Other symptoms and signs involving the musculoskeletal system: Secondary | ICD-10-CM | POA: Diagnosis not present

## 2018-12-08 DIAGNOSIS — I080 Rheumatic disorders of both mitral and aortic valves: Secondary | ICD-10-CM | POA: Diagnosis not present

## 2018-12-08 DIAGNOSIS — I10 Essential (primary) hypertension: Secondary | ICD-10-CM

## 2018-12-08 DIAGNOSIS — H6122 Impacted cerumen, left ear: Secondary | ICD-10-CM

## 2018-12-08 DIAGNOSIS — I480 Paroxysmal atrial fibrillation: Secondary | ICD-10-CM

## 2018-12-08 DIAGNOSIS — E559 Vitamin D deficiency, unspecified: Secondary | ICD-10-CM | POA: Diagnosis not present

## 2018-12-08 DIAGNOSIS — Z993 Dependence on wheelchair: Secondary | ICD-10-CM | POA: Diagnosis not present

## 2018-12-08 DIAGNOSIS — Z950 Presence of cardiac pacemaker: Secondary | ICD-10-CM | POA: Diagnosis not present

## 2018-12-08 DIAGNOSIS — D509 Iron deficiency anemia, unspecified: Secondary | ICD-10-CM | POA: Diagnosis not present

## 2018-12-08 DIAGNOSIS — R7989 Other specified abnormal findings of blood chemistry: Secondary | ICD-10-CM

## 2018-12-08 DIAGNOSIS — I4821 Permanent atrial fibrillation: Secondary | ICD-10-CM

## 2018-12-08 DIAGNOSIS — Z7901 Long term (current) use of anticoagulants: Secondary | ICD-10-CM

## 2018-12-08 DIAGNOSIS — R54 Age-related physical debility: Secondary | ICD-10-CM | POA: Diagnosis not present

## 2018-12-08 MED ORDER — FUROSEMIDE 20 MG PO TABS: 20.0000 mg | ORAL_TABLET | Freq: Every day | ORAL | 3 refills | Status: DC | PRN

## 2018-12-08 NOTE — Addendum Note (Signed)
Addended by: Zannie Cove on: 12/08/2018 11:32 AM   Modules accepted: Orders

## 2018-12-08 NOTE — Telephone Encounter (Signed)
Pt aware.

## 2018-12-08 NOTE — Progress Notes (Signed)
Virtual Visit Via telephone Note I connected with@ on 12/08/18 by telephone and verified that I am speaking with the correct person or authorized healthcare agent using two identifiers. [name] is currently located at home and there are no unauthorized people in close proximity. I completed this visit while in a private location in my home .  I did speak with the patient and her caregiver [name] was assisting with the communication side because the patient does have trouble hearing.  This visit type was conducted due to national recommendations for restrictions regarding the COVID-19 Pandemic (e.g. social distancing).  This format is felt to be most appropriate for this patient at this time.  All issues noted in this document were discussed and addressed.  No physical exam was performed.    I discussed the limitations, risks, security and privacy concerns of performing an evaluation and management service by telephone and the availability of in person appointments. I also discussed with the patient that there may be a patient responsible charge related to this service. The patient expressed understanding and agreed to proceed.   Date:  12/08/2018    ID:  [name]      12-23-18        [id number]   Patient Care Team Patient Care Team: Chipper Herb, MD as PCP - General (Family Medicine) Minus Breeding, MD as Consulting Physician (Cardiology) Deboraha Sprang, MD as Consulting Physician (Cardiology) Garald Balding, MD as Consulting Physician (Orthopedic Surgery) Williams Che, MD (Inactive) as Consulting Physician (Ophthalmology)  Reason for Visit: Primary Care Follow-up     History of Present Illness & Review of Systems:     [name] is a 85 y.o. year old female primary care patient that presents today for a telehealth visit.  The family is currently doing home INR's.  There is been some adjustment issues each time and hopefully with some time  this will level out so we do not have to check it quite as often as we are now which is once weekly.  We will continue to check the once weekly until there is stability.  They do not have any weights or blood pressures.  She has not had any bleeding issues other than some bleeding from the gums and the dentist says that they need a thorough cleaning and he has not been able to schedule that visit yet.  The patient says and her caregiver says that her weight is stable.  She is taking boost.  She is not sure when her next cardiology visit is due.  She does need a new prescription to take for Lasix if needed for swelling because of a history of diastolic heart failure.  She does need routine lab work and we will hopefully get that in the next couple of months.  We will also check on when her visit is with a cardiologist the next time.  She denies any chest pain or shortness of breath anymore than usual.  She denies any trouble with swallowing heartburn indigestion nausea vomiting diarrhea or blood in the stool.  She is on iron replacement for iron deficiency anemia and her stools are dark.  She has chronic atrial fibrillation.  She is passing her water well.  Review of systems as stated, otherwise negative.  The patient does not have symptoms concerning for COVID-19 infection (fever, chills, cough, or new shortness of breath).      Current Medications (Verified)  Allergies as of 12/08/2018      Reactions   Atorvastatin    unknown   Boniva [ibandronate Sodium]    unknown   Chocolate    unknown   Lovastatin    unknown   Pravastatin Sodium    unknown   Risedronate Sodium    unknown   Toprol Xl [metoprolol Succinate]    unknown   Visken [pindolol]    unknown   Welchol [colesevelam Hcl]    unknown   Vesicare [solifenacin] Anxiety, Other (See Comments)   lightheaded      Medication List       Accurate as of Dec 08, 2018 10:03 AM. If you have any questions, ask your nurse or doctor.         b complex vitamins tablet Take 1 tablet by mouth daily.   cholecalciferol 25 MCG (1000 UT) tablet Commonly known as:  VITAMIN D Take 2,000 Units by mouth daily.   furosemide 20 MG tablet Commonly known as:  LASIX Take 1 tablet (20 mg total) by mouth daily as needed.   Integra 62.5-62.5-40-3 MG Caps TAKE ONE CAPSULE BY MOUTH EVERY DAILY   ipratropium 0.06 % nasal spray Commonly known as:  ATROVENT Place 2 sprays into the nose 3 (three) times daily.   lactose free nutrition Liqd Take 237 mLs by mouth daily.   Melatonin 3 MG Tabs Take 1 tablet by mouth at bedtime as needed.   polyethylene glycol powder 17 GM/SCOOP powder Commonly known as:  GLYCOLAX/MIRALAX TAKE 17 GRAMS (1 CAPFUL) IN 8 OZ OF FLUID TWICE DAILY   vitamin C 500 MG tablet Commonly known as:  ASCORBIC ACID Take 500 mg by mouth daily.   vitamin E 400 UNIT capsule Take 400 Units by mouth daily.   warfarin 5 MG tablet Commonly known as:  COUMADIN Take as directed by the anticoagulation clinic. If you are unsure how to take this medication, talk to your nurse or doctor. Original instructions:  TAKE 1/2 OR 1 TABLET BY MOUTH ONCE A DAY OR AS INSTRUCTED BY COUMADIN ANTICOAGULATION CLINIC           Allergies (Verified)    Atorvastatin; Boniva [ibandronate sodium]; Chocolate; Lovastatin; Pravastatin sodium; Risedronate sodium; Toprol xl [metoprolol succinate]; Visken [pindolol]; Welchol [colesevelam hcl]; and Vesicare [solifenacin]  Past Medical History Past Medical History:  Diagnosis Date  . Atrial fibrillation ([place])   . Carotid bruit   . Cataract   . Colon disorder    Adhesions  . Heart failure, diastolic ([place])   . Hyperlipidemia    statin intolerant  . Hypertension   . Mitral regurgitation    Moderate  . Nephrolithiasis   . Ovarian cyst   . QT prolongation    With solatol  . Restless leg syndrome   . S/P AV nodal ablation      Past Surgical History:  Procedure Laterality Date  . ABDOMINAL  ADHESION SURGERY     twice  . BREAST SURGERY Left 2010   lumpectomy  . CATARACT EXTRACTION     Bilaterally  . COLON SURGERY     Colonic obstruction secondary to diverticulitis with resection  . CYSTECTOMY     Ovarian  . EYE SURGERY    . JOINT REPLACEMENT Right 2004   hip  . PACEMAKER INSERTION     [employer]  . PERMANENT PACEMAKER GENERATOR CHANGE N/A 07/22/2012   Procedure: PERMANENT PACEMAKER GENERATOR CHANGE;  Surgeon: Deboraha Sprang, MD;  Location: [facility] CATH LAB;  Service: Cardiovascular;  Laterality: N/A;    Social History   Socioeconomic History  . Marital status: Divorced    Spouse name: Not on file  . Number of children: Not on file  . Years of education: Not on file  . Highest education level: Not on file  Occupational History  . Occupation: Retired    Fish farm manager: RETIRED  Social Needs  . Financial resource strain: Not on file  . Food insecurity:    Worry: Not on file    Inability: Not on file  . Transportation needs:    Medical: Not on file    Non-medical: Not on file  Tobacco Use  . Smoking status: Never Smoker  . Smokeless tobacco: Never Used  Substance and Sexual Activity  . Alcohol use: No  . Drug use: No  . Sexual activity: Never  Lifestyle  . Physical activity:    Days per week: Not on file    Minutes per session: Not on file  . Stress: Not on file  Relationships  . Social connections:    Talks on phone: Not on file    Gets together: Not on file    Attends religious service: Not on file    Active member of club or organization: Not on file    Attends meetings of clubs or organizations: Not on file    Relationship status: Not on file  Other Topics Concern  . Not on file  Social History Narrative  . Not on file     Family History  Problem Relation Age of Onset  . Diabetes Mother        type 2  . Diabetes Father        type 2  . Cancer Sister 88       colon      Labs/Other Tests and Data Reviewed:    Wt Readings from Last 3  Encounters:  10/06/18 145 lb (65.8 kg)  08/06/18 145 lb (65.8 kg)  11/06/17 145 lb (65.8 kg)   Temp Readings from Last 3 Encounters:  10/06/18 98.1 F (36.7 C) (Oral)  08/06/18 (!) 97.4 F (36.3 C) (Oral)  06/22/18 (!) 97.4 F (36.3 C) (Oral)   BP Readings from Last 3 Encounters:  10/06/18 130/67  08/06/18 112/69  06/22/18 117/61   Pulse Readings from Last 3 Encounters:  10/06/18 76  08/06/18 69  06/22/18 69     Lab Results  Component Value Date   HGBA1C 6.4 10/21/2016   HGBA1C 6.1 (H) 02/20/2016   Lab Results  Component Value Date   LDLCALC 133 (H) 08/06/2018   CREATININE 0.62 10/06/2018       Chemistry      Component Value Date/Time   NA 136 10/06/2018 1052   NA 137 08/06/2018 1603   K 4.1 10/06/2018 1052   CL 99 10/06/2018 1052   CO2 29 10/06/2018 1052   BUN 30 (H) 10/06/2018 1052   BUN 21 08/06/2018 1603   CREATININE 0.62 10/06/2018 1052   CREATININE 0.97 12/13/2012 1246      Component Value Date/Time   CALCIUM 9.8 10/06/2018 1052   ALKPHOS 62 10/06/2018 1052   AST 21 10/06/2018 1052   ALT 13 10/06/2018 1052   BILITOT 0.7 10/06/2018 1052   BILITOT 0.7 08/06/2018 1603         OBSERVATIONS/ OBJECTIVE:     Patient was pleasant and responded appropriately when speaking directly to her questions answers SERD by her.  Her caregiver did most of  the talking but the patient was in the background and she did ask the patient about any questions that I had and she did not respond appropriately to those questions.  She does not walk is much.  She has a walker and wheelchair at home.  They will try to do better with checking weights and blood pressures at home and will keep [place] posted about that.  She is due to get some blood work and we will have her come in for blood work in the next couple of months and she will call and arrange this ahead of time.  We do need to refill her Lasix to take if needed.  Physical exam deferred due to nature of telephonic visit.   ASSESSMENT & PLAN    Time:   Today, I have spent 25 minutes with the patient via telephone discussing the above including Covid precautions.     Visit Diagnoses: 1. Iron deficiency anemia, unspecified iron deficiency anemia type -Continue with Integra and check CBC with next blood work.  2. Vitamin D deficiency -Check vitamin D level at next blood work  3. Paroxysmal atrial fibrillation (HCC) -Continue with anticoagulation as currently doing and check INR is at home weekly until there is stability and then this may be spaced out.  4. Wheelchair dependent -Continue to walk with assistance but always being careful not to put herself at risk for falling  5. Frailty syndrome in geriatric patient -This is basically unchanged.  She has not had any further falls.  6. Long term current use of anticoagulant therapy -There is no history of any bleeding other than some bleeding from the gums and the dentist will schedule a thorough cleaning when the time becomes appropriate to help rectify the situation.  7. Weakness of both lower extremities -This is presbycusis and caregiver provided assistance with communication during the visit today.  8. Hearing loss due to cerumen impaction, left -Continue to check this especially at next office visit  9. MITRAL REGURGITATION -Follow-up with cardiology as planned  10. PACEMAKER, PERMANENT -Follow-up with cardiology as planned  11. HYPERTENSION, BENIGN -Home blood pressure readings more regularly and call the office when these are obtained and we will hopefully try to get blood pressures and weights at least once a week by the caregiver at home.  Patient Instructions  Make sure the patient drinks plenty of water and fluids daily Watch sodium intake Patient should continue to be careful not put herself at risk for falling Check weights and blood pressures if possible at least once weekly We will check with cardiologist regarding need for next  visit The patient should continue to be careful and wear mask when out in public and practice good handwashing hygiene. She should come to the office in a couple of months and arrange to get blood work including a thyroid profile. We will call the cardiologist office and make sure that she has an appropriate visit scheduled for follow-up of her cardiology issues. We will make sure that she has a refill on her Lasix to use if needed     The above assessment and management plan was discussed with the patient. The patient verbalized understanding of and has agreed to the management plan. Patient is aware to call the clinic if symptoms persist or worsen. Patient is aware when to return to the clinic for a follow-up visit. Patient educated on when it is appropriate to go to the emergency department.    Chipper Herb, MD  [facility] [address], [place], [place] [address] Ph [phone]   Arrie Senate MD

## 2018-12-08 NOTE — Patient Instructions (Signed)
Make sure the patient drinks plenty of water and fluids daily Watch sodium intake Patient should continue to be careful not put herself at risk for falling Check weights and blood pressures if possible at least once weekly We will check with cardiologist regarding need for next visit The patient should continue to be careful and wear mask when out in public and practice good handwashing hygiene. She should come to the office in a couple of months and arrange to get blood work including a thyroid profile. We will call the cardiologist office and make sure that she has an appropriate visit scheduled for follow-up of her cardiology issues. We will make sure that she has a refill on her Lasix to use if needed

## 2018-12-08 NOTE — Telephone Encounter (Signed)
Continue current Coumadin treatment and recheck INR again in 1 week

## 2018-12-08 NOTE — Telephone Encounter (Signed)
Fax received [facility]INR PT/INR self testing service Test date/time 12/06/18 10:47 am INR 2.8

## 2018-12-14 ENCOUNTER — Telehealth: Payer: Self-pay | Admitting: Family Medicine

## 2018-12-14 ENCOUNTER — Telehealth: Payer: Self-pay | Admitting: *Deleted

## 2018-12-14 DIAGNOSIS — I4821 Permanent atrial fibrillation: Secondary | ICD-10-CM

## 2018-12-14 DIAGNOSIS — Z7901 Long term (current) use of anticoagulants: Secondary | ICD-10-CM

## 2018-12-14 NOTE — Telephone Encounter (Signed)
Hold Coumadin as directed by previous note and restart at 2-1/2 mg daily Thursday after holding it for 2 days.  This is a slight change from the previous note.++++++

## 2018-12-14 NOTE — Telephone Encounter (Signed)
Per moore hold TUES and WED - resume on Thursday at 2.5 QD until next weekly check - caregiver aware -jhb

## 2018-12-14 NOTE — Telephone Encounter (Signed)
Currently on 5 mg QD  5/11 was 2.8 5/13 was 2.8  We continued same dose   Today is 3.6

## 2018-12-14 NOTE — Telephone Encounter (Signed)
12/13/18 Bp 91/64  Pulse 71 *Second time it was 108/69  Pulse 74 Wt:141  Protime was 3.6. They have not given her warfrin yet. Waiting on call from [place]

## 2018-12-14 NOTE — Telephone Encounter (Signed)
INR is too thin. Hold Coumadin for 2 days tonight and tomorrow and restart on Friday.  Do 2-1/2 mg every day starting on Friday and recheck INR next Wednesday.  Talk with caregiver about this and make sure that directions are followed closely.

## 2018-12-14 NOTE — Telephone Encounter (Signed)
Fax received [facility]INR PT/INR self testing service Test date/time 12/13/18 6:01 pm INR 3.6

## 2018-12-21 ENCOUNTER — Telehealth: Payer: Self-pay | Admitting: Family Medicine

## 2018-12-21 NOTE — Telephone Encounter (Signed)
I have sent a message to my contacts with [facility] to see if they can help with getting her supplies

## 2018-12-21 NOTE — Telephone Encounter (Signed)
I sent this to you because I thought maybe you ordered this and knew about it. [name] is not here.

## 2018-12-22 NOTE — Telephone Encounter (Signed)
[facility] has contacted and gotten them straight

## 2018-12-23 ENCOUNTER — Telehealth: Payer: Self-pay | Admitting: *Deleted

## 2018-12-23 DIAGNOSIS — I4821 Permanent atrial fibrillation: Secondary | ICD-10-CM

## 2018-12-23 DIAGNOSIS — Z7901 Long term (current) use of anticoagulants: Secondary | ICD-10-CM

## 2018-12-23 NOTE — Telephone Encounter (Signed)
Last time ( 12/14/18 -was 3.6 that day  ) she was told to hold TUES and WED - resume on Thursday at 2.5 QD until next weekly check

## 2018-12-23 NOTE — Telephone Encounter (Signed)
Fax received mdINR PT/INR self testing service Test date/time 12/22/18 732pm INR 1.4

## 2018-12-23 NOTE — Telephone Encounter (Signed)
Take an extra half of the pill today only and then resume with 2.5 daily and recheck an INR next Wednesday

## 2018-12-23 NOTE — Telephone Encounter (Signed)
Called pt home # - and caregiver aware of changes

## 2018-12-30 ENCOUNTER — Telehealth: Payer: Self-pay | Admitting: *Deleted

## 2018-12-30 DIAGNOSIS — Z7901 Long term (current) use of anticoagulants: Secondary | ICD-10-CM

## 2018-12-30 DIAGNOSIS — I4821 Permanent atrial fibrillation: Secondary | ICD-10-CM

## 2018-12-30 NOTE — Telephone Encounter (Signed)
Increase coumadin to 1 1/2 tablet on Thursday and Sunday and 1 tablet on all other days

## 2018-12-30 NOTE — Telephone Encounter (Signed)
Patient's caregiver notified and verbalized understanding 

## 2018-12-30 NOTE — Telephone Encounter (Signed)
Telephone call received [facility]INR PT/INR self testing service Out of range INR 1.1

## 2019-01-03 ENCOUNTER — Telehealth: Payer: Self-pay | Admitting: *Deleted

## 2019-01-03 NOTE — Telephone Encounter (Signed)
Dup note  

## 2019-01-05 DIAGNOSIS — I4821 Permanent atrial fibrillation: Secondary | ICD-10-CM | POA: Diagnosis not present

## 2019-01-06 ENCOUNTER — Telehealth: Payer: Self-pay | Admitting: *Deleted

## 2019-01-06 ENCOUNTER — Telehealth: Payer: Self-pay | Admitting: Family Medicine

## 2019-01-06 DIAGNOSIS — I4821 Permanent atrial fibrillation: Secondary | ICD-10-CM

## 2019-01-06 DIAGNOSIS — Z7901 Long term (current) use of anticoagulants: Secondary | ICD-10-CM

## 2019-01-06 DIAGNOSIS — R54 Age-related physical debility: Secondary | ICD-10-CM

## 2019-01-06 DIAGNOSIS — Z993 Dependence on wheelchair: Secondary | ICD-10-CM

## 2019-01-06 DIAGNOSIS — R29898 Other symptoms and signs involving the musculoskeletal system: Secondary | ICD-10-CM

## 2019-01-06 NOTE — Telephone Encounter (Addendum)
Taking 1 whole tab everyday They are 5mg    Per - the caregiver

## 2019-01-06 NOTE — Telephone Encounter (Signed)
Confirm current dose of Coumadin.  The INR is trending upward and she is elderly.  I will repeat the INR in 1 week.  May consider decreasing dose once I know what the current dose is.

## 2019-01-06 NOTE — Telephone Encounter (Signed)
Caregiver aware.

## 2019-01-06 NOTE — Telephone Encounter (Signed)
Fax received mdINR PT/INR self testing service Test date/time 01/05/19 8:39 pm INR 2.7

## 2019-01-06 NOTE — Telephone Encounter (Signed)
Order ready for pick up = family

## 2019-01-06 NOTE — Telephone Encounter (Signed)
The INR is good but I think since it is trending upward I would have her take a half of 1 on Thursday night only and a whole one on all the other days.  Recheck INR in 1 week.

## 2019-01-12 ENCOUNTER — Other Ambulatory Visit: Payer: Self-pay | Admitting: *Deleted

## 2019-01-12 DIAGNOSIS — Z993 Dependence on wheelchair: Secondary | ICD-10-CM

## 2019-01-12 DIAGNOSIS — R29898 Other symptoms and signs involving the musculoskeletal system: Secondary | ICD-10-CM

## 2019-01-12 DIAGNOSIS — R54 Age-related physical debility: Secondary | ICD-10-CM

## 2019-01-13 ENCOUNTER — Telehealth: Payer: Self-pay | Admitting: *Deleted

## 2019-01-13 DIAGNOSIS — I4821 Permanent atrial fibrillation: Secondary | ICD-10-CM

## 2019-01-13 DIAGNOSIS — Z7901 Long term (current) use of anticoagulants: Secondary | ICD-10-CM

## 2019-01-13 NOTE — Telephone Encounter (Signed)
Continue current dose of 1 1/2 tablet on Thursday and Sunday and 5mg  all other days  INR today is 2.7  Goal is 2.0-3.0   Recheck in 1 month

## 2019-01-13 NOTE — Telephone Encounter (Signed)
Ducor daughter aware of results and current dose

## 2019-01-13 NOTE — Telephone Encounter (Signed)
Fax received mdINR PT/INR self testing service Test date/time 01/12/19 6:36 pm INR 2.7

## 2019-01-20 ENCOUNTER — Telehealth: Payer: Self-pay | Admitting: *Deleted

## 2019-01-20 NOTE — Telephone Encounter (Signed)
If the patient is taking 5 mg tablets currently, increase this and have her take 7-1/2 mg on Tuesday Thursday and Sunday and 5 mg all other days.  Recheck INR in 2 weeks.  Please talk to caregiver about this change in treatment and make sure that it is done correctly.

## 2019-01-20 NOTE — Telephone Encounter (Signed)
Caregiver aware of final instructions

## 2019-01-20 NOTE — Telephone Encounter (Signed)
Caregiver called - current dose in epic was incorrect  Here is how she is currently taking it:  5 mg 5 days a week ( 1 whole tab) And 2.5 mg ( 1/2 tab ) on Thurs and SUN    Please address how to change and go forward.

## 2019-01-20 NOTE — Telephone Encounter (Signed)
After finding out information from the family that is administering Coumadin to [name], the dosage will be changed to 5 mg daily except for 2-1/2 mg on Sunday and repeat pro time in 1 week

## 2019-01-20 NOTE — Telephone Encounter (Signed)
current dose of 1 1/2 tablet on Thursday and Sunday and 5mg  all other days

## 2019-01-24 ENCOUNTER — Telehealth: Payer: Self-pay | Admitting: Family Medicine

## 2019-01-24 NOTE — Telephone Encounter (Signed)
Aware.    Original provider has retired.  Will need to choose a new provider and have an appointment to be seen and have lab work.  Family will call to schedule.

## 2019-01-27 ENCOUNTER — Telehealth: Payer: Self-pay | Admitting: Family Medicine

## 2019-01-27 NOTE — Telephone Encounter (Signed)
NA

## 2019-01-28 ENCOUNTER — Telehealth: Payer: Self-pay | Admitting: Family Medicine

## 2019-01-28 DIAGNOSIS — I4821 Permanent atrial fibrillation: Secondary | ICD-10-CM

## 2019-01-28 DIAGNOSIS — Z7901 Long term (current) use of anticoagulants: Secondary | ICD-10-CM

## 2019-01-28 NOTE — Telephone Encounter (Signed)
Fax received mdINR PT/INR self testing service Test date/time 01/27/2019 06:17:19 INR 3.0

## 2019-01-28 NOTE — Telephone Encounter (Signed)
Description   Hold today's dose and then continue current dose of 1 1/2 tablet on Thursday and Sunday and 5mg  all other days  INR today is 3.0  Goal is 2.0-3.0   Recheck in 1 month  Recheck in one month     Caryl Pina, MD [place] 01/28/2019, 12:42 PM

## 2019-02-01 NOTE — Telephone Encounter (Signed)
Spoke with Ilda Mori (caregiver)- gave recommendations.  States patient has already took her does today. Also states that there is a mix up and patient is only taking 1/2 pill on Thursday.  Would like to know if patient should skip does for tomorrow and take 1/2 pill Thur? Patient's INR will be tested again on Thursday. Covering PCP- please advise

## 2019-02-01 NOTE — Telephone Encounter (Signed)
Caregiver aware and verbalizes understanding.  

## 2019-02-01 NOTE — Telephone Encounter (Signed)
I am confused on dose. Recheck Thursday and let me know exactly how she has been taking. Computer says something different

## 2019-02-01 NOTE — Telephone Encounter (Signed)
Refer to previous phone note 

## 2019-02-03 DIAGNOSIS — I4821 Permanent atrial fibrillation: Secondary | ICD-10-CM | POA: Diagnosis not present

## 2019-02-04 ENCOUNTER — Other Ambulatory Visit: Payer: Self-pay | Admitting: Family Medicine

## 2019-02-04 ENCOUNTER — Telehealth: Payer: Self-pay | Admitting: Family Medicine

## 2019-02-04 DIAGNOSIS — Z7901 Long term (current) use of anticoagulants: Secondary | ICD-10-CM

## 2019-02-04 DIAGNOSIS — I4821 Permanent atrial fibrillation: Secondary | ICD-10-CM

## 2019-02-04 NOTE — Telephone Encounter (Signed)
[name] aware

## 2019-02-04 NOTE — Telephone Encounter (Signed)
Fax received mdINR PT/INR self testing service Test date/time 02/03/2019 6:57pm INR 4.5

## 2019-02-04 NOTE — Telephone Encounter (Signed)
Hold today's dose and then decrease to  5mg  (1 tablet) all  days  INR today is 4.5 (too thin!)  Goal is 2.0-3.0    Recheck in one week

## 2019-02-11 ENCOUNTER — Telehealth: Payer: Self-pay

## 2019-02-11 DIAGNOSIS — I4821 Permanent atrial fibrillation: Secondary | ICD-10-CM

## 2019-02-11 DIAGNOSIS — Z7901 Long term (current) use of anticoagulants: Secondary | ICD-10-CM

## 2019-02-11 LAB — POCT INR: INR: 3 (ref 2–3)

## 2019-02-11 NOTE — Telephone Encounter (Signed)
Aware. 

## 2019-02-11 NOTE — Telephone Encounter (Signed)
PT/INR results 3.0. Range is between 2-3.

## 2019-02-11 NOTE — Telephone Encounter (Signed)
Description   continue 5mg  (1 tablet) all  days  INR today is 3.0  Goal is 2.0-3.0, recheck in 2 weeks     Caryl Pina, MD [place] 02/11/2019, 12:51 PM

## 2019-02-18 ENCOUNTER — Telehealth: Payer: Self-pay | Admitting: *Deleted

## 2019-02-18 DIAGNOSIS — I4821 Permanent atrial fibrillation: Secondary | ICD-10-CM

## 2019-02-18 DIAGNOSIS — Z7901 Long term (current) use of anticoagulants: Secondary | ICD-10-CM

## 2019-02-18 NOTE — Telephone Encounter (Signed)
Patient caretaker aware Rosann Auerbach.

## 2019-02-18 NOTE — Telephone Encounter (Signed)
Fax received mdINR PT/INR self testing service Test date/time 02/17/19 8:20 pm INR 2.4

## 2019-02-18 NOTE — Telephone Encounter (Signed)
Description   continue 5mg  (1 tablet) all  days  INR today is 2.4  Goal is 2.0-3.0,  recheck in 2 weeks      Caryl Pina, MD [place] 02/18/2019, 8:46 AM

## 2019-02-24 ENCOUNTER — Telehealth (INDEPENDENT_AMBULATORY_CARE_PROVIDER_SITE_OTHER): Payer: Medicare Other | Admitting: Family Medicine

## 2019-02-24 DIAGNOSIS — Z7901 Long term (current) use of anticoagulants: Secondary | ICD-10-CM

## 2019-02-24 DIAGNOSIS — I4821 Permanent atrial fibrillation: Secondary | ICD-10-CM | POA: Diagnosis not present

## 2019-02-24 NOTE — Telephone Encounter (Signed)
Discussed with patient's caretaker that her Coumadin level was too high and she had an INR of 5.0.  He says times over the past month or so she has had gum bleeding although it looks like her most recent INR before this 1 was normal. Description   Hold tomorrow and the next, take 1/2 on mondays and then 5mg  (1 tablet) rest of the time  INR today is 5.0  Goal is 2.0-3.0,  recheck in 1 weeks      I discussed with the grandson about getting her reassigned to a PCP in the office and about having the discussion with the family about whether or not they even wanted her to still be on the Coumadin or not weighing the risks of the medicine at her age versus the benefits of stroke prevention at her age. Caryl Pina, MD [facility] 02/24/2019, 10:40 PM   Patient needs to be reassigned a new PCP and I told her that they needed to recheck in 1 week so they will need to be reassigned to somebody who can monitor it next week while I am on vacation.

## 2019-02-25 ENCOUNTER — Telehealth: Payer: Self-pay | Admitting: *Deleted

## 2019-02-25 NOTE — Telephone Encounter (Signed)
Fax received mdINR PT/INR self testing service Test date/time 02/24/19 7:58 pm INR 5.0  Closing encounter Taken care of in telephone encounter on 02/24/19

## 2019-02-28 NOTE — Telephone Encounter (Signed)
Patient's caregiver aware 

## 2019-03-03 DIAGNOSIS — I4821 Permanent atrial fibrillation: Secondary | ICD-10-CM | POA: Diagnosis not present

## 2019-03-04 ENCOUNTER — Telehealth: Payer: Self-pay | Admitting: *Deleted

## 2019-03-04 DIAGNOSIS — Z7901 Long term (current) use of anticoagulants: Secondary | ICD-10-CM

## 2019-03-04 DIAGNOSIS — I4821 Permanent atrial fibrillation: Secondary | ICD-10-CM

## 2019-03-04 NOTE — Telephone Encounter (Signed)
Unable to get numbers for phones to go through.

## 2019-03-04 NOTE — Telephone Encounter (Signed)
INR 2:9  Current: 1/2 tab on Mondays and then 5mg  (1 tablet) rest of the time  Goal is 2.0-3.0, 1 week recheck  Terald Sleeper PA-C [place] [address]  [place], [place] [address] [phone]

## 2019-03-04 NOTE — Telephone Encounter (Signed)
Fax received mdINR PT/INR self testing service Test date/time 03/03/19 6:55 pm INR 2.9

## 2019-03-10 ENCOUNTER — Telehealth: Payer: Self-pay | Admitting: *Deleted

## 2019-03-10 DIAGNOSIS — I4821 Permanent atrial fibrillation: Secondary | ICD-10-CM

## 2019-03-10 DIAGNOSIS — Z7901 Long term (current) use of anticoagulants: Secondary | ICD-10-CM

## 2019-03-10 NOTE — Telephone Encounter (Signed)
Fax received mdINR PT/INR self testing service Test date/time 03/10/19 2:51 pm INR 3.7

## 2019-03-10 NOTE — Telephone Encounter (Signed)
Description   Hold tonight's dose and then go back to 1/2 tab on Mondays and then 5mg  (1 tablet) rest of the time INR 3.7, too thin Goal is 2.0-3.0, 1 week recheck     Caryl Pina, MD [place] 03/10/2019, 3:03 PM   Patient also needs to be assigned a PCP, looks like she does not have one since Dr. Laurance Flatten

## 2019-03-10 NOTE — Telephone Encounter (Signed)
Aware of coumadin dosage  

## 2019-03-18 ENCOUNTER — Telehealth: Payer: Self-pay | Admitting: *Deleted

## 2019-03-18 DIAGNOSIS — Z7901 Long term (current) use of anticoagulants: Secondary | ICD-10-CM

## 2019-03-18 DIAGNOSIS — I4821 Permanent atrial fibrillation: Secondary | ICD-10-CM

## 2019-03-18 LAB — PROTIME-INR: INR: 1.3 — AB (ref 0.9–1.1)

## 2019-03-18 NOTE — Telephone Encounter (Signed)
Fax received mdINR PT/INR self testing service Test date/time 03/17/19 6:58 pm INR 1.3

## 2019-03-18 NOTE — Telephone Encounter (Signed)
Please contact the patient  For coumadin 5 mg tab-  Description   Use 1 1/2 tabs today. Then resume regular schedule Goal is 2.0-3.0, 1 week recheck

## 2019-03-18 NOTE — Telephone Encounter (Signed)
Aware of coumadin dosage  

## 2019-03-22 ENCOUNTER — Other Ambulatory Visit: Payer: Self-pay | Admitting: Family Medicine

## 2019-03-24 ENCOUNTER — Telehealth: Payer: Self-pay | Admitting: Family Medicine

## 2019-03-24 DIAGNOSIS — Z7901 Long term (current) use of anticoagulants: Secondary | ICD-10-CM

## 2019-03-24 DIAGNOSIS — I4821 Permanent atrial fibrillation: Secondary | ICD-10-CM

## 2019-03-24 NOTE — Telephone Encounter (Signed)
Description   Hold fri, sat, and sunday. Then take 2.5mg  on M, F and 5mg  the rest of the week Goal is 2.0-3.0, 6.7 today 5 day recheck Tonna Boehringer caregiver was whom was relayed info    Had a discussion with granddaughter about possibly stopping the Coumadin and that she may be more at risk taking it than it would benefit of possible preventing stroke Caryl Pina, MD [place] 03/24/2019, 8:42 PM

## 2019-03-25 ENCOUNTER — Telehealth: Payer: Self-pay | Admitting: Family Medicine

## 2019-03-25 DIAGNOSIS — I4821 Permanent atrial fibrillation: Secondary | ICD-10-CM

## 2019-03-25 DIAGNOSIS — Z7901 Long term (current) use of anticoagulants: Secondary | ICD-10-CM

## 2019-03-25 NOTE — Telephone Encounter (Signed)
Already addressed

## 2019-03-25 NOTE — Telephone Encounter (Signed)
Fax received mdINR PT/INR self testing service Test date/time 03/24/2019 06:31 pm INR 6.5

## 2019-03-29 ENCOUNTER — Telehealth: Payer: Medicare Other | Admitting: Family Medicine

## 2019-03-29 ENCOUNTER — Other Ambulatory Visit: Payer: Self-pay

## 2019-03-29 ENCOUNTER — Telehealth: Payer: Self-pay | Admitting: Family Medicine

## 2019-03-29 NOTE — Telephone Encounter (Signed)
Pt needs F2F visit for [facility] orders Niece will active MyChart to help patient with appt

## 2019-03-30 ENCOUNTER — Telehealth: Payer: Self-pay | Admitting: *Deleted

## 2019-03-30 NOTE — Telephone Encounter (Signed)
Continue current coumadin dosing. Re-check INR on Tuesday, September 8th.

## 2019-03-30 NOTE — Telephone Encounter (Signed)
Aware of instructions. 

## 2019-03-30 NOTE — Progress Notes (Signed)
erroneous

## 2019-03-30 NOTE — Telephone Encounter (Signed)
TC on INR results today mdINR PT/INR self testing service INR 1.8

## 2019-03-31 ENCOUNTER — Telehealth (INDEPENDENT_AMBULATORY_CARE_PROVIDER_SITE_OTHER): Payer: Medicare Other | Admitting: Family Medicine

## 2019-03-31 ENCOUNTER — Telehealth: Payer: Self-pay | Admitting: *Deleted

## 2019-03-31 DIAGNOSIS — Z7901 Long term (current) use of anticoagulants: Secondary | ICD-10-CM

## 2019-03-31 DIAGNOSIS — I4821 Permanent atrial fibrillation: Secondary | ICD-10-CM | POA: Diagnosis not present

## 2019-03-31 DIAGNOSIS — L97911 Non-pressure chronic ulcer of unspecified part of right lower leg limited to breakdown of skin: Secondary | ICD-10-CM | POA: Diagnosis not present

## 2019-03-31 DIAGNOSIS — Z7401 Bed confinement status: Secondary | ICD-10-CM | POA: Diagnosis not present

## 2019-03-31 NOTE — Telephone Encounter (Signed)
VM from grandson Confused on coming off of blood thinner Then her blood was way to thick and caregivers got different instructions Please give grandson a call back to get this straightened out

## 2019-03-31 NOTE — Progress Notes (Signed)
Subjective:  Patient ID: [name], female    DOB: [birth year]  Age: 85 y.o. MRN: [id number]  CC: No chief complaint on file.   HPI Janautica B Hanners presents for bed sores.Onset 1 week ago. Using warfarin. One week ago INR went to 6.0 Yesterday  INR was 1.8. The day after the INR was elevated. This is managed by  [facility]. She developed large bruises on the right leg.She could no longer put pressure on her legs. "dead weight." would not touch down with RLE. Got lift yesterday. Need HH to come out.  Depression screen [facility] 2/9 08/06/2018 06/22/2018 04/06/2018  Decreased Interest 0 0 0  Down, Depressed, Hopeless 1 0 1  PHQ - 2 Score 1 0 1  Altered sleeping - 0 -  Tired, decreased energy - 0 -  Change in appetite - 0 -  Feeling bad or failure about yourself  - 0 -  Trouble concentrating - 0 -  Moving slowly or fidgety/restless - 0 -  Suicidal thoughts - 0 -  PHQ-9 Score - 0 -  Difficult doing work/chores - Not difficult at all -  Some recent data might be hidden    History Laniece has a past medical history of Atrial fibrillation ([place]), Carotid bruit, Cataract, Colon disorder, Heart failure, diastolic (HCC), Hyperlipidemia, Hypertension, Mitral regurgitation, Nephrolithiasis, Ovarian cyst, QT prolongation, Restless leg syndrome, and S/P AV nodal ablation.   She has a past surgical history that includes Pacemaker insertion; Cystectomy; Abdominal adhesion surgery; Cataract extraction; Colon surgery; Joint replacement (Right, 2004); Permanent pacemaker generator change (N/A, 07/22/2012); Eye surgery; and Breast surgery (Left, 2010).   Her family history includes Cancer (age of onset: 45) in her sister; Diabetes in her father and mother.She reports that she has never smoked. She has never used smokeless tobacco. She reports that she does not drink alcohol or use drugs.    ROS Review of Systems  Constitutional: Positive for activity change (bedridden, was taking a few steps) and fatigue.  Negative for appetite change and fever.  HENT: Negative.   Respiratory: Negative for shortness of breath.   Cardiovascular: Positive for leg swelling. Negative for chest pain.  Gastrointestinal: Negative for abdominal pain.  Musculoskeletal: Positive for myalgias.  Psychiatric/Behavioral: Negative for agitation and dysphoric mood. The patient is not nervous/anxious.     Objective:  There were no vitals taken for this visit.  BP Readings from Last 3 Encounters:  10/06/18 130/67  08/06/18 112/69  06/22/18 117/61    Wt Readings from Last 3 Encounters:  10/06/18 145 lb (65.8 kg)  08/06/18 145 lb (65.8 kg)  11/06/17 145 lb (65.8 kg)     Physical Exam Neurological:     Mental Status: She is alert.   Laying in bed, viewed by My Chart Video She is lucid, answering some questions briefly Appears very frail, decrepit on video  There is massive hematoma formation at right shin and calf. There is maceration. There is a 1.5 cm hematoma associated with skin tear at proximal right leg. There is a small amount of hematoma at the left medial leg.  Further exam limited by video format     Assessment & Plan:   Diagnoses and all orders for this visit:  Leg ulcer, right, limited to breakdown of skin ([place]) -     Ambulatory referral to [place] -     Ambulatory referral to [place] atrial fibrillation  Long term current use of anticoagulant therapy  I am having Elnor B. Conti maintain her b complex vitamins, vitamin C, cholecalciferol, vitamin E, lactose free nutrition, ipratropium, Melatonin, Integra, furosemide, warfarin, and polyethylene glycol powder.  Allergies as of 03/31/2019      Reactions   Atorvastatin    unknown   Boniva [ibandronate Sodium]    unknown   Chocolate    unknown   Lovastatin    unknown   Pravastatin Sodium    unknown   Risedronate Sodium    unknown   Toprol Xl [metoprolol Succinate]    unknown   Visken  [pindolol]    unknown   Welchol [colesevelam Hcl]    unknown   Vesicare [solifenacin] Anxiety, Other (See Comments)   lightheaded      Medication List       Accurate as of March 31, 2019 11:59 PM. If you have any questions, ask your nurse or doctor.        b complex vitamins tablet Take 1 tablet by mouth daily.   cholecalciferol 25 MCG (1000 UT) tablet Commonly known as: VITAMIN D Take 2,000 Units by mouth daily.   furosemide 20 MG tablet Commonly known as: LASIX Take 1 tablet (20 mg total) by mouth daily as needed.   Integra 62.5-62.5-40-3 MG Caps TAKE ONE CAPSULE BY MOUTH EVERY DAILY   ipratropium 0.06 % nasal spray Commonly known as: ATROVENT Place 2 sprays into the nose 3 (three) times daily.   lactose free nutrition Liqd Take 237 mLs by mouth daily.   Melatonin 3 MG Tabs Take 1 tablet by mouth at bedtime as needed.   polyethylene glycol powder 17 GM/SCOOP powder Commonly known as: GLYCOLAX/MIRALAX TAKE 17 GRAMS (1 CAPFUL) IN 8 OZ OF FLUID TWICE DAILY   vitamin C 500 MG tablet Commonly known as: ASCORBIC ACID Take 500 mg by mouth daily.   vitamin E 400 UNIT capsule Take 400 Units by mouth daily.   warfarin 5 MG tablet Commonly known as: COUMADIN Take as directed by the anticoagulation clinic. If you are unsure how to take this medication, talk to your nurse or doctor. Original instructions: TAKE 1/2 OR 1 TABLET BY MOUTH ONCE A DAY OR AS INSTRUCTED BY COUMADIN ANTICOAGULATION CLINIC      Virtual Visit via telephone Note  I discussed the limitations, risks, security and privacy concerns of performing an evaluation and management service by telephone and the availability of in person appointments. I also discussed with the patient that there may be a patient responsible charge related to this service. The patient expressed understanding and agreed to proceed. Pt. Is at home. Dr. Livia Snellen is in his office.  Follow Up Instructions:   I discussed the  assessment and treatment plan with the patient. The patient was provided an opportunity to ask questions and all were answered. The patient agreed with the plan and demonstrated an understanding of the instructions.   The patient was advised to call back or seek an in-person evaluation if the symptoms worsen or if the condition fails to improve as anticipated.  Total minutes including chart review and phone contact time: 32   Follow-up: Return in about 2 weeks (around 04/14/2019).  Claretta Fraise, M.D.

## 2019-04-01 ENCOUNTER — Encounter: Payer: Self-pay | Admitting: Family Medicine

## 2019-04-01 ENCOUNTER — Telehealth: Payer: Self-pay | Admitting: Family Medicine

## 2019-04-01 NOTE — Telephone Encounter (Signed)
Indication: Afib Goal INR: 2-3  Recommendations:  At goal.  Continue current regimen

## 2019-04-01 NOTE — Telephone Encounter (Signed)
Fax received mdINR PT/INR self testing service Test date/time 03/31/19 7:52pm INR 2.2

## 2019-04-01 NOTE — Telephone Encounter (Signed)
PATIENTS CAREGIVER MARSHA AWARE

## 2019-04-04 DIAGNOSIS — I4821 Permanent atrial fibrillation: Secondary | ICD-10-CM | POA: Diagnosis not present

## 2019-04-04 DIAGNOSIS — Z7401 Bed confinement status: Secondary | ICD-10-CM | POA: Diagnosis not present

## 2019-04-04 DIAGNOSIS — M6281 Muscle weakness (generalized): Secondary | ICD-10-CM | POA: Diagnosis not present

## 2019-04-04 DIAGNOSIS — Z7901 Long term (current) use of anticoagulants: Secondary | ICD-10-CM | POA: Diagnosis not present

## 2019-04-04 DIAGNOSIS — I5030 Unspecified diastolic (congestive) heart failure: Secondary | ICD-10-CM | POA: Diagnosis not present

## 2019-04-04 DIAGNOSIS — I110 Hypertensive heart disease with heart failure: Secondary | ICD-10-CM | POA: Diagnosis not present

## 2019-04-04 DIAGNOSIS — R32 Unspecified urinary incontinence: Secondary | ICD-10-CM | POA: Diagnosis not present

## 2019-04-04 DIAGNOSIS — I340 Nonrheumatic mitral (valve) insufficiency: Secondary | ICD-10-CM | POA: Diagnosis not present

## 2019-04-04 DIAGNOSIS — G2581 Restless legs syndrome: Secondary | ICD-10-CM | POA: Diagnosis not present

## 2019-04-04 DIAGNOSIS — Z950 Presence of cardiac pacemaker: Secondary | ICD-10-CM | POA: Diagnosis not present

## 2019-04-04 DIAGNOSIS — L89892 Pressure ulcer of other site, stage 2: Secondary | ICD-10-CM | POA: Diagnosis not present

## 2019-04-05 ENCOUNTER — Other Ambulatory Visit: Payer: Self-pay

## 2019-04-05 ENCOUNTER — Encounter (HOSPITAL_COMMUNITY): Payer: Self-pay | Admitting: Emergency Medicine

## 2019-04-05 ENCOUNTER — Emergency Department (HOSPITAL_COMMUNITY): Payer: Medicare Other

## 2019-04-05 ENCOUNTER — Emergency Department (HOSPITAL_COMMUNITY)
Admission: EM | Admit: 2019-04-05 | Discharge: 2019-04-05 | Disposition: A | Discharge status: Home / Self Care | Payer: Medicare Other | Attending: Emergency Medicine | Admitting: Emergency Medicine

## 2019-04-05 DIAGNOSIS — Y929 Unspecified place or not applicable: Secondary | ICD-10-CM | POA: Diagnosis not present

## 2019-04-05 DIAGNOSIS — R079 Chest pain, unspecified: Secondary | ICD-10-CM | POA: Diagnosis not present

## 2019-04-05 DIAGNOSIS — Y999 Unspecified external cause status: Secondary | ICD-10-CM | POA: Diagnosis not present

## 2019-04-05 DIAGNOSIS — Z7901 Long term (current) use of anticoagulants: Secondary | ICD-10-CM | POA: Diagnosis not present

## 2019-04-05 DIAGNOSIS — S20212A Contusion of left front wall of thorax, initial encounter: Secondary | ICD-10-CM | POA: Diagnosis not present

## 2019-04-05 DIAGNOSIS — Z79899 Other long term (current) drug therapy: Secondary | ICD-10-CM | POA: Diagnosis not present

## 2019-04-05 DIAGNOSIS — X58XXXA Exposure to other specified factors, initial encounter: Secondary | ICD-10-CM | POA: Insufficient documentation

## 2019-04-05 DIAGNOSIS — I4811 Longstanding persistent atrial fibrillation: Secondary | ICD-10-CM | POA: Insufficient documentation

## 2019-04-05 DIAGNOSIS — S299XXA Unspecified injury of thorax, initial encounter: Secondary | ICD-10-CM | POA: Diagnosis present

## 2019-04-05 DIAGNOSIS — I5032 Chronic diastolic (congestive) heart failure: Secondary | ICD-10-CM | POA: Diagnosis not present

## 2019-04-05 DIAGNOSIS — Y939 Activity, unspecified: Secondary | ICD-10-CM | POA: Diagnosis not present

## 2019-04-05 DIAGNOSIS — I110 Hypertensive heart disease with heart failure: Secondary | ICD-10-CM | POA: Insufficient documentation

## 2019-04-05 DIAGNOSIS — R0789 Other chest pain: Secondary | ICD-10-CM | POA: Diagnosis not present

## 2019-04-05 DIAGNOSIS — R0689 Other abnormalities of breathing: Secondary | ICD-10-CM | POA: Diagnosis not present

## 2019-04-05 DIAGNOSIS — I10 Essential (primary) hypertension: Secondary | ICD-10-CM | POA: Diagnosis not present

## 2019-04-05 DIAGNOSIS — E785 Hyperlipidemia, unspecified: Secondary | ICD-10-CM | POA: Insufficient documentation

## 2019-04-05 DIAGNOSIS — R531 Weakness: Secondary | ICD-10-CM | POA: Diagnosis not present

## 2019-04-05 DIAGNOSIS — Z950 Presence of cardiac pacemaker: Secondary | ICD-10-CM | POA: Insufficient documentation

## 2019-04-05 LAB — PROTIME-INR
INR: 3.7 — ABNORMAL HIGH (ref 0.8–1.2)
Prothrombin Time: 35.9 s — ABNORMAL HIGH (ref 11.4–15.2)

## 2019-04-05 LAB — BASIC METABOLIC PANEL WITH GFR
Anion gap: 9 (ref 5–15)
BUN: 22 mg/dL (ref 8–23)
CO2: 27 mmol/L (ref 22–32)
Calcium: 9.2 mg/dL (ref 8.9–10.3)
Chloride: 98 mmol/L (ref 98–111)
Creatinine, Ser: 0.58 mg/dL (ref 0.44–1.00)
GFR calc Af Amer: >60 mL/min
GFR calc non Af Amer: >60 mL/min
Glucose, Bld: 145 mg/dL — ABNORMAL HIGH (ref 70–99)
Potassium: 4 mmol/L (ref 3.5–5.1)
Sodium: 134 mmol/L — ABNORMAL LOW (ref 135–145)

## 2019-04-05 LAB — CBC
HCT: 35.5 % — ABNORMAL LOW (ref 36.0–46.0)
Hemoglobin: 11.3 g/dL — ABNORMAL LOW (ref 12.0–15.0)
MCH: 29 pg (ref 26.0–34.0)
MCHC: 31.8 g/dL (ref 30.0–36.0)
MCV: 91.3 fL (ref 80.0–100.0)
Platelets: 250 10*3/uL (ref 150–400)
RBC: 3.89 MIL/uL (ref 3.87–5.11)
RDW: 13.4 % (ref 11.5–15.5)
WBC: 6.9 10*3/uL (ref 4.0–10.5)
nRBC: 0 % (ref 0.0–0.2)

## 2019-04-05 LAB — TROPONIN I (HIGH SENSITIVITY): Troponin I (High Sensitivity): 11 ng/L (ref <18)

## 2019-04-05 MED ORDER — IOHEXOL 300 MG/ML  SOLN
75.0000 mL | Freq: Once | INTRAMUSCULAR | Status: AC | PRN
  Administered 2019-04-05: 75 mL via INTRAVENOUS

## 2019-04-05 MED ORDER — HYDROCODONE-ACETAMINOPHEN 5-325 MG PO TABS: 1.0000 | ORAL_TABLET | Freq: Four times a day (QID) | ORAL | 0 refills | Status: AC | PRN

## 2019-04-05 MED ORDER — SODIUM CHLORIDE 0.9% FLUSH: 3.0000 mL | Freq: Once | INTRAVENOUS | Status: DC

## 2019-04-05 MED ORDER — HYDROCODONE-ACETAMINOPHEN 5-325 MG PO TABS
1.0000 | ORAL_TABLET | Freq: Once | ORAL | Status: AC
  Administered 2019-04-05: 22:00:00 1 via ORAL
  Filled 2019-04-05: qty 1

## 2019-04-05 NOTE — Telephone Encounter (Signed)
I have called [name] back and left and voicemail for him that he may call me back to discuss.

## 2019-04-05 NOTE — Discharge Instructions (Addendum)
Skip your next dose of coumadin (warfarin). I would have a repeat discussion with your family doctor with regards to discontinuing it. Stopping it would place you at higher risk for possibly having a stroke. Based on your age, sex, and history of hypertension there would be about a 4.8% chance of stroke per year. At your age, I question the utility in continuing it. My opinion is that your quality of life would be better if it was stopped. I want to skip your next dose regardless.

## 2019-04-05 NOTE — ED Provider Notes (Signed)
[facility] EMERGENCY DEPARTMENT Provider Note   CSN: [id number] Arrival date & time: 04/05/19  1813     History   Chief Complaint Chief Complaint  Patient presents with   Chest Pain    HPI [name] is a 85 y.o. female.     HPI   85 year old female with left chest/left shoulder yesterday.  History is supplemented by granddaughter at bedside.  Patient has been consistently the pain since yesterday.  The other associated symptoms such as dyspnea, nausea or diaphoresis.  Pain is worse with movement of her shoulder. denies any trauma or strain. no fevers or chills.  Past Medical History:  Diagnosis Date   Atrial fibrillation ([place])    Carotid bruit    Cataract    Colon disorder    Adhesions   Heart failure, diastolic (HCC)    Hyperlipidemia    statin intolerant   Hypertension    Mitral regurgitation    Moderate   Nephrolithiasis    Ovarian cyst    QT prolongation    With solatol   Restless leg syndrome    S/P AV nodal ablation     Patient Active Problem List   Diagnosis Date Noted   Iron deficiency anemia 06/01/2017   Permanent atrial fibrillation 03/17/2017   Abdominal aortic atherosclerosis ([place]) 05/29/2016   Ventral hernia 12/08/2013   Vitamin D deficiency 12/08/2013   Breast cancer ([place]) 12/08/2013   Long term current use of anticoagulant therapy 05/05/2013   Shortness of breath 05/02/2013   Restless leg syndrome 10/12/2010   CAROTID BRUIT 08/21/2010   MITRAL REGURGITATION 08/01/2009   HYPERTENSION, BENIGN 08/17/2008   AV BLOCK, COMPLETE 08/17/2008   ATRIAL FIBRILLATION 08/17/2008   PACEMAKER, PERMANENT 08/17/2008    Past Surgical History:  Procedure Laterality Date   ABDOMINAL ADHESION SURGERY     twice   BREAST SURGERY Left 2010   lumpectomy   CATARACT EXTRACTION     Bilaterally   COLON SURGERY     Colonic obstruction secondary to diverticulitis with resection   CYSTECTOMY     Ovarian   EYE SURGERY      JOINT REPLACEMENT Right 2004   hip   PACEMAKER INSERTION     [employer]   PERMANENT PACEMAKER GENERATOR CHANGE N/A 07/22/2012   Procedure: PERMANENT PACEMAKER GENERATOR CHANGE;  Surgeon: Deboraha Sprang, MD;  Location: [facility] CATH LAB;  Service: Cardiovascular;  Laterality: N/A;     OB History    Gravida  1   Para  1   Term  1   Preterm      AB      Living  0     SAB      TAB      Ectopic      Multiple      Live Births               Home Medications    Prior to Admission medications   Medication Sig Start Date End Date Taking? Authorizing Provider  b complex vitamins tablet Take 1 tablet by mouth daily.     [provider]  Cholecalciferol (VITAMIN D3) 1000 UNITS tablet Take 2,000 Units by mouth daily.      [provider]  Fe Fum-FePoly-Vit C-Vit B3 (INTEGRA) 62.5-62.5-40-3 MG CAPS TAKE ONE CAPSULE BY MOUTH EVERY DAILY 11/03/18   Chipper Herb, MD  furosemide (LASIX) 20 MG tablet Take 1 tablet (20 mg total) by mouth daily as needed. 12/08/18  Chipper Herb, MD  ipratropium (ATROVENT) 0.06 % nasal spray Place 2 sprays into the nose 3 (three) times daily. 08/14/17   [provider]  lactose free nutrition (BOOST) LIQD Take 237 mLs by mouth daily. 12/18/16   Chipper Herb, MD  Melatonin 3 MG TABS Take 1 tablet by mouth at bedtime as needed.    [provider]  polyethylene glycol powder (GLYCOLAX/MIRALAX) 17 GM/SCOOP powder TAKE 17 GRAMS (1 CAPFUL) IN 8 OZ OF FLUID TWICE DAILY 03/22/19   Rakes, Connye Burkitt, FNP  vitamin C (ASCORBIC ACID) 500 MG tablet Take 500 mg by mouth daily.      [provider]  vitamin E 400 UNIT capsule Take 400 Units by mouth daily.    [provider]  warfarin (COUMADIN) 5 MG tablet TAKE 1/2 OR 1 TABLET BY MOUTH ONCE A DAY OR AS INSTRUCTED BY COUMADIN ANTICOAGULATION CLINIC 02/04/19   Sharion Balloon, FNP    Family History Family History  Problem Relation Age of Onset   Diabetes Mother         type 2   Diabetes Father        type 2   Cancer Sister 47       colon    Social History Social History   Tobacco Use   Smoking status: Never Smoker   Smokeless tobacco: Never Used  Substance Use Topics   Alcohol use: No   Drug use: No     Allergies   Atorvastatin, Boniva [ibandronate sodium], Chocolate, Lovastatin, Pravastatin sodium, Risedronate sodium, Toprol xl [metoprolol succinate], Visken [pindolol], Welchol [colesevelam hcl], and Vesicare [solifenacin]   Review of Systems Review of Systems  All systems reviewed and negative, other than as noted in HPI.  Physical Exam Updated Vital Signs BP 108/73 (BP Location: Right Arm)    Pulse 71    Temp 97.7 F (36.5 C) (Oral)    Resp (!) 25    Wt 65.8 kg    SpO2 96%    BMI 22.72 kg/m   Physical Exam Vitals signs and nursing note reviewed.  Constitutional:      General: She is not in acute distress.    Appearance: She is well-developed.  HENT:     Head: Normocephalic and atraumatic.  Eyes:     General:        Right eye: No discharge.        Left eye: No discharge.     Conjunctiva/sclera: Conjunctivae normal.  Neck:     Musculoskeletal: Neck supple.  Cardiovascular:     Rate and Rhythm: Normal rate and regular rhythm.     Heart sounds: Normal heart sounds. No murmur. No friction rub. No gallop.   Pulmonary:     Effort: Pulmonary effort is normal. No respiratory distress.     Breath sounds: Normal breath sounds.     Comments: TTP and mild swelling from pacemaker pocket extending inferiorly/laterally. Suspect hematoma. No cellulitic changes. No skin break down.  Chest:     Chest wall: Tenderness present.  Abdominal:     General: There is no distension.     Palpations: Abdomen is soft.     Tenderness: There is no abdominal tenderness.  Musculoskeletal:        General: No tenderness.  Skin:    General: Skin is warm and dry.  Neurological:     Mental Status: She is alert.  Psychiatric:         Behavior: Behavior normal.  Thought Content: Thought content normal.      ED Treatments / Results  Labs (all labs ordered are listed, but only abnormal results are displayed) Labs Reviewed  BASIC METABOLIC PANEL - Abnormal; Notable for the following components:      Result Value   Sodium 134 (*)    Glucose, Bld 145 (*)    All other components within normal limits  CBC - Abnormal; Notable for the following components:   Hemoglobin 11.3 (*)    HCT 35.5 (*)    All other components within normal limits  PROTIME-INR - Abnormal; Notable for the following components:   Prothrombin Time 35.9 (*)    INR 3.7 (*)    All other components within normal limits  TROPONIN I (HIGH SENSITIVITY)  TROPONIN I (HIGH SENSITIVITY)    EKG None  Radiology Ct Chest W Contrast  Result Date: 04/05/2019 CLINICAL DATA:  Left chest wall pain. Tenderness and mass extending from pacemaker. On Coumadin. Question hematoma. EXAM: CT CHEST WITH CONTRAST TECHNIQUE: Multidetector CT imaging of the chest was performed during intravenous contrast administration. CONTRAST:  49mL OMNIPAQUE IOHEXOL 300 MG/ML  SOLN COMPARISON:  None. FINDINGS: Cardiovascular: Cardiomegaly. Coronary artery and aortic calcifications. No evidence of aortic aneurysm. No filling defects in the pulmonary arteries to suggest pulmonary emboli. Mediastinum/Nodes: No mediastinal, hilar, or axillary adenopathy. Trachea and esophagus are unremarkable. Thyroid unremarkable. Lungs/Pleura: Biapical scarring. Bibasilar atelectasis. Small bilateral effusions. Upper Abdomen: Imaging into the upper abdomen shows no acute findings. Gallstones seen within the gallbladder. Musculoskeletal: There is a chest wall hematoma noted deep 2 and inferior and lateral to left pacer battery pack. This measures approximately 15.8 x 3.4 cm on axial image 72 series 2. This extends over a craniocaudal length of approximately 12 cm on coronal image 31, series 6. No acute bony  abnormality. Degenerative changes in the thoracic spine. IMPRESSION: Left chest wall hematoma deep 2 and inferior and lateral to the left pacer battery pack measuring 15.8 x 12 x 3.4 cm. Cardiomegaly. Coronary artery disease. Small bilateral effusions with bibasilar atelectasis. Aortic Atherosclerosis (ICD10-I70.0). Electronically Signed   By: Rolm Baptise M.D.   On: 04/05/2019 20:45    Procedures Procedures (including critical care time)  Medications Ordered in ED Medications  sodium chloride flush (NS) 0.9 % injection 3 mL (has no administration in time range)     Initial Impression / Assessment and Plan / ED Course  I have reviewed the triage vital signs and the nursing notes.  Pertinent labs & imaging results that were available during my care of the patient were reviewed by me and considered in my medical decision making (see chart for details).        89yf with L chest pain. From chest wall hematoma. On coumadin for hx of afib. INR 3.7. Discussed with granddaughter. I think benefits of continuing coumadin outweighed by other risks at this point, particularly at the age of 49. Advised that needs to at least hold next dose with elevated INR and to think about it further. FU with PCP.   Final Clinical Impressions(s) / ED Diagnoses   Final diagnoses:  Hematoma of left chest wall, initial encounter    ED Discharge Orders    None       Virgel Manifold, MD 04/10/19 1310

## 2019-04-05 NOTE — ED Triage Notes (Signed)
Per EMS patient from home. Complains of chest pain. EMS states elevated BP 167/92. Patient was given one nitro with no change in BP.

## 2019-04-06 ENCOUNTER — Telehealth: Payer: Self-pay | Admitting: *Deleted

## 2019-04-06 DIAGNOSIS — I4821 Permanent atrial fibrillation: Secondary | ICD-10-CM | POA: Diagnosis not present

## 2019-04-06 DIAGNOSIS — Z7401 Bed confinement status: Secondary | ICD-10-CM

## 2019-04-06 DIAGNOSIS — R54 Age-related physical debility: Secondary | ICD-10-CM

## 2019-04-06 DIAGNOSIS — I340 Nonrheumatic mitral (valve) insufficiency: Secondary | ICD-10-CM | POA: Diagnosis not present

## 2019-04-06 DIAGNOSIS — L97911 Non-pressure chronic ulcer of unspecified part of right lower leg limited to breakdown of skin: Secondary | ICD-10-CM

## 2019-04-06 DIAGNOSIS — L89892 Pressure ulcer of other site, stage 2: Secondary | ICD-10-CM | POA: Diagnosis not present

## 2019-04-06 DIAGNOSIS — G2581 Restless legs syndrome: Secondary | ICD-10-CM | POA: Diagnosis not present

## 2019-04-06 DIAGNOSIS — I110 Hypertensive heart disease with heart failure: Secondary | ICD-10-CM | POA: Diagnosis not present

## 2019-04-06 DIAGNOSIS — I5030 Unspecified diastolic (congestive) heart failure: Secondary | ICD-10-CM | POA: Diagnosis not present

## 2019-04-06 NOTE — Telephone Encounter (Signed)
VM from [place] [facility] w/ [facility] order/Rx for [facility] lift and hospital bed for pt Please advise

## 2019-04-06 NOTE — Telephone Encounter (Signed)
I am fine with that. Is it possible to get this patient an appointment scheduled with me with her POA? She is new to me. I have never met her.

## 2019-04-07 ENCOUNTER — Telehealth: Payer: Self-pay | Admitting: *Deleted

## 2019-04-07 DIAGNOSIS — I110 Hypertensive heart disease with heart failure: Secondary | ICD-10-CM | POA: Diagnosis not present

## 2019-04-07 DIAGNOSIS — I4821 Permanent atrial fibrillation: Secondary | ICD-10-CM | POA: Diagnosis not present

## 2019-04-07 DIAGNOSIS — I5030 Unspecified diastolic (congestive) heart failure: Secondary | ICD-10-CM | POA: Diagnosis not present

## 2019-04-07 DIAGNOSIS — L89892 Pressure ulcer of other site, stage 2: Secondary | ICD-10-CM | POA: Diagnosis not present

## 2019-04-07 DIAGNOSIS — G2581 Restless legs syndrome: Secondary | ICD-10-CM | POA: Diagnosis not present

## 2019-04-07 DIAGNOSIS — I340 Nonrheumatic mitral (valve) insufficiency: Secondary | ICD-10-CM | POA: Diagnosis not present

## 2019-04-07 NOTE — Telephone Encounter (Signed)
TC from [place] w/ [facility] Did not have have strips to do mdINR home testing [place] did INR 4.0 Pt did not take dose yesterday Went to ED on 04/05/19, instructed not to take Wed, and receommended to stop altogether Normal dosing 5 mg daily, 1/2 M and F  Pt has bruise on L breast, swelling L upper chest midlint to armpit area, bruise L knuckle about size of 50 cent piece

## 2019-04-07 NOTE — Telephone Encounter (Signed)
I have spoken to Alanson Aly, patient's POA. We discussed risks and benefits of coumadin therapy. He understands patient's risks outweigh the benefits of coumadin at this time. He is agreeable to her stopping the coumadin. He is making caregivers aware so that they no longer give it to her.   Patient is unable to get to the office for an in-person office visit.

## 2019-04-07 NOTE — Telephone Encounter (Signed)
Addressed in different telephone encounter

## 2019-04-07 NOTE — Telephone Encounter (Signed)
D/C order placed for INR machine. Company will come and pick up. Caregiver notified. Also scheduled a video visit for this Tuesday to establish care.

## 2019-04-07 NOTE — Telephone Encounter (Signed)
Going ahead with orders, faxing to [facility]

## 2019-04-07 NOTE — Addendum Note (Signed)
Addended by: Loman Brooklyn on: 04/07/2019 02:43 PM   Modules accepted: Orders

## 2019-04-07 NOTE — Telephone Encounter (Signed)
It is Alanson Aly and his number is [phone]

## 2019-04-07 NOTE — Telephone Encounter (Signed)
I need to speak to this patient's POA. His # is not on the chart. Is there a way to get in touch with him?

## 2019-04-08 NOTE — Addendum Note (Signed)
Addended by: Antonietta Barcelona D on: 04/08/2019 11:49 AM   Modules accepted: Orders

## 2019-04-11 DIAGNOSIS — I340 Nonrheumatic mitral (valve) insufficiency: Secondary | ICD-10-CM | POA: Diagnosis not present

## 2019-04-11 DIAGNOSIS — G2581 Restless legs syndrome: Secondary | ICD-10-CM | POA: Diagnosis not present

## 2019-04-11 DIAGNOSIS — I4821 Permanent atrial fibrillation: Secondary | ICD-10-CM | POA: Diagnosis not present

## 2019-04-11 DIAGNOSIS — I5030 Unspecified diastolic (congestive) heart failure: Secondary | ICD-10-CM | POA: Diagnosis not present

## 2019-04-11 DIAGNOSIS — I110 Hypertensive heart disease with heart failure: Secondary | ICD-10-CM | POA: Diagnosis not present

## 2019-04-11 DIAGNOSIS — L89892 Pressure ulcer of other site, stage 2: Secondary | ICD-10-CM | POA: Diagnosis not present

## 2019-04-12 ENCOUNTER — Other Ambulatory Visit: Payer: Self-pay

## 2019-04-12 ENCOUNTER — Telehealth (INDEPENDENT_AMBULATORY_CARE_PROVIDER_SITE_OTHER): Payer: Medicare Other | Admitting: Family Medicine

## 2019-04-12 ENCOUNTER — Encounter: Payer: Self-pay | Admitting: Family Medicine

## 2019-04-12 DIAGNOSIS — L8991 Pressure ulcer of unspecified site, stage 1: Secondary | ICD-10-CM | POA: Diagnosis not present

## 2019-04-12 DIAGNOSIS — Z7401 Bed confinement status: Secondary | ICD-10-CM | POA: Diagnosis not present

## 2019-04-12 DIAGNOSIS — R531 Weakness: Secondary | ICD-10-CM

## 2019-04-12 DIAGNOSIS — T148XXA Other injury of unspecified body region, initial encounter: Secondary | ICD-10-CM | POA: Diagnosis not present

## 2019-04-12 DIAGNOSIS — R195 Other fecal abnormalities: Secondary | ICD-10-CM | POA: Diagnosis not present

## 2019-04-12 DIAGNOSIS — R54 Age-related physical debility: Secondary | ICD-10-CM | POA: Diagnosis not present

## 2019-04-12 DIAGNOSIS — R627 Adult failure to thrive: Secondary | ICD-10-CM

## 2019-04-12 DIAGNOSIS — I4821 Permanent atrial fibrillation: Secondary | ICD-10-CM

## 2019-04-12 MED ORDER — INTEGRA 62.5-62.5-40-3 MG PO CAPS: ORAL_CAPSULE | ORAL | 3 refills | Status: AC

## 2019-04-12 NOTE — Progress Notes (Signed)
Virtual Visit via Telephone Note  I connected with [name] on 04/12/19 at 10:51 AM by telephone and verified that I am speaking with the correct person using two identifiers. [name] is currently located at home and her caregiver and her granddaughter are currently with her during this visit. The provider, Loman Brooklyn, FNP is located in their home at time of visit.  I discussed the limitations, risks, security and privacy concerns of performing an evaluation and management service by telephone and the availability of in person appointments. I also discussed with the patient that there may be a patient responsible charge related to this service. The patient expressed understanding and agreed to proceed.  Subjective: PCP: Loman Brooklyn, FNP  Chief Complaint  Patient presents with   Establish Care   Patient was seen in the ER on 04/05/2019 due to a hematoma of the left side of her chest. She has not had any recent falls. She was previously taking coumadin and was having supratherapeutic levels. The coumadin was officially discontinued 04/07/2019. She had recently started using a stand-up lift which the granddaughter, [name], believes lead to the bruising combined with the supratherapeutic coumadin levels. The patient's granddaughter reports [name] has not opened her eyes since she came home from the ER. She is bedbound. She does not say much but will verbalize if she is in pain. They quit giving her Norco three days ago as they felt that may having something to do with her being less responsive; she has only been taking Tylenol for pain since then. There has not been a change in responsiveness since stopping the Norco. The granddaughter and caregiver agree that she is in pain when she is repositioned which is why they give the Tylenol. They also report it is getting harder and harder to reposition patient with just one person. She does still take her medications, eat, and  drink. The caregiver reports PT came yesterday and sat [name] up on the side of the bed for about 10 minutes but states they did everything, she did not participate or even try to hold herself up. The caregiver also reports she has an area on her backside that started to turn a little red. They have been applying barrier cream. A hospital bed and hoyer lift were ordered last week.   I then called patient's grandson, [name], who is her POA. He informed me that the patient has been having dark stools. Also states ever since patient's daughter passed away in Nov 18, 2016 she lost her desire to live and has been declining since then.    ROS: Per HPI  Current Outpatient Medications:    b complex vitamins tablet, Take 1 tablet by mouth daily. , Disp: , Rfl:    Cholecalciferol (VITAMIN D3) 1000 UNITS tablet, Take 2,000 Units by mouth daily.  , Disp: , Rfl:    Fe Fum-FePoly-Vit C-Vit B3 (INTEGRA) 62.5-62.5-40-3 MG CAPS, TAKE ONE CAPSULE BY MOUTH EVERY DAILY, Disp: 90 capsule, Rfl: 3   HYDROcodone-acetaminophen (NORCO/VICODIN) 5-325 MG tablet, Take 1 tablet by mouth every 6 (six) hours as needed., Disp: 12 tablet, Rfl: 0   lactose free nutrition (BOOST) LIQD, Take 237 mLs by mouth daily., Disp: , Rfl: 0   Melatonin 3 MG TABS, Take 1 tablet by mouth at bedtime as needed., Disp: , Rfl:    polyethylene glycol powder (GLYCOLAX/MIRALAX) 17 GM/SCOOP powder, TAKE 17 GRAMS (1 CAPFUL) IN 8 OZ OF FLUID TWICE DAILY (Patient taking  differently: Take 17 g by mouth 2 (two) times daily. ), Disp: 850 g, Rfl: 0   vitamin C (ASCORBIC ACID) 500 MG tablet, Take 500 mg by mouth daily.  , Disp: , Rfl:    vitamin E 400 UNIT capsule, Take 400 Units by mouth daily., Disp: , Rfl:   Allergies  Allergen Reactions   Atorvastatin     unknown   Boniva [Ibandronate Sodium]     unknown   Chocolate     unknown   Lovastatin     unknown   Pravastatin Sodium     unknown   Risedronate Sodium     unknown   Toprol Xl  [Metoprolol Succinate]     unknown   Visken [Pindolol]     unknown   Welchol [Colesevelam Hcl]     unknown   Vesicare [Solifenacin] Anxiety and Other (See Comments)    lightheaded   Past Medical History:  Diagnosis Date   Atrial fibrillation (HCC)    Carotid bruit    Cataract    Colon disorder    Adhesions   Heart failure, diastolic (HCC)    Hyperlipidemia    statin intolerant   Hypertension    Mitral regurgitation    Moderate   Nephrolithiasis    Ovarian cyst    QT prolongation    With solatol   Restless leg syndrome    S/P AV nodal ablation     Observations/Objective: Patient was lying in bed during our visit. She did not say anything. She does have extensive bruising across her chest and to her right breast.  No respiratory distress or wheezing audible. No grimacing noted.   Assessment and Plan: 1. Bedridden - Encouraged to reposition every two hours. Hospital bed and hoyer lift have been ordered.  - Ambulatory referral to Hospice  2. Pressure injury, stage 1, unspecified location - Continue applying barrier cream. Reposition every two hours.  - Ambulatory referral to Hospice  3. Frailty syndrome in geriatric patient - Ambulatory referral to Hospice  4. Failure to thrive in adult - Ambulatory referral to Hospice  5. General weakness - Hospital bed and hoyer lift have been ordered.  - Ambulatory referral to Hospice  6. Permanent atrial fibrillation - Patient has been taken off coumadin as her risks outweighed her benefits.  - Ambulatory referral to Hospice  7. Hematoma - Patient has been taken off coumadin as her risks outweighed her benefits.  - Ambulatory referral to Hospice  8. Bruising - Patient has been taken off coumadin as her risks outweighed her benefits.  - Ambulatory referral to Hospice  9. Dark stools - Patient is on iron supplement. Discussed with POA that if she did have a GI bleed this would mean a referral to GI and a  colonoscopy, which they do not wish to do.  - Ambulatory referral to Hospice  I discussed Hospice referral with both the granddaughter [name]) and the grandson Vicente Males, [place]), both are agreeable. [name] lives the closest and could be present for the assessment and her # is [phone]; Vicente Males travels for work and therefore does not go to the house due to the current pandemic. He can be reached at [phone].  Follow Up Instructions:  I discussed the assessment and treatment plan with the patient. The patient was provided an opportunity to ask questions and all were answered. The patient agreed with the plan and demonstrated an understanding of the instructions.   The patient was advised to call back  or seek an in-person evaluation if the symptoms worsen or if the condition fails to improve as anticipated.  The above assessment and management plan was discussed with the patient. The patient verbalized understanding of and has agreed to the management plan. Patient is aware to call the clinic if symptoms persist or worsen. Patient is aware when to return to the clinic for a follow-up visit. Patient educated on when it is appropriate to go to the emergency department.   Time call ended: 11:11 AM  I provided 30 minutes of non-face-to-face time during this encounter.  Hendricks Limes, MSN, APRN, FNP-C [facility] 04/12/19

## 2019-04-13 ENCOUNTER — Ambulatory Visit (INDEPENDENT_AMBULATORY_CARE_PROVIDER_SITE_OTHER): Payer: Medicare Other

## 2019-04-13 ENCOUNTER — Other Ambulatory Visit: Payer: Self-pay

## 2019-04-13 ENCOUNTER — Encounter: Payer: Self-pay | Admitting: Family Medicine

## 2019-04-13 DIAGNOSIS — I340 Nonrheumatic mitral (valve) insufficiency: Secondary | ICD-10-CM | POA: Diagnosis not present

## 2019-04-13 DIAGNOSIS — G2581 Restless legs syndrome: Secondary | ICD-10-CM

## 2019-04-13 DIAGNOSIS — M6281 Muscle weakness (generalized): Secondary | ICD-10-CM | POA: Diagnosis not present

## 2019-04-13 DIAGNOSIS — I110 Hypertensive heart disease with heart failure: Secondary | ICD-10-CM

## 2019-04-13 DIAGNOSIS — Z7901 Long term (current) use of anticoagulants: Secondary | ICD-10-CM | POA: Diagnosis not present

## 2019-04-13 DIAGNOSIS — Z950 Presence of cardiac pacemaker: Secondary | ICD-10-CM | POA: Diagnosis not present

## 2019-04-13 DIAGNOSIS — I4821 Permanent atrial fibrillation: Secondary | ICD-10-CM

## 2019-04-13 DIAGNOSIS — L89892 Pressure ulcer of other site, stage 2: Secondary | ICD-10-CM

## 2019-04-13 DIAGNOSIS — R32 Unspecified urinary incontinence: Secondary | ICD-10-CM | POA: Diagnosis not present

## 2019-04-13 DIAGNOSIS — Z7401 Bed confinement status: Secondary | ICD-10-CM | POA: Diagnosis not present

## 2019-04-13 DIAGNOSIS — I5030 Unspecified diastolic (congestive) heart failure: Secondary | ICD-10-CM | POA: Diagnosis not present

## 2019-04-14 DIAGNOSIS — I110 Hypertensive heart disease with heart failure: Secondary | ICD-10-CM | POA: Diagnosis not present

## 2019-04-14 DIAGNOSIS — L89892 Pressure ulcer of other site, stage 2: Secondary | ICD-10-CM | POA: Diagnosis not present

## 2019-04-14 DIAGNOSIS — I4821 Permanent atrial fibrillation: Secondary | ICD-10-CM | POA: Diagnosis not present

## 2019-04-14 DIAGNOSIS — I5030 Unspecified diastolic (congestive) heart failure: Secondary | ICD-10-CM | POA: Diagnosis not present

## 2019-04-14 DIAGNOSIS — I340 Nonrheumatic mitral (valve) insufficiency: Secondary | ICD-10-CM | POA: Diagnosis not present

## 2019-04-14 DIAGNOSIS — G2581 Restless legs syndrome: Secondary | ICD-10-CM | POA: Diagnosis not present

## 2019-04-15 DIAGNOSIS — I10 Essential (primary) hypertension: Secondary | ICD-10-CM | POA: Diagnosis not present

## 2019-04-15 DIAGNOSIS — Z950 Presence of cardiac pacemaker: Secondary | ICD-10-CM | POA: Diagnosis not present

## 2019-04-15 DIAGNOSIS — R52 Pain, unspecified: Secondary | ICD-10-CM | POA: Diagnosis not present

## 2019-04-15 DIAGNOSIS — G2581 Restless legs syndrome: Secondary | ICD-10-CM | POA: Diagnosis not present

## 2019-04-15 DIAGNOSIS — Z853 Personal history of malignant neoplasm of breast: Secondary | ICD-10-CM | POA: Diagnosis not present

## 2019-04-15 DIAGNOSIS — K5900 Constipation, unspecified: Secondary | ICD-10-CM | POA: Diagnosis not present

## 2019-04-15 DIAGNOSIS — I4891 Unspecified atrial fibrillation: Secondary | ICD-10-CM | POA: Diagnosis not present

## 2019-04-15 DIAGNOSIS — I519 Heart disease, unspecified: Secondary | ICD-10-CM | POA: Diagnosis not present

## 2019-04-15 DIAGNOSIS — E785 Hyperlipidemia, unspecified: Secondary | ICD-10-CM | POA: Diagnosis not present

## 2019-04-15 DIAGNOSIS — R531 Weakness: Secondary | ICD-10-CM | POA: Diagnosis not present

## 2019-04-15 DIAGNOSIS — I509 Heart failure, unspecified: Secondary | ICD-10-CM | POA: Diagnosis not present

## 2019-04-15 DIAGNOSIS — R0602 Shortness of breath: Secondary | ICD-10-CM | POA: Diagnosis not present

## 2019-04-18 DIAGNOSIS — E785 Hyperlipidemia, unspecified: Secondary | ICD-10-CM | POA: Diagnosis not present

## 2019-04-18 DIAGNOSIS — I519 Heart disease, unspecified: Secondary | ICD-10-CM | POA: Diagnosis not present

## 2019-04-18 DIAGNOSIS — G2581 Restless legs syndrome: Secondary | ICD-10-CM | POA: Diagnosis not present

## 2019-04-18 DIAGNOSIS — I509 Heart failure, unspecified: Secondary | ICD-10-CM | POA: Diagnosis not present

## 2019-04-18 DIAGNOSIS — I4891 Unspecified atrial fibrillation: Secondary | ICD-10-CM | POA: Diagnosis not present

## 2019-04-18 DIAGNOSIS — I10 Essential (primary) hypertension: Secondary | ICD-10-CM | POA: Diagnosis not present

## 2019-04-20 DIAGNOSIS — I10 Essential (primary) hypertension: Secondary | ICD-10-CM | POA: Diagnosis not present

## 2019-04-20 DIAGNOSIS — I4891 Unspecified atrial fibrillation: Secondary | ICD-10-CM | POA: Diagnosis not present

## 2019-04-20 DIAGNOSIS — I519 Heart disease, unspecified: Secondary | ICD-10-CM | POA: Diagnosis not present

## 2019-04-20 DIAGNOSIS — G2581 Restless legs syndrome: Secondary | ICD-10-CM | POA: Diagnosis not present

## 2019-04-20 DIAGNOSIS — I509 Heart failure, unspecified: Secondary | ICD-10-CM | POA: Diagnosis not present

## 2019-04-20 DIAGNOSIS — E785 Hyperlipidemia, unspecified: Secondary | ICD-10-CM | POA: Diagnosis not present

## 2019-04-22 DIAGNOSIS — G2581 Restless legs syndrome: Secondary | ICD-10-CM | POA: Diagnosis not present

## 2019-04-22 DIAGNOSIS — I10 Essential (primary) hypertension: Secondary | ICD-10-CM | POA: Diagnosis not present

## 2019-04-22 DIAGNOSIS — I519 Heart disease, unspecified: Secondary | ICD-10-CM | POA: Diagnosis not present

## 2019-04-22 DIAGNOSIS — I4891 Unspecified atrial fibrillation: Secondary | ICD-10-CM | POA: Diagnosis not present

## 2019-04-22 DIAGNOSIS — I509 Heart failure, unspecified: Secondary | ICD-10-CM | POA: Diagnosis not present

## 2019-04-22 DIAGNOSIS — E785 Hyperlipidemia, unspecified: Secondary | ICD-10-CM | POA: Diagnosis not present

## 2019-04-25 DIAGNOSIS — E785 Hyperlipidemia, unspecified: Secondary | ICD-10-CM | POA: Diagnosis not present

## 2019-04-25 DIAGNOSIS — I519 Heart disease, unspecified: Secondary | ICD-10-CM | POA: Diagnosis not present

## 2019-04-25 DIAGNOSIS — G2581 Restless legs syndrome: Secondary | ICD-10-CM | POA: Diagnosis not present

## 2019-04-25 DIAGNOSIS — I4891 Unspecified atrial fibrillation: Secondary | ICD-10-CM | POA: Diagnosis not present

## 2019-04-25 DIAGNOSIS — I509 Heart failure, unspecified: Secondary | ICD-10-CM | POA: Diagnosis not present

## 2019-04-25 DIAGNOSIS — I10 Essential (primary) hypertension: Secondary | ICD-10-CM | POA: Diagnosis not present

## 2019-04-27 DIAGNOSIS — I519 Heart disease, unspecified: Secondary | ICD-10-CM | POA: Diagnosis not present

## 2019-04-27 DIAGNOSIS — G2581 Restless legs syndrome: Secondary | ICD-10-CM | POA: Diagnosis not present

## 2019-04-27 DIAGNOSIS — I509 Heart failure, unspecified: Secondary | ICD-10-CM | POA: Diagnosis not present

## 2019-04-27 DIAGNOSIS — I4891 Unspecified atrial fibrillation: Secondary | ICD-10-CM | POA: Diagnosis not present

## 2019-04-27 DIAGNOSIS — I10 Essential (primary) hypertension: Secondary | ICD-10-CM | POA: Diagnosis not present

## 2019-04-27 DIAGNOSIS — E785 Hyperlipidemia, unspecified: Secondary | ICD-10-CM | POA: Diagnosis not present

## 2019-04-28 DIAGNOSIS — I4891 Unspecified atrial fibrillation: Secondary | ICD-10-CM | POA: Diagnosis not present

## 2019-04-28 DIAGNOSIS — G2581 Restless legs syndrome: Secondary | ICD-10-CM | POA: Diagnosis not present

## 2019-04-28 DIAGNOSIS — K5900 Constipation, unspecified: Secondary | ICD-10-CM | POA: Diagnosis not present

## 2019-04-28 DIAGNOSIS — R531 Weakness: Secondary | ICD-10-CM | POA: Diagnosis not present

## 2019-04-28 DIAGNOSIS — I519 Heart disease, unspecified: Secondary | ICD-10-CM | POA: Diagnosis not present

## 2019-04-28 DIAGNOSIS — I509 Heart failure, unspecified: Secondary | ICD-10-CM | POA: Diagnosis not present

## 2019-04-28 DIAGNOSIS — E785 Hyperlipidemia, unspecified: Secondary | ICD-10-CM | POA: Diagnosis not present

## 2019-04-28 DIAGNOSIS — R52 Pain, unspecified: Secondary | ICD-10-CM | POA: Diagnosis not present

## 2019-04-28 DIAGNOSIS — Z853 Personal history of malignant neoplasm of breast: Secondary | ICD-10-CM | POA: Diagnosis not present

## 2019-04-28 DIAGNOSIS — R0602 Shortness of breath: Secondary | ICD-10-CM | POA: Diagnosis not present

## 2019-04-28 DIAGNOSIS — Z950 Presence of cardiac pacemaker: Secondary | ICD-10-CM | POA: Diagnosis not present

## 2019-04-28 DIAGNOSIS — I10 Essential (primary) hypertension: Secondary | ICD-10-CM | POA: Diagnosis not present

## 2019-04-29 DIAGNOSIS — G2581 Restless legs syndrome: Secondary | ICD-10-CM | POA: Diagnosis not present

## 2019-04-29 DIAGNOSIS — I509 Heart failure, unspecified: Secondary | ICD-10-CM | POA: Diagnosis not present

## 2019-04-29 DIAGNOSIS — E785 Hyperlipidemia, unspecified: Secondary | ICD-10-CM | POA: Diagnosis not present

## 2019-04-29 DIAGNOSIS — I10 Essential (primary) hypertension: Secondary | ICD-10-CM | POA: Diagnosis not present

## 2019-04-29 DIAGNOSIS — I519 Heart disease, unspecified: Secondary | ICD-10-CM | POA: Diagnosis not present

## 2019-04-29 DIAGNOSIS — I4891 Unspecified atrial fibrillation: Secondary | ICD-10-CM | POA: Diagnosis not present

## 2019-05-02 DIAGNOSIS — I4891 Unspecified atrial fibrillation: Secondary | ICD-10-CM | POA: Diagnosis not present

## 2019-05-02 DIAGNOSIS — I10 Essential (primary) hypertension: Secondary | ICD-10-CM | POA: Diagnosis not present

## 2019-05-02 DIAGNOSIS — E785 Hyperlipidemia, unspecified: Secondary | ICD-10-CM | POA: Diagnosis not present

## 2019-05-02 DIAGNOSIS — G2581 Restless legs syndrome: Secondary | ICD-10-CM | POA: Diagnosis not present

## 2019-05-02 DIAGNOSIS — I509 Heart failure, unspecified: Secondary | ICD-10-CM | POA: Diagnosis not present

## 2019-05-02 DIAGNOSIS — I519 Heart disease, unspecified: Secondary | ICD-10-CM | POA: Diagnosis not present

## 2019-05-04 DIAGNOSIS — E785 Hyperlipidemia, unspecified: Secondary | ICD-10-CM | POA: Diagnosis not present

## 2019-05-04 DIAGNOSIS — G2581 Restless legs syndrome: Secondary | ICD-10-CM | POA: Diagnosis not present

## 2019-05-04 DIAGNOSIS — I4891 Unspecified atrial fibrillation: Secondary | ICD-10-CM | POA: Diagnosis not present

## 2019-05-04 DIAGNOSIS — I509 Heart failure, unspecified: Secondary | ICD-10-CM | POA: Diagnosis not present

## 2019-05-04 DIAGNOSIS — I10 Essential (primary) hypertension: Secondary | ICD-10-CM | POA: Diagnosis not present

## 2019-05-04 DIAGNOSIS — I519 Heart disease, unspecified: Secondary | ICD-10-CM | POA: Diagnosis not present

## 2019-05-05 DIAGNOSIS — I519 Heart disease, unspecified: Secondary | ICD-10-CM | POA: Diagnosis not present

## 2019-05-05 DIAGNOSIS — I509 Heart failure, unspecified: Secondary | ICD-10-CM | POA: Diagnosis not present

## 2019-05-05 DIAGNOSIS — I10 Essential (primary) hypertension: Secondary | ICD-10-CM | POA: Diagnosis not present

## 2019-05-05 DIAGNOSIS — E785 Hyperlipidemia, unspecified: Secondary | ICD-10-CM | POA: Diagnosis not present

## 2019-05-05 DIAGNOSIS — G2581 Restless legs syndrome: Secondary | ICD-10-CM | POA: Diagnosis not present

## 2019-05-05 DIAGNOSIS — I4891 Unspecified atrial fibrillation: Secondary | ICD-10-CM | POA: Diagnosis not present

## 2019-05-06 DIAGNOSIS — G2581 Restless legs syndrome: Secondary | ICD-10-CM | POA: Diagnosis not present

## 2019-05-06 DIAGNOSIS — I519 Heart disease, unspecified: Secondary | ICD-10-CM | POA: Diagnosis not present

## 2019-05-06 DIAGNOSIS — I509 Heart failure, unspecified: Secondary | ICD-10-CM | POA: Diagnosis not present

## 2019-05-06 DIAGNOSIS — I4891 Unspecified atrial fibrillation: Secondary | ICD-10-CM | POA: Diagnosis not present

## 2019-05-06 DIAGNOSIS — E785 Hyperlipidemia, unspecified: Secondary | ICD-10-CM | POA: Diagnosis not present

## 2019-05-06 DIAGNOSIS — I10 Essential (primary) hypertension: Secondary | ICD-10-CM | POA: Diagnosis not present

## 2019-05-29 DEATH — deceased

## 2019-09-21 IMAGING — DX DG CHEST 2V
2 series · 2 of 2 positions shown · non-contrast
Comparison: 08/23/2015

CLINICAL DATA: Short of breath and weakness. Recent completion of
antibiotics for urinary tract infection.

EXAM:
CHEST  2 VIEW

[chest ap]
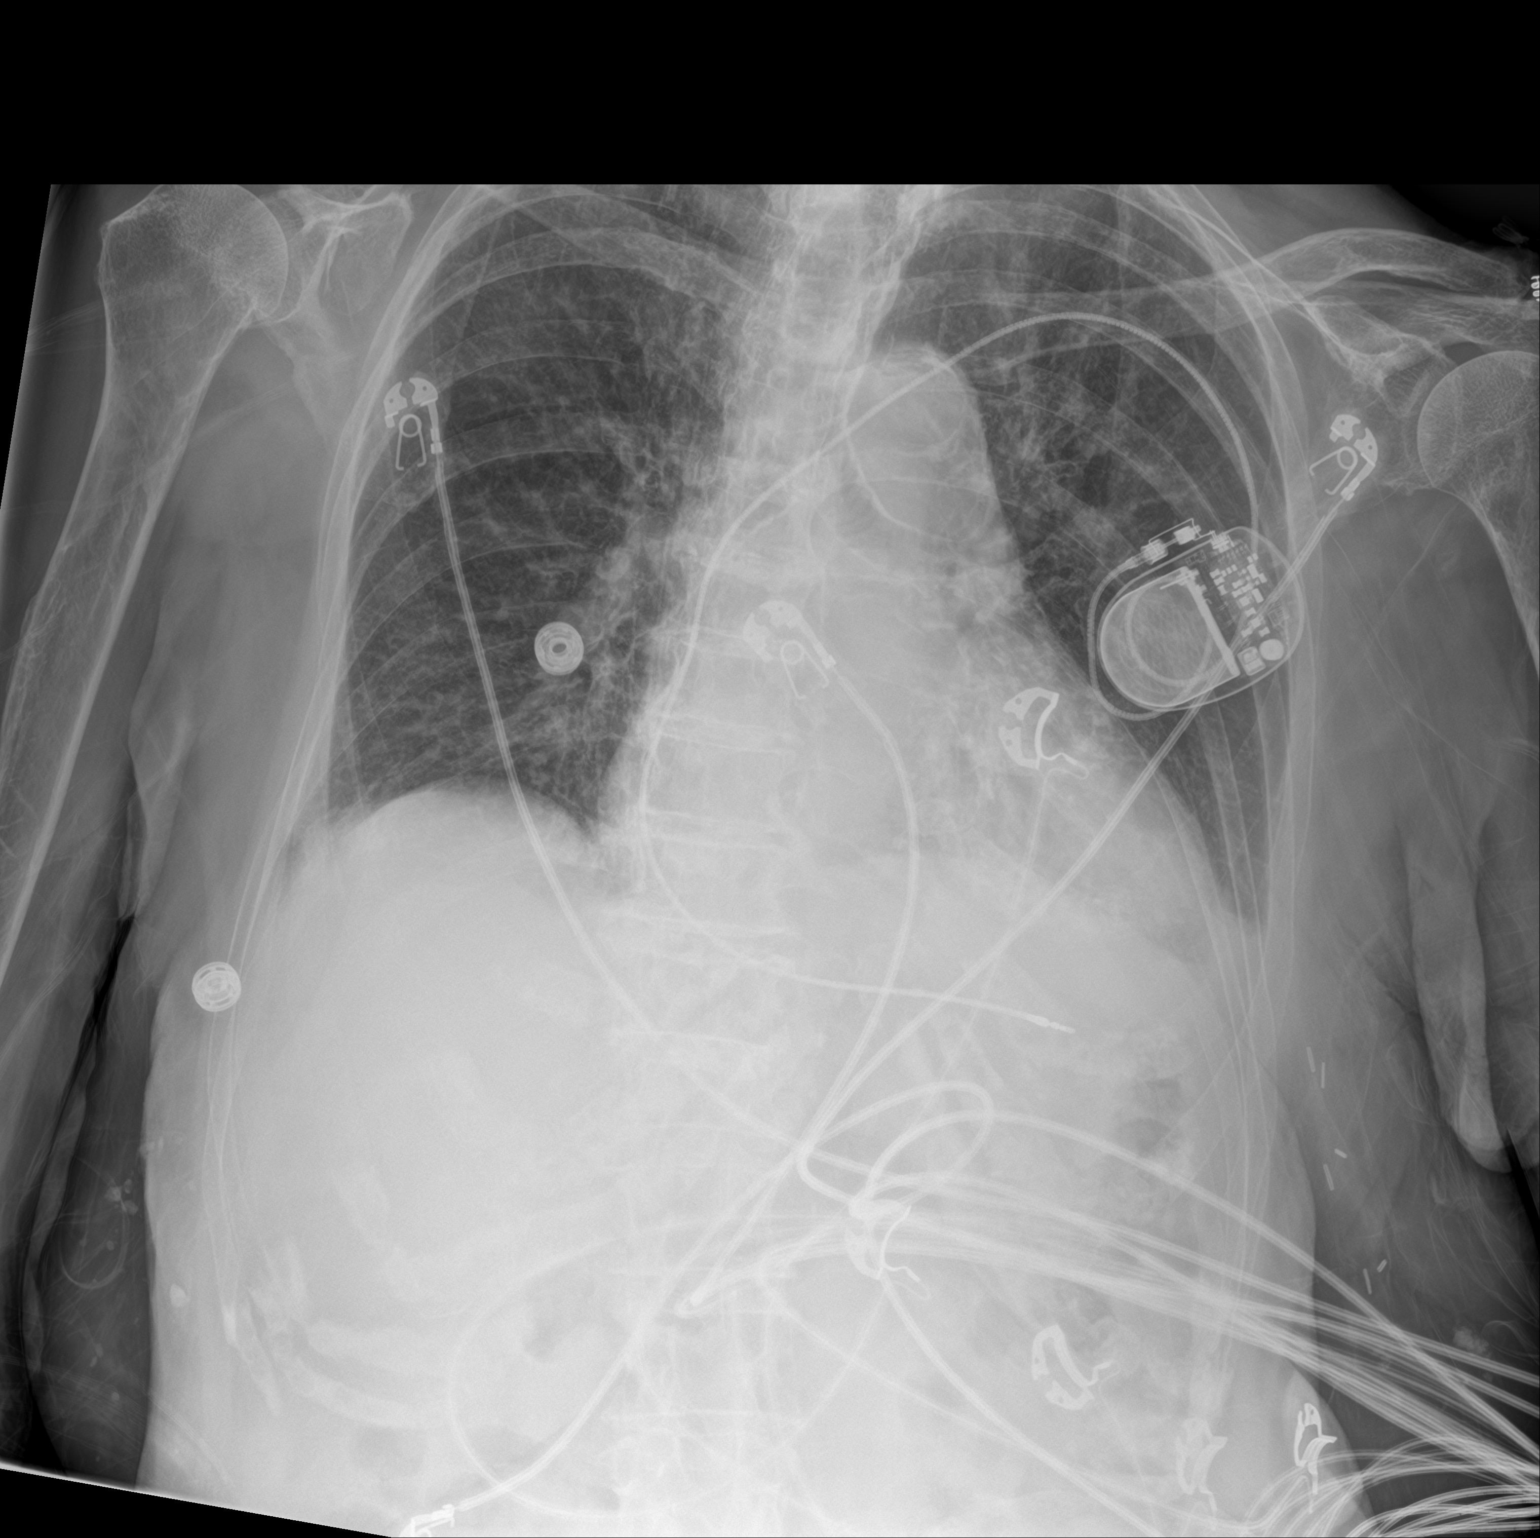

[chest lat]
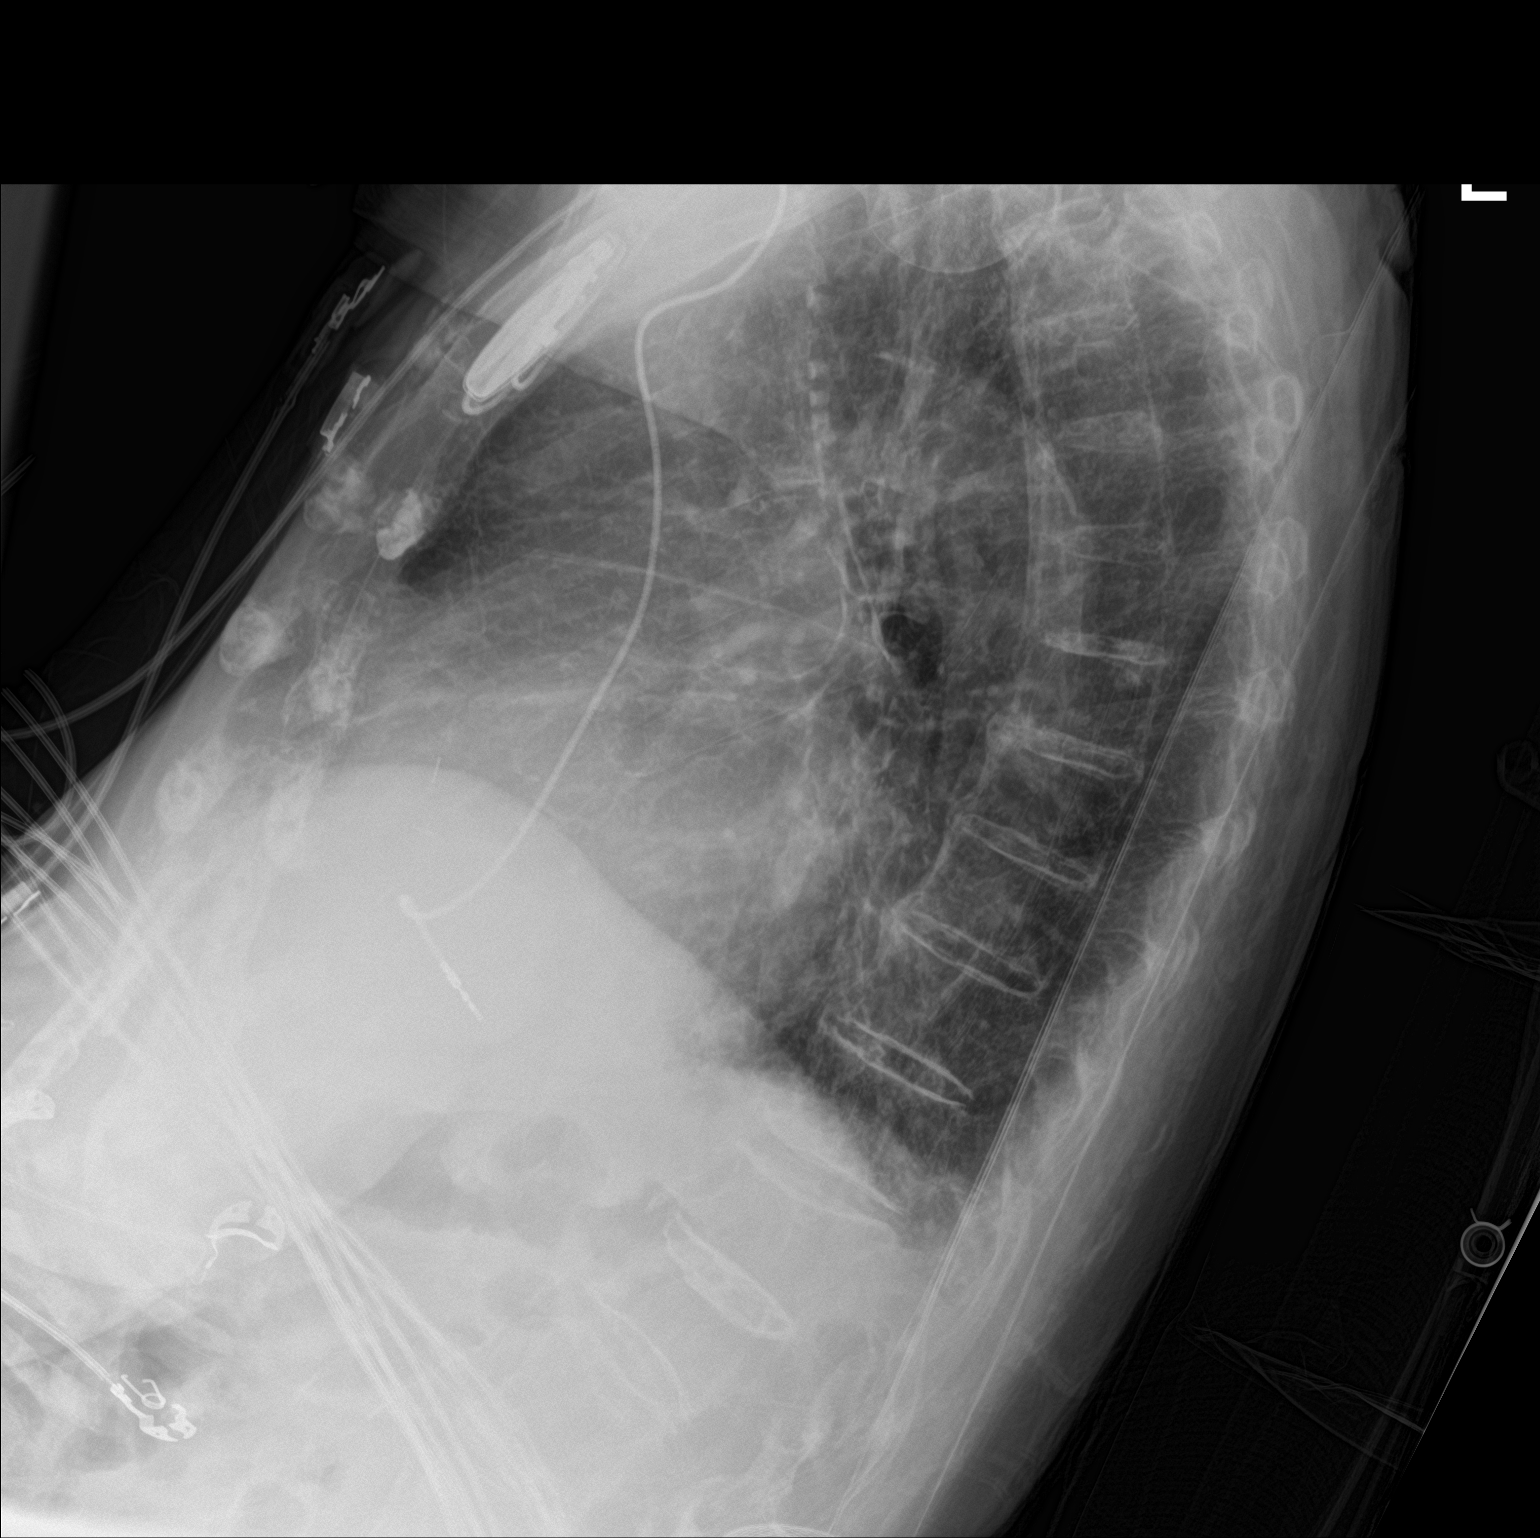

[2 of 2 positions shown; findings below may reference images not displayed]

FINDINGS: Cardiac silhouette is mildly enlarged. No mediastinal or hilar
masses. No convincing adenopathy.

Mildly thickened interstitial markings most evident in the
peripheral lung bases. No evidence of pneumonia. No convincing
pulmonary edema.

No pleural effusion or pneumothorax.

Left anterior chest wall single lead pacemaker is stable.

Skeletal structures are demineralized but grossly intact.
IMPRESSION: No acute cardiopulmonary disease.
